# Patient Record
Sex: Female | Born: 1945 | Race: Black or African American | Hispanic: No | Smoking: Former smoker
Health system: Southern US, Community
[De-identification: ages and names within clinical notes are randomized; demographics above are authoritative.]

## PROBLEM LIST (undated history)

## (undated) DIAGNOSIS — Z9289 Personal history of other medical treatment: Secondary | ICD-10-CM

## (undated) DIAGNOSIS — F0390 Unspecified dementia without behavioral disturbance: Secondary | ICD-10-CM

## (undated) DIAGNOSIS — G35D Multiple sclerosis, unspecified: Secondary | ICD-10-CM

## (undated) DIAGNOSIS — I10 Essential (primary) hypertension: Secondary | ICD-10-CM

## (undated) DIAGNOSIS — G35 Multiple sclerosis: Secondary | ICD-10-CM

## (undated) HISTORY — DX: Essential (primary) hypertension: I10

## (undated) HISTORY — PX: TONSILLECTOMY: SUR1361

## (undated) HISTORY — DX: Unspecified dementia, unspecified severity, without behavioral disturbance, psychotic disturbance, mood disturbance, and anxiety: F03.90

## (undated) HISTORY — PX: CYSTECTOMY: SUR359

---

## 1997-05-10 ENCOUNTER — Ambulatory Visit (HOSPITAL_COMMUNITY): Admission: RE | Admit: 1997-05-10 | Discharge: 1997-05-10 | Payer: Self-pay | Admitting: Obstetrics and Gynecology

## 1999-10-16 ENCOUNTER — Encounter: Payer: Self-pay | Admitting: Obstetrics and Gynecology

## 1999-10-16 ENCOUNTER — Ambulatory Visit (HOSPITAL_COMMUNITY): Admission: RE | Admit: 1999-10-16 | Discharge: 1999-10-16 | Payer: Self-pay | Admitting: Obstetrics and Gynecology

## 1999-12-19 ENCOUNTER — Other Ambulatory Visit: Admission: RE | Admit: 1999-12-19 | Discharge: 1999-12-19 | Payer: Self-pay | Admitting: Obstetrics and Gynecology

## 2001-11-17 ENCOUNTER — Encounter: Payer: Self-pay | Admitting: Emergency Medicine

## 2001-11-17 ENCOUNTER — Emergency Department (HOSPITAL_COMMUNITY): Admission: EM | Admit: 2001-11-17 | Discharge: 2001-11-17 | Payer: Self-pay | Admitting: Emergency Medicine

## 2005-09-11 ENCOUNTER — Encounter: Admission: RE | Admit: 2005-09-11 | Discharge: 2005-09-11 | Payer: Self-pay | Admitting: Internal Medicine

## 2005-11-18 ENCOUNTER — Ambulatory Visit: Payer: Self-pay | Admitting: Internal Medicine

## 2005-11-18 LAB — CONVERTED CEMR LAB
ALT: 23 units/L (ref 0–40)
AST: 27 units/L (ref 0–37)
Albumin: 4.1 g/dL (ref 3.5–5.2)
Basophils Absolute: 0.1 10*3/uL (ref 0.0–0.1)
Chloride: 106 meq/L (ref 96–112)
Chol/HDL Ratio, serum: 3.5
Cholesterol: 149 mg/dL (ref 0–200)
Creatinine, Ser: 0.9 mg/dL (ref 0.4–1.2)
Glomerular Filtration Rate, Af Am: 82 mL/min/{1.73_m2}
Glucose, Bld: 97 mg/dL (ref 70–99)
HDL: 42.1 mg/dL (ref >39.0)
Hgb A1c MFr Bld: 5.7 % (ref 4.6–6.0)
Lymphocytes Relative: 36.8 % (ref 12.0–46.0)
MCHC: 33.2 g/dL (ref 30.0–36.0)
MCV: 92.2 fL (ref 78.0–100.0)
Neutro Abs: 3.6 10*3/uL (ref 1.4–7.7)
Platelets: 179 10*3/uL (ref 150–400)
Potassium: 4.4 meq/L (ref 3.5–5.1)
RBC: 4.16 M/uL (ref 3.87–5.11)
Sodium: 141 meq/L (ref 135–145)
Total Bilirubin: 1.4 mg/dL — ABNORMAL HIGH (ref 0.3–1.2)
Triglyceride fasting, serum: 54 mg/dL (ref 0–149)

## 2006-01-02 ENCOUNTER — Ambulatory Visit: Payer: Self-pay

## 2006-02-05 ENCOUNTER — Ambulatory Visit: Payer: Self-pay | Admitting: Internal Medicine

## 2006-09-17 ENCOUNTER — Encounter: Admission: RE | Admit: 2006-09-17 | Discharge: 2006-09-17 | Payer: Self-pay | Admitting: Internal Medicine

## 2006-11-11 ENCOUNTER — Encounter: Payer: Self-pay | Admitting: *Deleted

## 2006-11-11 DIAGNOSIS — I739 Peripheral vascular disease, unspecified: Secondary | ICD-10-CM

## 2006-11-11 DIAGNOSIS — E7800 Pure hypercholesterolemia, unspecified: Secondary | ICD-10-CM | POA: Insufficient documentation

## 2006-11-11 DIAGNOSIS — Z9189 Other specified personal risk factors, not elsewhere classified: Secondary | ICD-10-CM | POA: Insufficient documentation

## 2007-02-25 ENCOUNTER — Telehealth: Payer: Self-pay | Admitting: Internal Medicine

## 2007-11-12 ENCOUNTER — Encounter: Admission: RE | Admit: 2007-11-12 | Discharge: 2007-11-12 | Payer: Self-pay | Admitting: Internal Medicine

## 2008-01-13 ENCOUNTER — Other Ambulatory Visit: Admission: RE | Admit: 2008-01-13 | Discharge: 2008-01-13 | Payer: Self-pay | Admitting: Gynecology

## 2008-01-13 ENCOUNTER — Ambulatory Visit: Payer: Self-pay | Admitting: Women's Health

## 2008-01-13 ENCOUNTER — Encounter: Payer: Self-pay | Admitting: Women's Health

## 2008-05-19 ENCOUNTER — Emergency Department (HOSPITAL_COMMUNITY): Admission: EM | Admit: 2008-05-19 | Discharge: 2008-05-19 | Payer: Self-pay | Admitting: Family Medicine

## 2008-05-23 ENCOUNTER — Emergency Department (HOSPITAL_COMMUNITY): Admission: EM | Admit: 2008-05-23 | Discharge: 2008-05-23 | Payer: Self-pay | Admitting: Emergency Medicine

## 2008-05-29 ENCOUNTER — Emergency Department (HOSPITAL_COMMUNITY): Admission: EM | Admit: 2008-05-29 | Discharge: 2008-05-29 | Payer: Self-pay | Admitting: Emergency Medicine

## 2008-06-04 ENCOUNTER — Emergency Department (HOSPITAL_COMMUNITY): Admission: EM | Admit: 2008-06-04 | Discharge: 2008-06-04 | Payer: Self-pay | Admitting: Family Medicine

## 2008-11-22 ENCOUNTER — Encounter: Admission: RE | Admit: 2008-11-22 | Discharge: 2008-11-22 | Payer: Self-pay | Admitting: Infectious Diseases

## 2009-11-23 ENCOUNTER — Ambulatory Visit (HOSPITAL_COMMUNITY): Admission: RE | Admit: 2009-11-23 | Discharge: 2009-11-23 | Payer: Self-pay | Admitting: Family Medicine

## 2010-02-25 ENCOUNTER — Encounter: Payer: Self-pay | Admitting: Internal Medicine

## 2010-09-05 ENCOUNTER — Other Ambulatory Visit (HOSPITAL_COMMUNITY): Payer: Self-pay | Admitting: Internal Medicine

## 2010-09-05 DIAGNOSIS — Z1231 Encounter for screening mammogram for malignant neoplasm of breast: Secondary | ICD-10-CM

## 2010-11-25 ENCOUNTER — Ambulatory Visit (HOSPITAL_COMMUNITY): Payer: Self-pay | Attending: Internal Medicine

## 2011-12-01 ENCOUNTER — Other Ambulatory Visit: Payer: Self-pay | Admitting: Family Medicine

## 2011-12-01 DIAGNOSIS — Z1231 Encounter for screening mammogram for malignant neoplasm of breast: Secondary | ICD-10-CM

## 2011-12-29 ENCOUNTER — Other Ambulatory Visit: Payer: Self-pay | Admitting: Internal Medicine

## 2011-12-29 ENCOUNTER — Ambulatory Visit
Admission: RE | Admit: 2011-12-29 | Discharge: 2011-12-29 | Disposition: A | Discharge status: Home / Self Care | Payer: Medicare Other | Source: Ambulatory Visit | Attending: Family Medicine | Admitting: Family Medicine

## 2011-12-29 DIAGNOSIS — Z1231 Encounter for screening mammogram for malignant neoplasm of breast: Secondary | ICD-10-CM

## 2012-04-15 ENCOUNTER — Inpatient Hospital Stay (HOSPITAL_COMMUNITY): Payer: Medicare Other

## 2012-04-15 ENCOUNTER — Encounter (HOSPITAL_COMMUNITY): Payer: Self-pay

## 2012-04-15 ENCOUNTER — Inpatient Hospital Stay (HOSPITAL_COMMUNITY)
Admission: EM | Admit: 2012-04-15 | Discharge: 2012-04-21 | DRG: 059 | Disposition: A | Discharge status: Rehabilitation Facility | Payer: Medicare Other | Attending: Internal Medicine | Admitting: Internal Medicine

## 2012-04-15 ENCOUNTER — Emergency Department (HOSPITAL_COMMUNITY): Payer: Medicare Other

## 2012-04-15 DIAGNOSIS — E7800 Pure hypercholesterolemia, unspecified: Secondary | ICD-10-CM

## 2012-04-15 DIAGNOSIS — I739 Peripheral vascular disease, unspecified: Secondary | ICD-10-CM

## 2012-04-15 DIAGNOSIS — G378 Other specified demyelinating diseases of central nervous system: Principal | ICD-10-CM | POA: Diagnosis present

## 2012-04-15 DIAGNOSIS — G35 Multiple sclerosis: Secondary | ICD-10-CM

## 2012-04-15 DIAGNOSIS — G459 Transient cerebral ischemic attack, unspecified: Secondary | ICD-10-CM

## 2012-04-15 DIAGNOSIS — R531 Weakness: Secondary | ICD-10-CM

## 2012-04-15 DIAGNOSIS — R634 Abnormal weight loss: Secondary | ICD-10-CM

## 2012-04-15 DIAGNOSIS — K5939 Other megacolon: Secondary | ICD-10-CM

## 2012-04-15 DIAGNOSIS — R933 Abnormal findings on diagnostic imaging of other parts of digestive tract: Secondary | ICD-10-CM

## 2012-04-15 DIAGNOSIS — Z8673 Personal history of transient ischemic attack (TIA), and cerebral infarction without residual deficits: Secondary | ICD-10-CM

## 2012-04-15 DIAGNOSIS — G379 Demyelinating disease of central nervous system, unspecified: Secondary | ICD-10-CM

## 2012-04-15 DIAGNOSIS — R140 Abdominal distension (gaseous): Secondary | ICD-10-CM

## 2012-04-15 DIAGNOSIS — Z9189 Other specified personal risk factors, not elsewhere classified: Secondary | ICD-10-CM

## 2012-04-15 HISTORY — DX: Personal history of other medical treatment: Z92.89

## 2012-04-15 LAB — DIFFERENTIAL
Basophils Absolute: 0 10*3/uL (ref 0.0–0.1)
Lymphocytes Relative: 32 % (ref 12–46)
Lymphs Abs: 2.6 10*3/uL (ref 0.7–4.0)
Neutro Abs: 4.7 10*3/uL (ref 1.7–7.7)

## 2012-04-15 LAB — URINE MICROSCOPIC-ADD ON

## 2012-04-15 LAB — COMPREHENSIVE METABOLIC PANEL
ALT: 25 U/L (ref 0–35)
AST: 27 U/L (ref 0–37)
Alkaline Phosphatase: 46 U/L (ref 39–117)
CO2: 26 mEq/L (ref 19–32)
Chloride: 108 mEq/L (ref 96–112)
GFR calc Af Amer: 88 mL/min — ABNORMAL LOW (ref >90)
GFR calc non Af Amer: 76 mL/min — ABNORMAL LOW (ref >90)
Glucose, Bld: 83 mg/dL (ref 70–99)
Potassium: 4.4 mEq/L (ref 3.5–5.1)
Sodium: 142 mEq/L (ref 135–145)
Total Bilirubin: 0.4 mg/dL (ref 0.3–1.2)

## 2012-04-15 LAB — CBC
MCV: 92.7 fL (ref 78.0–100.0)
Platelets: 181 10*3/uL (ref 150–400)
RBC: 3.55 MIL/uL — ABNORMAL LOW (ref 3.87–5.11)
RDW: 14 % (ref 11.5–15.5)
WBC: 8 10*3/uL (ref 4.0–10.5)

## 2012-04-15 LAB — URINALYSIS, ROUTINE W REFLEX MICROSCOPIC
Bilirubin Urine: NEGATIVE
Glucose, UA: NEGATIVE mg/dL
Hgb urine dipstick: NEGATIVE
Protein, ur: NEGATIVE mg/dL

## 2012-04-15 LAB — APTT: aPTT: 31 seconds (ref 24–37)

## 2012-04-15 LAB — PROTIME-INR: Prothrombin Time: 12.7 seconds (ref 11.6–15.2)

## 2012-04-15 LAB — GLUCOSE, CAPILLARY: Glucose-Capillary: 78 mg/dL (ref 70–99)

## 2012-04-15 LAB — TSH: TSH: 3.985 u[IU]/mL (ref 0.350–4.500)

## 2012-04-15 LAB — VITAMIN B12: Vitamin B-12: 493 pg/mL (ref 211–911)

## 2012-04-15 MED ORDER — SODIUM CHLORIDE 0.9 % IV SOLN
INTRAVENOUS | Status: DC
  Administered 2012-04-16 – 2012-04-17 (×2): via INTRAVENOUS

## 2012-04-15 MED ORDER — SODIUM CHLORIDE 0.9 % IV SOLN: INTRAVENOUS | Status: AC

## 2012-04-15 MED ORDER — ASPIRIN 325 MG PO TABS
325.0000 mg | ORAL_TABLET | Freq: Every day | ORAL | Status: DC
  Administered 2012-04-16 – 2012-04-21 (×5): 325 mg via ORAL
  Filled 2012-04-15 (×6): qty 1

## 2012-04-15 MED ORDER — ENOXAPARIN SODIUM 40 MG/0.4ML ~~LOC~~ SOLN
40.0000 mg | SUBCUTANEOUS | Status: DC
  Administered 2012-04-16 – 2012-04-18 (×3): 40 mg via SUBCUTANEOUS
  Filled 2012-04-15 (×3): qty 0.4

## 2012-04-15 NOTE — H&P (Signed)
[facility] History and Physical  Michelle Hodges UJW:[id number] DOB: 08-Nov-1945 DOA: 04/15/2012  Referring physician: ER PCP: Oliver Barre, MD   Chief Complaint: CVA HPI:  67 year old female who presents to the ER via EMS after co-workers were concerned about her peculiar behavior/weakness. Daughter reports that the patient has had worsening of her right lower extremity weakness that originally had an acute onset 2 years ago, but was never evaluated for and is described as dragging leg with ambulation. She's never had a stroke. In addition patient has been dropping things more than usual with hands, also right-sided. Today at work patient was noted to have an episode of disequilibrium and facial droop which has since resolved. Patient has never been evaluated by a cardiologist or a neurologist and daughter reports that they have made her appointments however she prefers herbal medicines and is non-compliant with appointments. She's also lost 30 pounds and has never had a colonoscopy. She feels that her abdomen is bloated, she has had diarrhea alternating with constipation. Patient denies any current change in vision, dizziness, chest pain, shortness of breath, dyspnea on exertion, decreased coordination.       Review of Systems: negative for the following  Constitutional: Denies fever, chills, diaphoresis, appetite change and fatigue.  HEENT: Denies photophobia, eye pain, redness, hearing loss, ear pain, congestion, sore throat, rhinorrhea, sneezing, mouth sores, trouble swallowing, neck pain, neck stiffness and tinnitus.  Respiratory: Denies SOB, DOE, cough, chest tightness, and wheezing.  Cardiovascular: Denies chest pain, palpitations and leg swelling.  Gastrointestinal: Denies nausea, vomiting, abdominal pain, diarrhea, constipation, blood in stool and abdominal distention.  Genitourinary: Denies dysuria, urgency, frequency, hematuria, flank pain and difficulty urinating.   Musculoskeletal: Denies myalgias, back pain, joint swelling, arthralgias and gait problem.  Skin: Denies pallor, rash and wound.  Neurological: Positive for facial asymmetry (resolved) and weakness. Negative for dizziness, syncope, light-headedness, numbness and headaches Hematological: Denies adenopathy. Easy bruising, personal or family bleeding history  Psychiatric/Behavioral: Denies suicidal ideation, mood changes, confusion, nervousness, sleep disturbance and agitation       Past Medical History  Diagnosis Date  . History of blood transfusion      Past Surgical History  Procedure Laterality Date  . Tonsillectomy        Social History:  reports that she has never smoked. She does not have any smokeless tobacco history on file. She reports that  drinks alcohol. She reports that she uses illicit drugs (Marijuana).    No Known Allergies  No family history on file.   Prior to Admission medications   Not on File     Physical Exam: Filed Vitals:   04/15/12 1355 04/15/12 1400 04/15/12 1430 04/15/12 1500  BP:      Pulse: 48 50 54 51  Temp:      Resp:  15 15 18   Height:      Weight:      SpO2: 100% 100% 100% 100%     Constitutional: Vital signs reviewed. Patient is a well-developed and well-nourished in no acute distress and cooperative with exam. Alert and oriented x3.  Patient not in visible distress  HENT:  Head: Normocephalic and atraumatic.  Eyes: Conjunctivae and EOM are normal. Pupils are equal, round, and reactive to light.  Neck: Normal range of motion. Neck supple. Normal carotid pulses, no hepatojugular reflux and no JVD present. Carotid bruit is not present.  No carotid bruits  Cardiovascular:  RRR, no aberrancy on auscultation, intact distal pulses no pitting  edema bilaterally.  Pulmonary/Chest: Effort normal and breath sounds normal. No respiratory distress.  Lungs clear to auscultation bilaterally  Abdominal: Soft. She exhibits no distension.  There is no tenderness.  Nonpulsatile aorta  Musculoskeletal: Normal range of motion. She exhibits no tenderness.  Neurological: She is alert and oriented to person, place, and time. She displays a negative Romberg sign.  Cranial nerves II through XII intact, normal coordination, , strength 5/5 bilaterally.  Skin: Skin is warm and dry. No pallor.  Psychiatric: She has a normal mood and affect. Her behavior is normal.        Labs on Admission:    Basic Metabolic Panel:  Recent Labs Lab 04/15/12 1345  NA 142  K 4.4  CL 108  CO2 26  GLUCOSE 83  BUN 16  CREATININE 0.80  CALCIUM 9.3   Liver Function Tests:  Recent Labs Lab 04/15/12 1345  AST 27  ALT 25  ALKPHOS 46  BILITOT 0.4  PROT 6.2  ALBUMIN 3.4*   No results found for this basename: LIPASE, AMYLASE,  in the last 168 hours No results found for this basename: AMMONIA,  in the last 168 hours CBC:  Recent Labs Lab 04/15/12 1345  WBC 8.0  NEUTROABS 4.7  HGB 10.9*  HCT 32.9*  MCV 92.7  PLT 181   Cardiac Enzymes:  Recent Labs Lab 04/15/12 1346  TROPONINI <0.30    BNP (last 3 results) No results found for this basename: PROBNP,  in the last 8760 hours    CBG:  Recent Labs Lab 04/15/12 1316  GLUCAP 78    Radiological Exams on Admission: Ct Head Wo Contrast  04/15/2012  *RADIOLOGY REPORT*  Clinical Data: Fall with head trauma.  Acting strange.  CT HEAD WITHOUT CONTRAST  Technique:  Contiguous axial images were obtained from the base of the skull through the vertex without contrast.  Comparison: None.  Findings: There are patchy and confluent areas of low attenuation in the periventricular deep white matter.  No evidence of acute hemorrhage, mass lesion, mass effect or hydrocephalus.  Scattered mucosal thickening or opacification of the paranasal sinuses. Mastoid air cells are clear.  IMPRESSION: Patchy and confluent areas of low attenuation in the periventricular deep white matter.  Findings are  likely due to chronic microvascular white matter ischemic changes. Focal area of acute or subacute infarction cannot be definitively excluded.   Original Report Authenticated By: Leanna Battles, M.D.     EKG: Independently reviewed.    Assessment/Plan   1. Probable CVA MRI done results pending at this time Admit for a full stroke workup 2-D echo Carotid Doppler PT OT evaluation We'll start the patient on aspirin   #2 weight loss-recommend CT scan tomorrow after the patient has received some IV hydration she needs a CT scan of the abdomen and pelvis and possibly referral for an outpatient colonoscopy   Code Status:   full Family Communication: bedside Disposition Plan: admit   Time spent: 70 mins   [facility] Pager [phone]  If 7PM-7AM, please contact night-coverage [email or web address] Password [facility] 04/15/2012, 5:37 PM

## 2012-04-15 NOTE — ED Notes (Signed)
According to patients daughter, a pan fell on pt's head on Saturday- 6 days ago. Today at work, pt's coworkers noticed patient not her normal self.

## 2012-04-15 NOTE — Consult Note (Addendum)
Referring Physician: Dr. Radford Pax    Chief Complaint: dizziness, right sided weakness  HPI: Michelle Hodges is an 67 y.o. female who has had right sided weakness and a limp for 2 years according to family. Upon awakening today at 0630, she noted that she had significant weakness of right side "draggin" her leg. She has had dizziness for about 2 weeks that has progressed in severity. About a week ago, she did hit her head on some type of pot in the kitchen, but she did have symptoms prior to that time. Her family has been trying to get her to see primary MD but she has resisted.   Date last known well: Patient had increased weakness of left side upon awakening 04/15/2012 at 0630, but has been symptomatic for weeks. Time last known well: Unable to determine  tPA Given: No: unable to determine   Past Medical History  Diagnosis Date  . History of blood transfusion     Past Surgical History  Procedure Laterality Date  . Tonsillectomy      Family History: strokes parents  Social History:  Patient denies tobacco, alcohol or illicit drug use.  Allergies: no known drug allergies  Current Facility-Administered Medications  Medication Dose Route Frequency Provider Last Rate Last Dose  . 0.9 %  sodium chloride infusion   Intravenous Continuous Richarda Overlie, MD       No current outpatient prescriptions on file.     ROS: History obtained from child and the patient  General ROS: negative for - chills, fatigue, fever, night sweats, weight gain or weight loss Psychological ROS: negative for - behavioral disorder, hallucinations, memory difficulties, mood swings or suicidal ideation Ophthalmic ROS: negative for - blurry vision, double vision, eye pain or loss of vision ENT ROS: negative for - epistaxis, nasal discharge, oral lesions, sore throat, tinnitus or vertigo Allergy and Immunology ROS: negative for - hives or itchy/watery eyes Hematological and Lymphatic ROS: negative for - bleeding  problems, bruising or swollen lymph nodes Endocrine ROS: negative for - galactorrhea, hair pattern changes, polydipsia/polyuria or temperature intolerance Respiratory ROS: negative for - cough, hemoptysis, shortness of breath or wheezing Cardiovascular ROS: negative for - chest pain, dyspnea on exertion, edema or irregular heartbeat Gastrointestinal ROS: negative for - abdominal pain, diarrhea, hematemesis, nausea/vomiting or stool incontinence Genito-Urinary ROS: negative for - dysuria, hematuria, incontinence or urinary frequency/urgency Musculoskeletal ROS: negative for - joint swelling or muscular weakness Neurological ROS: as noted in HPI Dermatological ROS: negative for rash and skin lesion changes   Physical Examination: Blood pressure 139/97, pulse 51, temperature 98 F (36.7 C), resp. rate 18, height 5\' 8"  (1.727 m), weight 56.7 kg (125 lb), SpO2 100.00%.  Neurologic Examination: Mental Status: Alert, oriented, thought content appropriate.  Speech fluent without evidence of aphasia.  Able to follow 3 step commands without difficulty. Cranial Nerves: II: visual fields grossly normal, pupils equal, round, reactive to light and accommodation III,IV, VI: ptosis not present, extra-ocular motions intact bilaterally V,VII: smile symmetric, facial light touch sensation normal bilaterally VIII: hearing normal bilaterally IX,X: gag reflex present XI: trapezius strength/neck flexion strength normal bilaterally XII: tongue strength normal  Motor: Right : Upper extremity   5-/5    Left:     Upper extremity   5/5  Lower extremity   4-/5     Lower extremity   5/5 Tone and bulk:normal tone throughout; no atrophy noted Sensory: Pinprick and light touch intact throughout, bilaterally Deep Tendon Reflexes: 2+ throughout with absent AJ's  bilaterally Plantars: Right: upoing   Left: upgoing Cerebellar: normal finger-to-nose   Results for orders placed during the hospital encounter of  04/15/12 (from the past 48 hour(s))  GLUCOSE, CAPILLARY     Status: None   Collection Time    04/15/12  1:16 PM      Result Value Range   Glucose-Capillary 78  70 - 99 mg/dL  PROTIME-INR     Status: None   Collection Time    04/15/12  1:45 PM      Result Value Range   Prothrombin Time 12.7  11.6 - 15.2 seconds   INR 0.96  0.00 - 1.49  APTT     Status: None   Collection Time    04/15/12  1:45 PM      Result Value Range   aPTT 31  24 - 37 seconds  CBC     Status: Abnormal   Collection Time    04/15/12  1:45 PM      Result Value Range   WBC 8.0  4.0 - 10.5 K/uL   RBC 3.55 (*) 3.87 - 5.11 MIL/uL   Hemoglobin 10.9 (*) 12.0 - 15.0 g/dL   HCT 40.9 (*) 81.1 - 91.4 %   MCV 92.7  78.0 - 100.0 fL   MCH 30.7  26.0 - 34.0 pg   MCHC 33.1  30.0 - 36.0 g/dL   RDW 78.2  95.6 - 21.3 %   Platelets 181  150 - 400 K/uL  DIFFERENTIAL     Status: None   Collection Time    04/15/12  1:45 PM      Result Value Range   Neutrophils Relative 59  43 - 77 %   Neutro Abs 4.7  1.7 - 7.7 K/uL   Lymphocytes Relative 32  12 - 46 %   Lymphs Abs 2.6  0.7 - 4.0 K/uL   Monocytes Relative 5  3 - 12 %   Monocytes Absolute 0.4  0.1 - 1.0 K/uL   Eosinophils Relative 4  0 - 5 %   Eosinophils Absolute 0.3  0.0 - 0.7 K/uL   Basophils Relative 0  0 - 1 %   Basophils Absolute 0.0  0.0 - 0.1 K/uL  COMPREHENSIVE METABOLIC PANEL     Status: Abnormal   Collection Time    04/15/12  1:45 PM      Result Value Range   Sodium 142  135 - 145 mEq/L   Potassium 4.4  3.5 - 5.1 mEq/L   Chloride 108  96 - 112 mEq/L   CO2 26  19 - 32 mEq/L   Glucose, Bld 83  70 - 99 mg/dL   BUN 16  6 - 23 mg/dL   Creatinine, Ser 0.86  0.50 - 1.10 mg/dL   Calcium 9.3  8.4 - 57.8 mg/dL   Total Protein 6.2  6.0 - 8.3 g/dL   Albumin 3.4 (*) 3.5 - 5.2 g/dL   AST 27  0 - 37 U/L   ALT 25  0 - 35 U/L   Alkaline Phosphatase 46  39 - 117 U/L   Total Bilirubin 0.4  0.3 - 1.2 mg/dL   GFR calc non Af Amer 76 (*) >90 mL/min   GFR calc Af Amer 88  (*) >90 mL/min   Comment:            The eGFR has been calculated     using the CKD EPI equation.     This calculation has not  been     validated in all clinical     situations.     eGFR's persistently     <90 mL/min signify     possible Chronic Kidney Disease.  TROPONIN I     Status: None   Collection Time    04/15/12  1:46 PM      Result Value Range   Troponin I <0.30  <0.30 ng/mL   Comment:            Due to the release kinetics of cTnI,     a negative result within the first hours     of the onset of symptoms does not rule out     myocardial infarction with certainty.     If myocardial infarction is still suspected,     repeat the test at appropriate intervals.  URINALYSIS, ROUTINE W REFLEX MICROSCOPIC     Status: Abnormal   Collection Time    04/15/12  2:28 PM      Result Value Range   Color, Urine YELLOW  YELLOW   APPearance CLEAR  CLEAR   Specific Gravity, Urine 1.010  1.005 - 1.030   pH 6.0  5.0 - 8.0   Glucose, UA NEGATIVE  NEGATIVE mg/dL   Hgb urine dipstick NEGATIVE  NEGATIVE   Bilirubin Urine NEGATIVE  NEGATIVE   Ketones, ur NEGATIVE  NEGATIVE mg/dL   Protein, ur NEGATIVE  NEGATIVE mg/dL   Urobilinogen, UA 0.2  0.0 - 1.0 mg/dL   Nitrite NEGATIVE  NEGATIVE   Leukocytes, UA TRACE (*) NEGATIVE  URINE MICROSCOPIC-ADD ON     Status: None   Collection Time    04/15/12  2:28 PM      Result Value Range   Squamous Epithelial / LPF RARE  RARE   WBC, UA 0-2  <3 WBC/hpf   Ct Head Wo Contrast  04/15/2012  *RADIOLOGY REPORT*  Clinical Data: Fall with head trauma.  Acting strange.  CT HEAD WITHOUT CONTRAST  Technique:  Contiguous axial images were obtained from the base of the skull through the vertex without contrast.  Comparison: None.  Findings: There are patchy and confluent areas of low attenuation in the periventricular deep white matter.  No evidence of acute hemorrhage, mass lesion, mass effect or hydrocephalus.  Scattered mucosal thickening or opacification of the  paranasal sinuses. Mastoid air cells are clear.  IMPRESSION: Patchy and confluent areas of low attenuation in the periventricular deep white matter.  Findings are likely due to chronic microvascular white matter ischemic changes. Focal area of acute or subacute infarction cannot be definitively excluded.   Original Report Authenticated By: Leanna Battles, M.D.    Job Founds, MBA, MHA [facility] Pager [phone]  Patient seen and examined.  Clinical course and management discussed.  Necessary edits performed.  I agree with the above.  Assessment and plan of care developed and discussed below.     Assessment: 67 y.o. female presenting with right sided complaints that have spanned about 2 years.  There has been some recent worsening but after further conversation it is unclear if all of this actually happened just today.  After review of the MRI, again this does not show an acute event.  Patient does clearly have some ischemic disease, though and has not sought for work up in the past.  Would likely benefit from a full work up.    Stroke Risk Factors - none  Plan: 1. HgbA1c, fasting lipid panel 2. PT consult, OT consult,  Speech consult 3. Echocardiogram 4. Carotid dopplers 5. Prophylactic therapy-ASA 325mg  daily 6. Risk factor modification 7. Telemetry monitoring 8. Frequent neuro checks   Thana Farr, MD [facility] [phone]  04/15/2012  7:05 PM

## 2012-04-15 NOTE — ED Provider Notes (Signed)
History     CSN: [id number]  Arrival date & time 04/15/12  1233   First MD Initiated Contact with Patient 04/15/12 1256      Chief Complaint  Patient presents with  . Weakness    (Consider location/radiation/quality/duration/timing/severity/associated sxs/prior treatment) HPI Comments: Ms. Michelle Hodges is a 67 year old female who presents to the ER via EMS after co-workers were concerned about her peculiar behavior/weakness. Hx obtained from both daughter and pt who appear to be reliable.  They report that the patient has had gradually worsening right lower extremity weakness that originally had an acute onset 2 years ago, but was never evaluated for and is described as dragging leg with ambulation.  In addition patient has been dropping things more than usual with hands, also right-sided.  Today at work patient was noted to have an episode of  disequilibrium and facial droop which has since resolved.  Patient has never been evaluated by a cardiologist or a neurologist and daughter reports that they have made her appointments however she prefers herbal medicines and is non-compliant with appointments.  Patient denies any current change in vision, dizziness, chest pain, shortness of breath, dyspnea on exertion, decreased coordination.  Patient is a 67 y.o. female presenting with weakness.  Weakness Associated symptoms include weakness. Pertinent negatives include no abdominal pain, chest pain, chills, congestion, fever, headaches or numbness.    Past Medical History  Diagnosis Date  . History of blood transfusion     Past Surgical History  Procedure Laterality Date  . Tonsillectomy      No family history on file.  History  Substance Use Topics  . Smoking status: Never Smoker   . Smokeless tobacco: Not on file  . Alcohol Use: Yes     Comment: Wine Occassionally    OB History   Grav Para Term Preterm Abortions TAB SAB Ect Mult Living                  Review of Systems   Constitutional: Negative for fever, chills and appetite change.  HENT: Negative for congestion.   Eyes: Negative for visual disturbance.  Respiratory: Negative for shortness of breath.   Cardiovascular: Negative for chest pain and leg swelling.  Gastrointestinal: Negative for abdominal pain.  Genitourinary: Negative for dysuria, urgency and frequency.  Neurological: Positive for facial asymmetry (resolved) and weakness. Negative for dizziness, syncope, light-headedness, numbness and headaches.  Psychiatric/Behavioral: Negative for confusion.  All other systems reviewed and are negative.    Allergies  Review of patient's allergies indicates no known allergies.  Home Medications  No current outpatient prescriptions on file.  BP 119/84  Pulse 53  Temp(Src) 98 F (36.7 C)  Resp 14  Ht 5\' 8"  (1.727 m)  Wt 125 lb (56.7 kg)  BMI 19.01 kg/m2  SpO2 100%  Physical Exam  Nursing note and vitals reviewed. Constitutional: She is oriented to person, place, and time. She appears well-developed and well-nourished. No distress.  Patient not in visible distress  HENT:  Head: Normocephalic and atraumatic.  Eyes: Conjunctivae and EOM are normal. Pupils are equal, round, and reactive to light.  Neck: Normal range of motion. Neck supple. Normal carotid pulses, no hepatojugular reflux and no JVD present. Carotid bruit is not present.  No carotid bruits  Cardiovascular:  RRR, no aberrancy on auscultation, intact distal pulses no pitting edema bilaterally.  Pulmonary/Chest: Effort normal and breath sounds normal. No respiratory distress.  Lungs clear to auscultation bilaterally  Abdominal: Soft. She exhibits  no distension. There is no tenderness.  Nonpulsatile aorta  Musculoskeletal: Normal range of motion. She exhibits no tenderness.  Neurological: She is alert and oriented to person, place, and time. She displays a negative Romberg sign.  Cranial nerves II through XII intact, normal  coordination, , strength 5/5 bilaterally.  Skin: Skin is warm and dry. No pallor.  Psychiatric: She has a normal mood and affect. Her behavior is normal.    ED Course  Procedures (including critical care time)  Labs Reviewed  GLUCOSE, CAPILLARY  PROTIME-INR  APTT  CBC  DIFFERENTIAL  COMPREHENSIVE METABOLIC PANEL  TROPONIN I  URINALYSIS, ROUTINE W REFLEX MICROSCOPIC   No results found.   No diagnosis found.   Date: 04/15/2012  Rate: 51  Rhythm: normal sinus rhythm  QRS Axis: normal  Intervals: normal  ST/T Wave abnormalities: normal  Conduction Disutrbances: none  Narrative Interpretation:   Old EKG Reviewed: No old ECG     MDM  Patient is a 67 year old female with no significant past medical history that presents emergency department after coworkers believed her to be behaving peculiar with change in ambulation and facial droop.  Patient has never been evaluated by a cardiologist or a neurologist.  Patient's daughter reports that mother has been walking with a right lower from a foot drag for approximately one year and she believes that her mother possibly had a stroke, however she was never evaluated for it.  Based on his story my concern is that patient may have had an ischemic event a year ago and then recently a TIA that has since resolved.  It is my recommendation that the patient is admitted for full workup including carotid Dopplers MRI/MRA.  Patient and daughter are both agreeable to plan.  Case discussed with Dr. be in attending who also agrees with disposition.The patient appears reasonably stabilized for admission considering the current resources, flow, and capabilities available in the ED at this time, and I doubt any other [facility] requiring further screening and/or treatment in the ED prior to admission.        Jaci Carrel, [place] 04/15/12 1512

## 2012-04-15 NOTE — ED Notes (Signed)
Patient transported to MRI 

## 2012-04-15 NOTE — ED Notes (Addendum)
PA- Lisette Paz at bedside.

## 2012-04-15 NOTE — ED Provider Notes (Signed)
Medical screening examination/treatment/procedure(s) were conducted as a shared visit with non-physician practitioner(s) and myself.  I personally evaluated the patient during the encounter   Nelia Shi, MD 04/15/12 1517

## 2012-04-15 NOTE — ED Notes (Addendum)
According to EMS pt fell on ice approximately 1 week ago. Today at work, right leg weakness and left sided facial droop.

## 2012-04-16 DIAGNOSIS — M6281 Muscle weakness (generalized): Secondary | ICD-10-CM

## 2012-04-16 DIAGNOSIS — R531 Weakness: Secondary | ICD-10-CM

## 2012-04-16 DIAGNOSIS — Z8673 Personal history of transient ischemic attack (TIA), and cerebral infarction without residual deficits: Secondary | ICD-10-CM

## 2012-04-16 DIAGNOSIS — G379 Demyelinating disease of central nervous system, unspecified: Secondary | ICD-10-CM

## 2012-04-16 DIAGNOSIS — G35 Multiple sclerosis: Secondary | ICD-10-CM

## 2012-04-16 DIAGNOSIS — I369 Nonrheumatic tricuspid valve disorder, unspecified: Secondary | ICD-10-CM

## 2012-04-16 LAB — LIPID PANEL
Cholesterol: 154 mg/dL (ref 0–200)
HDL: 56 mg/dL (ref >39)
Total CHOL/HDL Ratio: 2.8 RATIO
Triglycerides: 71 mg/dL (ref <150)

## 2012-04-16 LAB — RAPID URINE DRUG SCREEN, HOSP PERFORMED
Amphetamines: NOT DETECTED
Barbiturates: NOT DETECTED
Tetrahydrocannabinol: POSITIVE — AB

## 2012-04-16 LAB — HEMOGLOBIN A1C
Hgb A1c MFr Bld: 5.6 % (ref <5.7)
Mean Plasma Glucose: 114 mg/dL (ref <117)

## 2012-04-16 MED ORDER — PANTOPRAZOLE SODIUM 40 MG IV SOLR
40.0000 mg | INTRAVENOUS | Status: DC
  Administered 2012-04-16 – 2012-04-17 (×2): 40 mg via INTRAVENOUS
  Filled 2012-04-16 (×4): qty 40

## 2012-04-16 MED ORDER — SODIUM CHLORIDE 0.9 % IV SOLN
1000.0000 mg | INTRAVENOUS | Status: DC
  Administered 2012-04-16 – 2012-04-19 (×4): 1000 mg via INTRAVENOUS
  Filled 2012-04-16 (×5): qty 8

## 2012-04-16 NOTE — Progress Notes (Addendum)
Subjective: Patient awake and alert.  No new complaints.  Reports that she is still not at baseline.  After review of the MRI of the cervical spine patient noted to have multiple areas of focal cord abnormality.  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  Spent 60 minutes in conversation with the patient and family discussing process, further treatment plans, diagnosis and prognosis.  At this time patient does not consent to an LP. Echocardiogram and carotid doppler are unremarkable.  LDL 84.  A1c normal at 5.6.  Objective: Current vital signs: BP 127/67  Pulse 54  Temp(Src) 98.3 F (36.8 C) (Oral)  Resp 16  Ht 5\' 8"  (1.727 m)  Wt 56.7 kg (125 lb)  BMI 19.01 kg/m2  SpO2 100% Vital signs in last 24 hours: Temp:  [98 F (36.7 C)-98.3 F (36.8 C)] 98.3 F (36.8 C) (03/14 0631) Pulse Rate:  [54-65] 54 (03/14 0740) Resp:  [16] 16 (03/14 0740) BP: (118-132)/(67-81) 127/67 mmHg (03/14 0740) SpO2:  [99 %-100 %] 100 % (03/14 0740)  Intake/Output from previous day: 03/13 0701 - 03/14 0700 In: 240 [P.O.:240] Out: -  Intake/Output this shift:   Nutritional status: Cardiac  Neurologic Exam: Mental Status:  Alert, oriented, thought content appropriate. Speech fluent without evidence of aphasia. Able to follow 3 step commands without difficulty.  Cranial Nerves:  II: visual fields grossly normal, pupils equal, round, reactive to light and accommodation  III,IV, VI: ptosis not present, extra-ocular motions intact bilaterally  V,VII: smile symmetric, facial light touch sensation normal bilaterally  VIII: hearing normal bilaterally  IX,X: gag reflex present  XI: trapezius strength/neck flexion strength normal bilaterally  XII: tongue strength normal  Motor:  Right : Upper extremity 5-/5          Left: Upper extremity 5/5   Lower extremity 4-/5       Lower extremity 5/5  Tone and bulk:normal tone throughout; no atrophy noted   Sensory: Pinprick and light touch intact throughout, bilaterally  Deep Tendon Reflexes: 2+ throughout with absent AJ's bilaterally  Plantars:  Right: upoing    Left: upgoing  Cerebellar:  normal finger-to-nose   Lab Results: Basic Metabolic Panel:  Recent Labs Lab 04/15/12 1345  NA 142  K 4.4  CL 108  CO2 26  GLUCOSE 83  BUN 16  CREATININE 0.80  CALCIUM 9.3    Liver Function Tests:  Recent Labs Lab 04/15/12 1345  AST 27  ALT 25  ALKPHOS 46  BILITOT 0.4  PROT 6.2  ALBUMIN 3.4*   No results found for this basename: LIPASE, AMYLASE,  in the last 168 hours No results found for this basename: AMMONIA,  in the last 168 hours  CBC:  Recent Labs Lab 04/15/12 1345  WBC 8.0  NEUTROABS 4.7  HGB 10.9*  HCT 32.9*  MCV 92.7  PLT 181    Cardiac Enzymes:  Recent Labs Lab 04/15/12 1346  TROPONINI <0.30    Lipid Panel:  Recent Labs Lab 04/16/12 0529  CHOL 154  TRIG 71  HDL 56  CHOLHDL 2.8  VLDL 14  LDLCALC 84    CBG:  Recent Labs Lab 04/15/12 1316  GLUCAP 78    Microbiology: No results found for this or any previous visit.  Coagulation Studies:  Recent Labs  04/15/12 1345  LABPROT 12.7  INR 0.96    Imaging: Ct Head Wo Contrast  04/15/2012  *RADIOLOGY REPORT*  Clinical Data: Fall with  head trauma.  Acting strange.  CT HEAD WITHOUT CONTRAST  Technique:  Contiguous axial images were obtained from the base of the skull through the vertex without contrast.  Comparison: None.  Findings: There are patchy and confluent areas of low attenuation in the periventricular deep white matter.  No evidence of acute hemorrhage, mass lesion, mass effect or hydrocephalus.  Scattered mucosal thickening or opacification of the paranasal sinuses. Mastoid air cells are clear.  IMPRESSION: Patchy and confluent areas of low attenuation in the periventricular deep white matter.  Findings are likely due to chronic microvascular white matter ischemic changes. Focal  area of acute or subacute infarction cannot be definitively excluded.   Original Report Authenticated By: Leanna Battles, M.D.    Mr [facility] Wo Contrast  04/15/2012  *RADIOLOGY REPORT*  Clinical Data:  Right-sided weakness.  Episode of disequilibrium and facial droop at work today.  MRI HEAD WITHOUT CONTRAST MRA HEAD WITHOUT CONTRAST  Technique:  Multiplanar, multiecho pulse sequences of the brain and surrounding structures were obtained without intravenous contrast. Angiographic images of the head were obtained using MRA technique without contrast.  Comparison:  CT head without contrast 04/15/2012.  MRI HEAD  Findings:  The diffusion weighted images demonstrate no areas of focal restricted diffusion to suggest acute or subacute infarction. Signal in the posterior right frontal lobe is compatible with remote infarct and T2 shine through.  Extensive periventricular subcortical white matter changes are evident bilaterally. Remote lacunar infarcts are present in the cerebellum.  A remote subcortical infarct is present in the posterior left frontal or parietal lobe.  Flow is present in the major intracranial arteries.  The globes and orbits are intact.  A fluid level is present in the left maxillary sinus.  Mild circumferential mucosal thickening is present in the maxillary sinuses bilaterally.  There is scattered mucosal disease throughout the ethmoid air cells.  A large polyp or mucous retention cyst is present in the left maxillary sinus.  The mastoid air cells are clear.  IMPRESSION:  1.  No evidence for acute or subacute infarction. 2.  Diffuse white matter disease compatible with chronic microvascular ischemia. 3.  Remote bilateral cortical and subcortical infarcts. 4.  Moderate sinus disease with evidence for acute sinusitis in the left maxillary sinus.  MRA HEAD  Findings: The internal carotid arteries are within normal limits from the high cervical segments through the ICA termini bilaterally.  The A1 and  M1 segments are normal.  The anterior communicating artery is patent.  MCA bifurcations are normal bilaterally.  Mild small vessel disease is less than expected for the extent of white matter change.  The vertebral arteries are codominant.  The left PICA origin is visualized and within normal limits.  The basilar artery is normal. Both posterior cerebral arteries originate from the basilar tip. There is mild attenuation of PCA branch vessels.  IMPRESSION:  1.  Mild distal small vessel disease. 2.  No significant proximal stenosis, aneurysm, or branch vessel occlusion.   Original Report Authenticated By: Marin Roberts, M.D.    Mr Brain Wo Contrast  04/15/2012  *RADIOLOGY REPORT*  Clinical Data:  Right-sided weakness.  Episode of disequilibrium and facial droop at work today.  MRI HEAD WITHOUT CONTRAST MRA HEAD WITHOUT CONTRAST  Technique:  Multiplanar, multiecho pulse sequences of the brain and surrounding structures were obtained without intravenous contrast. Angiographic images of the head were obtained using MRA technique without contrast.  Comparison:  CT head without contrast 04/15/2012.  MRI HEAD  Findings:  The diffusion weighted images demonstrate no areas of focal restricted diffusion to suggest acute or subacute infarction. Signal in the posterior right frontal lobe is compatible with remote infarct and T2 shine through.  Extensive periventricular subcortical white matter changes are evident bilaterally. Remote lacunar infarcts are present in the cerebellum.  A remote subcortical infarct is present in the posterior left frontal or parietal lobe.  Flow is present in the major intracranial arteries.  The globes and orbits are intact.  A fluid level is present in the left maxillary sinus.  Mild circumferential mucosal thickening is present in the maxillary sinuses bilaterally.  There is scattered mucosal disease throughout the ethmoid air cells.  A large polyp or mucous retention cyst is present in  the left maxillary sinus.  The mastoid air cells are clear.  IMPRESSION:  1.  No evidence for acute or subacute infarction. 2.  Diffuse white matter disease compatible with chronic microvascular ischemia. 3.  Remote bilateral cortical and subcortical infarcts. 4.  Moderate sinus disease with evidence for acute sinusitis in the left maxillary sinus.  MRA HEAD  Findings: The internal carotid arteries are within normal limits from the high cervical segments through the ICA termini bilaterally.  The A1 and M1 segments are normal.  The anterior communicating artery is patent.  MCA bifurcations are normal bilaterally.  Mild small vessel disease is less than expected for the extent of white matter change.  The vertebral arteries are codominant.  The left PICA origin is visualized and within normal limits.  The basilar artery is normal. Both posterior cerebral arteries originate from the basilar tip. There is mild attenuation of PCA branch vessels.  IMPRESSION:  1.  Mild distal small vessel disease. 2.  No significant proximal stenosis, aneurysm, or branch vessel occlusion.   Original Report Authenticated By: Marin Roberts, M.D.    Mr Cervical Spine Wo Contrast  04/16/2012  *RADIOLOGY REPORT*  Clinical Data: Right-sided upper and lower extremity weakness. Episode of disequilibrium at work today.  MRI CERVICAL SPINE WITHOUT CONTRAST  Technique:  Multiplanar and multiecho pulse sequences of the cervical spine, to include the craniocervical junction and cervicothoracic junction, were obtained according to standard protocol without intravenous contrast.  Comparison: MRI brain from the same day.  Findings: Multiple areas of cord signal abnormality are evident.  A 10 mm far right lateral segment is noted at the level of C3.  More posteriorly in the right dorsal column is an area of T2 hyperintensity at C4, measuring 11 mm cephalocaudad.  A short segment is seen along the far left lateral aspect of the cord at C4- 5.   Marrow signal is somewhat heterogeneous without a discrete lesion. There is straightening of the normal cervical lordosis.  The craniocervical junction is within normal limits.  Sinus disease is again noted.  C2-3:  Negative.  C3-4:  Mild uncovertebral disease is present.  A disc osteophyte complex contributes to mild central and left foraminal narrowing.  C4-5:  A focal central disc protrusion contacts the ventral surface the cord.  The foramina are patent bilaterally.  C5-6:  Moderate uncovertebral disease is evident bilaterally.  This results in moderate left and mild right foraminal stenosis.  C6-7:  A broad-based disc osteophyte complex is evident. Uncovertebral disease is noted bilaterally.  Mild to moderate central and bilateral foraminal stenosis is evident.  C7-T1:  Left-sided uncovertebral disease results in mild left foraminal stenosis.  The central canal is patent.  IMPRESSION:  1.  At least  three and likely four discrete focal areas of cord signal abnormality are present.  In light of the MRI brain findings, these are most concerning for a demyelinating process. Transverse myelitis or a metastatic process is considered less likely. 2.  Mild cervical spondylosis as described. The most significant level is C6-7 with mild moderate central and bilateral foraminal stenosis.   Original Report Authenticated By: Marin Roberts, M.D.     Medications:  I have reviewed the patient's current medications. Scheduled: . aspirin  325 mg Oral Daily  . enoxaparin (LOVENOX) injection  40 mg Subcutaneous Q24H    Assessment/Plan: After review of films patient likely with MS.  Has had acute worsening.  Does not consent to LP at this time but may benefit from steroid treatment for 3-5 days.  Risks and benefits discussed with patient.  Will start Solumedrol today.    Recommendations: 1.  Solumedrol 1gm IV daily X 3 days.  Will re-evaluate at that time and determine whether further Solumedrol necessary. 2.   Protonix 40mg  daily.      LOS: 1 day   Thana Farr, MD [facility] [phone] 04/16/2012  3:26 PM

## 2012-04-16 NOTE — Progress Notes (Signed)
  Echocardiogram 2D Echocardiogram has been performed.  Michelle Hodges, Michelle Hodges 04/16/2012, 11:27 AM

## 2012-04-16 NOTE — Progress Notes (Signed)
Rehab Admissions Coordinator Note:  Patient was screened by Trish Mage for appropriateness for an Inpatient Acute Rehab Consult.  Noted PT recommending CIR.  At this time, we are recommending Inpatient Rehab consult.  Trish Mage 04/16/2012, 2:19 PM  I can be reached at [phone].
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
## 2012-04-16 NOTE — Progress Notes (Signed)
UR COMPLETED  

## 2012-04-16 NOTE — Progress Notes (Signed)
VASCULAR LAB PRELIMINARY  PRELIMINARY  PRELIMINARY  PRELIMINARY  Carotid duplex completed.    Preliminary report:  Bilateral:  No evidence of hemodynamically significant internal carotid artery stenosis.   Vertebral artery flow is antegrade.     SLAUGHTER, [place], RVS 04/16/2012, 11:14 AM

## 2012-04-16 NOTE — Progress Notes (Signed)
[facility]             Progress Note   Subjective: No acute complaints. On questioning has had RLE>RUE weakness for about 2 years and has never sought medical attention for this. Has difficulty grasping items intermittently.  Objective: Vital signs in last 24 hours: Temp:  [98 F (36.7 C)-98.3 F (36.8 C)] 98.3 F (36.8 C) (03/14 0631) Pulse Rate:  [54-65] 54 (03/14 0740) Resp:  [16] 16 (03/14 0740) BP: (118-132)/(67-81) 127/67 mmHg (03/14 0740) SpO2:  [99 %-100 %] 100 % (03/14 0740) Weight change:  Last BM Date: 04/15/12  Intake/Output from previous day: 03/13 0701 - 03/14 0700 In: 240 [P.O.:240] Out: -      Physical Exam: General: Alert, awake, oriented x3, in no acute distress. HEENT: No bruits, no goiter. Heart: Regular rate and rhythm, without murmurs, rubs, gallops. Lungs: Clear to auscultation bilaterally. Abdomen: Soft, nontender, nondistended, positive bowel sounds. Extremities: No clubbing cyanosis or edema with positive pedal pulses.    Lab Results: Basic Metabolic Panel:  Recent Labs  16/10/96 1345  NA 142  K 4.4  CL 108  CO2 26  GLUCOSE 83  BUN 16  CREATININE 0.80  CALCIUM 9.3   Liver Function Tests:  Recent Labs  04/15/12 1345  AST 27  ALT 25  ALKPHOS 46  BILITOT 0.4  PROT 6.2  ALBUMIN 3.4*   No results found for this basename: LIPASE, AMYLASE,  in the last 72 hours No results found for this basename: AMMONIA,  in the last 72 hours CBC:  Recent Labs  04/15/12 1345  WBC 8.0  NEUTROABS 4.7  HGB 10.9*  HCT 32.9*  MCV 92.7  PLT 181   Cardiac Enzymes:  Recent Labs  04/15/12 1346  TROPONINI <0.30   CBG:  Recent Labs  04/15/12 1316  GLUCAP 78   Hemoglobin A1C:  Recent Labs  04/16/12 0529  HGBA1C 5.6   Fasting Lipid Panel:  Recent Labs  04/16/12 0529  CHOL 154  HDL 56  LDLCALC 84  TRIG 71  CHOLHDL 2.8   Thyroid Function Tests:  Recent Labs  04/15/12 1721  TSH 3.985   Anemia  Panel:  Recent Labs  04/15/12 1721  VITAMINB12 493   Coagulation:  Recent Labs  04/15/12 1345  LABPROT 12.7  INR 0.96   Urine Drug Screen: Drugs of Abuse     Component Value Date/Time   LABOPIA NONE DETECTED 04/16/2012 0540   COCAINSCRNUR NONE DETECTED 04/16/2012 0540   LABBENZ NONE DETECTED 04/16/2012 0540   AMPHETMU NONE DETECTED 04/16/2012 0540   THCU POSITIVE* 04/16/2012 0540   LABBARB NONE DETECTED 04/16/2012 0540    Urinalysis:  Recent Labs  04/15/12 1428  COLORURINE YELLOW  LABSPEC 1.010  PHURINE 6.0  GLUCOSEU NEGATIVE  HGBUR NEGATIVE  BILIRUBINUR NEGATIVE  KETONESUR NEGATIVE  PROTEINUR NEGATIVE  UROBILINOGEN 0.2  NITRITE NEGATIVE  LEUKOCYTESUR TRACE*    Studies/Results: Ct Head Wo Contrast  04/15/2012  *RADIOLOGY REPORT*  Clinical Data: Fall with head trauma.  Acting strange.  CT HEAD WITHOUT CONTRAST  Technique:  Contiguous axial images were obtained from the base of the skull through the vertex without contrast.  Comparison: None.  Findings: There are patchy and confluent areas of low attenuation in the periventricular deep white matter.  No evidence of acute hemorrhage, mass lesion, mass effect or hydrocephalus.  Scattered mucosal thickening or opacification of the paranasal sinuses. Mastoid air cells are clear.  IMPRESSION: Patchy and confluent areas of low attenuation  in the periventricular deep white matter.  Findings are likely due to chronic microvascular white matter ischemic changes. Focal area of acute or subacute infarction cannot be definitively excluded.   Original Report Authenticated By: Leanna Battles, M.D.    Mr [facility] Wo Contrast  04/15/2012  *RADIOLOGY REPORT*  Clinical Data:  Right-sided weakness.  Episode of disequilibrium and facial droop at work today.  MRI HEAD WITHOUT CONTRAST MRA HEAD WITHOUT CONTRAST  Technique:  Multiplanar, multiecho pulse sequences of the brain and surrounding structures were obtained without intravenous contrast.  Angiographic images of the head were obtained using MRA technique without contrast.  Comparison:  CT head without contrast 04/15/2012.  MRI HEAD  Findings:  The diffusion weighted images demonstrate no areas of focal restricted diffusion to suggest acute or subacute infarction. Signal in the posterior right frontal lobe is compatible with remote infarct and T2 shine through.  Extensive periventricular subcortical white matter changes are evident bilaterally. Remote lacunar infarcts are present in the cerebellum.  A remote subcortical infarct is present in the posterior left frontal or parietal lobe.  Flow is present in the major intracranial arteries.  The globes and orbits are intact.  A fluid level is present in the left maxillary sinus.  Mild circumferential mucosal thickening is present in the maxillary sinuses bilaterally.  There is scattered mucosal disease throughout the ethmoid air cells.  A large polyp or mucous retention cyst is present in the left maxillary sinus.  The mastoid air cells are clear.  IMPRESSION:  1.  No evidence for acute or subacute infarction. 2.  Diffuse white matter disease compatible with chronic microvascular ischemia. 3.  Remote bilateral cortical and subcortical infarcts. 4.  Moderate sinus disease with evidence for acute sinusitis in the left maxillary sinus.  MRA HEAD  Findings: The internal carotid arteries are within normal limits from the high cervical segments through the ICA termini bilaterally.  The A1 and M1 segments are normal.  The anterior communicating artery is patent.  MCA bifurcations are normal bilaterally.  Mild small vessel disease is less than expected for the extent of white matter change.  The vertebral arteries are codominant.  The left PICA origin is visualized and within normal limits.  The basilar artery is normal. Both posterior cerebral arteries originate from the basilar tip. There is mild attenuation of PCA branch vessels.  IMPRESSION:  1.  Mild distal  small vessel disease. 2.  No significant proximal stenosis, aneurysm, or branch vessel occlusion.   Original Report Authenticated By: Marin Roberts, M.D.    Mr Brain Wo Contrast  04/15/2012  *RADIOLOGY REPORT*  Clinical Data:  Right-sided weakness.  Episode of disequilibrium and facial droop at work today.  MRI HEAD WITHOUT CONTRAST MRA HEAD WITHOUT CONTRAST  Technique:  Multiplanar, multiecho pulse sequences of the brain and surrounding structures were obtained without intravenous contrast. Angiographic images of the head were obtained using MRA technique without contrast.  Comparison:  CT head without contrast 04/15/2012.  MRI HEAD  Findings:  The diffusion weighted images demonstrate no areas of focal restricted diffusion to suggest acute or subacute infarction. Signal in the posterior right frontal lobe is compatible with remote infarct and T2 shine through.  Extensive periventricular subcortical white matter changes are evident bilaterally. Remote lacunar infarcts are present in the cerebellum.  A remote subcortical infarct is present in the posterior left frontal or parietal lobe.  Flow is present in the major intracranial arteries.  The globes and orbits are intact.  A fluid level is present in the left maxillary sinus.  Mild circumferential mucosal thickening is present in the maxillary sinuses bilaterally.  There is scattered mucosal disease throughout the ethmoid air cells.  A large polyp or mucous retention cyst is present in the left maxillary sinus.  The mastoid air cells are clear.  IMPRESSION:  1.  No evidence for acute or subacute infarction. 2.  Diffuse white matter disease compatible with chronic microvascular ischemia. 3.  Remote bilateral cortical and subcortical infarcts. 4.  Moderate sinus disease with evidence for acute sinusitis in the left maxillary sinus.  MRA HEAD  Findings: The internal carotid arteries are within normal limits from the high cervical segments through the ICA  termini bilaterally.  The A1 and M1 segments are normal.  The anterior communicating artery is patent.  MCA bifurcations are normal bilaterally.  Mild small vessel disease is less than expected for the extent of white matter change.  The vertebral arteries are codominant.  The left PICA origin is visualized and within normal limits.  The basilar artery is normal. Both posterior cerebral arteries originate from the basilar tip. There is mild attenuation of PCA branch vessels.  IMPRESSION:  1.  Mild distal small vessel disease. 2.  No significant proximal stenosis, aneurysm, or branch vessel occlusion.   Original Report Authenticated By: Marin Roberts, M.D.    Mr Cervical Spine Wo Contrast  04/16/2012  *RADIOLOGY REPORT*  Clinical Data: Right-sided upper and lower extremity weakness. Episode of disequilibrium at work today.  MRI CERVICAL SPINE WITHOUT CONTRAST  Technique:  Multiplanar and multiecho pulse sequences of the cervical spine, to include the craniocervical junction and cervicothoracic junction, were obtained according to standard protocol without intravenous contrast.  Comparison: MRI brain from the same day.  Findings: Multiple areas of cord signal abnormality are evident.  A 10 mm far right lateral segment is noted at the level of C3.  More posteriorly in the right dorsal column is an area of T2 hyperintensity at C4, measuring 11 mm cephalocaudad.  A short segment is seen along the far left lateral aspect of the cord at C4- 5.  Marrow signal is somewhat heterogeneous without a discrete lesion. There is straightening of the normal cervical lordosis.  The craniocervical junction is within normal limits.  Sinus disease is again noted.  C2-3:  Negative.  C3-4:  Mild uncovertebral disease is present.  A disc osteophyte complex contributes to mild central and left foraminal narrowing.  C4-5:  A focal central disc protrusion contacts the ventral surface the cord.  The foramina are patent bilaterally.   C5-6:  Moderate uncovertebral disease is evident bilaterally.  This results in moderate left and mild right foraminal stenosis.  C6-7:  A broad-based disc osteophyte complex is evident. Uncovertebral disease is noted bilaterally.  Mild to moderate central and bilateral foraminal stenosis is evident.  C7-T1:  Left-sided uncovertebral disease results in mild left foraminal stenosis.  The central canal is patent.  IMPRESSION:  1.  At least three and likely four discrete focal areas of cord signal abnormality are present.  In light of the MRI brain findings, these are most concerning for a demyelinating process. Transverse myelitis or a metastatic process is considered less likely. 2.  Mild cervical spondylosis as described. The most significant level is C6-7 with mild moderate central and bilateral foraminal stenosis.   Original Report Authenticated By: Marin Roberts, M.D.     Medications: Scheduled Meds: . aspirin  325 mg Oral Daily  .  enoxaparin (LOVENOX) injection  40 mg Subcutaneous Q24H   Continuous Infusions: . sodium chloride 75 mL/hr at 04/16/12 0044   PRN Meds:.  Assessment/Plan:  Active Problems:   Demyelinating disease of central nervous system   History of CVA (cerebrovascular accident)   Weakness of right side of body   Right-sided weakness -MRI without CVA. -It appears she has a demyelinating CNS disorder ?MS. -Neuro following. Await their recommendations. -PT is recommending CIR. -Will request CIR consult.  Time spent coordinating care: 35 minutes.   LOS: 1 day   [facility] Pager: [phone] 04/16/2012, 3:27 PM

## 2012-04-17 ENCOUNTER — Inpatient Hospital Stay (HOSPITAL_COMMUNITY): Payer: Medicare Other

## 2012-04-17 DIAGNOSIS — R634 Abnormal weight loss: Secondary | ICD-10-CM

## 2012-04-17 DIAGNOSIS — R140 Abdominal distension (gaseous): Secondary | ICD-10-CM

## 2012-04-17 LAB — GLUCOSE, CAPILLARY
Glucose-Capillary: 162 mg/dL — ABNORMAL HIGH (ref 70–99)
Glucose-Capillary: 156 mg/dL — ABNORMAL HIGH (ref 70–99)
Glucose-Capillary: 159 mg/dL — ABNORMAL HIGH (ref 70–99)
Glucose-Capillary: 158 mg/dL — ABNORMAL HIGH (ref 70–99)

## 2012-04-17 MED ORDER — IOHEXOL 300 MG/ML  SOLN
80.0000 mL | Freq: Once | INTRAMUSCULAR | Status: AC | PRN
  Administered 2012-04-17: 80 mL via INTRAVENOUS

## 2012-04-17 MED ORDER — IOHEXOL 300 MG/ML  SOLN
25.0000 mL | INTRAMUSCULAR | Status: AC
  Administered 2012-04-17 (×2): 25 mL via ORAL

## 2012-04-17 NOTE — Progress Notes (Signed)
Pt. Made aware that she is not to eat anything from now until after abdominal CT scan.

## 2012-04-17 NOTE — Progress Notes (Signed)
Pt. Up in chair, tolerating well, family members at bedside.  No complaints at this time.  States she is still willing to have LP done.
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
## 2012-04-17 NOTE — Progress Notes (Signed)
Subjective: Patient reports that she is feeling stronger today.  Is s/p 1 dose of Solumedrol.  Does not report any side effects.  Continues to decline LP.  Objective: Current vital signs: BP 120/77  Pulse 73  Temp(Src) 98.3 F (36.8 C) (Oral)  Resp 16  Ht 5\' 8"  (1.727 m)  Wt 56.7 kg (125 lb)  BMI 19.01 kg/m2  SpO2 95% Vital signs in last 24 hours: Temp:  [97.8 F (36.6 C)-98.6 F (37 C)] 98.3 F (36.8 C) (03/15 0600) Pulse Rate:  [60-75] 73 (03/15 0600) Resp:  [16-18] 16 (03/15 0600) BP: (119-124)/(67-83) 120/77 mmHg (03/15 0600) SpO2:  [94 %-100 %] 95 % (03/15 0600)  Intake/Output from previous day:   Intake/Output this shift:   Nutritional status: Cardiac  Neurologic Exam: Mental Status:  Alert, oriented, thought content appropriate. Speech fluent without evidence of aphasia. Able to follow 3 step commands without difficulty.  Cranial Nerves:  II: visual fields grossly normal, pupils equal, round, reactive to light and accommodation  III,IV, VI: ptosis not present, extra-ocular motions intact bilaterally  V,VII: smile symmetric, facial light touch sensation normal bilaterally  VIII: hearing normal bilaterally  IX,X: gag reflex present  XI: trapezius strength/neck flexion strength normal bilaterally  XII: tongue strength normal  Motor:  Right : Upper extremity 5-/5         Left: Upper extremity 5/5   Lower extremity 5-/5       Lower extremity 5/5  Tone and bulk:normal tone throughout; no atrophy noted  Sensory: Pinprick and light touch intact throughout, bilaterally  Deep Tendon Reflexes: 2+ throughout with absent AJ's bilaterally  Plantars:  Right: upoing   Left: upgoing  Cerebellar:  normal finger-to-nose; dysmetric heel to shin on the right Gait: deliberate with episodes of loss of balance   Lab Results: Basic Metabolic Panel:  Recent Labs Lab 04/15/12 1345  NA 142  K 4.4  CL 108  CO2 26  GLUCOSE 83  BUN 16  CREATININE 0.80  CALCIUM 9.3     Liver Function Tests:  Recent Labs Lab 04/15/12 1345  AST 27  ALT 25  ALKPHOS 46  BILITOT 0.4  PROT 6.2  ALBUMIN 3.4*   No results found for this basename: LIPASE, AMYLASE,  in the last 168 hours No results found for this basename: AMMONIA,  in the last 168 hours  CBC:  Recent Labs Lab 04/15/12 1345  WBC 8.0  NEUTROABS 4.7  HGB 10.9*  HCT 32.9*  MCV 92.7  PLT 181    Cardiac Enzymes:  Recent Labs Lab 04/15/12 1346  TROPONINI <0.30    Lipid Panel:  Recent Labs Lab 04/16/12 0529  CHOL 154  TRIG 71  HDL 56  CHOLHDL 2.8  VLDL 14  LDLCALC 84    CBG:  Recent Labs Lab 04/15/12 1316 04/16/12 2204  GLUCAP 78 172*    Microbiology: No results found for this or any previous visit.  Coagulation Studies:  Recent Labs  04/15/12 1345  LABPROT 12.7  INR 0.96    Imaging: Ct Head Wo Contrast  04/15/2012  *RADIOLOGY REPORT*  Clinical Data: Fall with head trauma.  Acting strange.  CT HEAD WITHOUT CONTRAST  Technique:  Contiguous axial images were obtained from the base of the skull through the vertex without contrast.  Comparison: None.  Findings: There are patchy and confluent areas of low attenuation in the periventricular deep white matter.  No evidence of acute hemorrhage, mass lesion, mass effect or hydrocephalus.  Scattered mucosal thickening  or opacification of the paranasal sinuses. Mastoid air cells are clear.  IMPRESSION: Patchy and confluent areas of low attenuation in the periventricular deep white matter.  Findings are likely due to chronic microvascular white matter ischemic changes. Focal area of acute or subacute infarction cannot be definitively excluded.   Original Report Authenticated By: Leanna Battles, M.D.    Mr [facility] Wo Contrast  04/15/2012  *RADIOLOGY REPORT*  Clinical Data:  Right-sided weakness.  Episode of disequilibrium and facial droop at work today.  MRI HEAD WITHOUT CONTRAST MRA HEAD WITHOUT CONTRAST  Technique:   Multiplanar, multiecho pulse sequences of the brain and surrounding structures were obtained without intravenous contrast. Angiographic images of the head were obtained using MRA technique without contrast.  Comparison:  CT head without contrast 04/15/2012.  MRI HEAD  Findings:  The diffusion weighted images demonstrate no areas of focal restricted diffusion to suggest acute or subacute infarction. Signal in the posterior right frontal lobe is compatible with remote infarct and T2 shine through.  Extensive periventricular subcortical white matter changes are evident bilaterally. Remote lacunar infarcts are present in the cerebellum.  A remote subcortical infarct is present in the posterior left frontal or parietal lobe.  Flow is present in the major intracranial arteries.  The globes and orbits are intact.  A fluid level is present in the left maxillary sinus.  Mild circumferential mucosal thickening is present in the maxillary sinuses bilaterally.  There is scattered mucosal disease throughout the ethmoid air cells.  A large polyp or mucous retention cyst is present in the left maxillary sinus.  The mastoid air cells are clear.  IMPRESSION:  1.  No evidence for acute or subacute infarction. 2.  Diffuse white matter disease compatible with chronic microvascular ischemia. 3.  Remote bilateral cortical and subcortical infarcts. 4.  Moderate sinus disease with evidence for acute sinusitis in the left maxillary sinus.  MRA HEAD  Findings: The internal carotid arteries are within normal limits from the high cervical segments through the ICA termini bilaterally.  The A1 and M1 segments are normal.  The anterior communicating artery is patent.  MCA bifurcations are normal bilaterally.  Mild small vessel disease is less than expected for the extent of white matter change.  The vertebral arteries are codominant.  The left PICA origin is visualized and within normal limits.  The basilar artery is normal. Both posterior  cerebral arteries originate from the basilar tip. There is mild attenuation of PCA branch vessels.  IMPRESSION:  1.  Mild distal small vessel disease. 2.  No significant proximal stenosis, aneurysm, or branch vessel occlusion.   Original Report Authenticated By: Marin Roberts, M.D.    Mr Brain Wo Contrast  04/15/2012  *RADIOLOGY REPORT*  Clinical Data:  Right-sided weakness.  Episode of disequilibrium and facial droop at work today.  MRI HEAD WITHOUT CONTRAST MRA HEAD WITHOUT CONTRAST  Technique:  Multiplanar, multiecho pulse sequences of the brain and surrounding structures were obtained without intravenous contrast. Angiographic images of the head were obtained using MRA technique without contrast.  Comparison:  CT head without contrast 04/15/2012.  MRI HEAD  Findings:  The diffusion weighted images demonstrate no areas of focal restricted diffusion to suggest acute or subacute infarction. Signal in the posterior right frontal lobe is compatible with remote infarct and T2 shine through.  Extensive periventricular subcortical white matter changes are evident bilaterally. Remote lacunar infarcts are present in the cerebellum.  A remote subcortical infarct is present in the posterior left frontal  or parietal lobe.  Flow is present in the major intracranial arteries.  The globes and orbits are intact.  A fluid level is present in the left maxillary sinus.  Mild circumferential mucosal thickening is present in the maxillary sinuses bilaterally.  There is scattered mucosal disease throughout the ethmoid air cells.  A large polyp or mucous retention cyst is present in the left maxillary sinus.  The mastoid air cells are clear.  IMPRESSION:  1.  No evidence for acute or subacute infarction. 2.  Diffuse white matter disease compatible with chronic microvascular ischemia. 3.  Remote bilateral cortical and subcortical infarcts. 4.  Moderate sinus disease with evidence for acute sinusitis in the left maxillary sinus.   MRA HEAD  Findings: The internal carotid arteries are within normal limits from the high cervical segments through the ICA termini bilaterally.  The A1 and M1 segments are normal.  The anterior communicating artery is patent.  MCA bifurcations are normal bilaterally.  Mild small vessel disease is less than expected for the extent of white matter change.  The vertebral arteries are codominant.  The left PICA origin is visualized and within normal limits.  The basilar artery is normal. Both posterior cerebral arteries originate from the basilar tip. There is mild attenuation of PCA branch vessels.  IMPRESSION:  1.  Mild distal small vessel disease. 2.  No significant proximal stenosis, aneurysm, or branch vessel occlusion.   Original Report Authenticated By: Marin Roberts, M.D.    Mr Cervical Spine Wo Contrast  04/16/2012  *RADIOLOGY REPORT*  Clinical Data: Right-sided upper and lower extremity weakness. Episode of disequilibrium at work today.  MRI CERVICAL SPINE WITHOUT CONTRAST  Technique:  Multiplanar and multiecho pulse sequences of the cervical spine, to include the craniocervical junction and cervicothoracic junction, were obtained according to standard protocol without intravenous contrast.  Comparison: MRI brain from the same day.  Findings: Multiple areas of cord signal abnormality are evident.  A 10 mm far right lateral segment is noted at the level of C3.  More posteriorly in the right dorsal column is an area of T2 hyperintensity at C4, measuring 11 mm cephalocaudad.  A short segment is seen along the far left lateral aspect of the cord at C4- 5.  Marrow signal is somewhat heterogeneous without a discrete lesion. There is straightening of the normal cervical lordosis.  The craniocervical junction is within normal limits.  Sinus disease is again noted.  C2-3:  Negative.  C3-4:  Mild uncovertebral disease is present.  A disc osteophyte complex contributes to mild central and left foraminal  narrowing.  C4-5:  A focal central disc protrusion contacts the ventral surface the cord.  The foramina are patent bilaterally.  C5-6:  Moderate uncovertebral disease is evident bilaterally.  This results in moderate left and mild right foraminal stenosis.  C6-7:  A broad-based disc osteophyte complex is evident. Uncovertebral disease is noted bilaterally.  Mild to moderate central and bilateral foraminal stenosis is evident.  C7-T1:  Left-sided uncovertebral disease results in mild left foraminal stenosis.  The central canal is patent.  IMPRESSION:  1.  At least three and likely four discrete focal areas of cord signal abnormality are present.  In light of the MRI brain findings, these are most concerning for a demyelinating process. Transverse myelitis or a metastatic process is considered less likely. 2.  Mild cervical spondylosis as described. The most significant level is C6-7 with mild moderate central and bilateral foraminal stenosis.   Original Report Authenticated  By: Marin Roberts, M.D.     Medications:  I have reviewed the patient's current medications. Scheduled: . aspirin  325 mg Oral Daily  . enoxaparin (LOVENOX) injection  40 mg Subcutaneous Q24H  . methylPREDNISolone (SOLU-MEDROL) injection  1,000 mg Intravenous Q24H  . pantoprazole (PROTONIX) IV  40 mg Intravenous Q24H    Assessment/Plan: MS   Assessment:  Today will be day#2 of Solumedrol.  Patient reports some improvement but continues to have gait issues.     Plan: 1.  PT to continue while hospitalized and on an outpatient basis.              2.  Three days of Solumedrol anticipated.     LOS: 2 days   Thana Farr, MD [facility] [phone] 04/17/2012  10:27 AM

## 2012-04-17 NOTE — Progress Notes (Signed)
[facility]             Progress Note   Subjective: Says she feels a little stronger today. Has significant abdominal distention and what appears to be hepatomegaly on exam. Per daughters, this has been present for 1 year coupled with weight loss.  Objective: Vital signs in last 24 hours: Temp:  [97.7 F (36.5 C)-98.6 F (37 C)] 97.7 F (36.5 C) (03/15 1127) Pulse Rate:  [60-75] 60 (03/15 1127) Resp:  [16-18] 18 (03/15 1127) BP: (119-129)/(67-88) 129/88 mmHg (03/15 1127) SpO2:  [94 %-100 %] 99 % (03/15 1127) Weight change:  Last BM Date: 04/15/12  Intake/Output from previous day:       Physical Exam: General: Alert, awake, oriented x3, in no acute distress. HEENT: No bruits, no goiter. Heart: Regular rate and rhythm, without murmurs, rubs, gallops. Lungs: Clear to auscultation bilaterally. Abdomen: distended, hepatomegaly, +BS. Extremities: No clubbing cyanosis or edema with positive pedal pulses.    Lab Results: Basic Metabolic Panel:  Recent Labs  16/10/96 1345  NA 142  K 4.4  CL 108  CO2 26  GLUCOSE 83  BUN 16  CREATININE 0.80  CALCIUM 9.3   Liver Function Tests:  Recent Labs  04/15/12 1345  AST 27  ALT 25  ALKPHOS 46  BILITOT 0.4  PROT 6.2  ALBUMIN 3.4*   CBC:  Recent Labs  04/15/12 1345  WBC 8.0  NEUTROABS 4.7  HGB 10.9*  HCT 32.9*  MCV 92.7  PLT 181   Cardiac Enzymes:  Recent Labs  04/15/12 1346  TROPONINI <0.30   CBG:  Recent Labs  04/15/12 1316 04/16/12 2204  GLUCAP 78 172*   Hemoglobin A1C:  Recent Labs  04/16/12 0529  HGBA1C 5.6   Fasting Lipid Panel:  Recent Labs  04/16/12 0529  CHOL 154  HDL 56  LDLCALC 84  TRIG 71  CHOLHDL 2.8   Thyroid Function Tests:  Recent Labs  04/15/12 1721  TSH 3.985   Anemia Panel:  Recent Labs  04/15/12 1721  VITAMINB12 493   Coagulation:  Recent Labs  04/15/12 1345  LABPROT 12.7  INR 0.96   Urine Drug Screen: Drugs of Abuse      Component Value Date/Time   LABOPIA NONE DETECTED 04/16/2012 0540   COCAINSCRNUR NONE DETECTED 04/16/2012 0540   LABBENZ NONE DETECTED 04/16/2012 0540   AMPHETMU NONE DETECTED 04/16/2012 0540   THCU POSITIVE* 04/16/2012 0540   LABBARB NONE DETECTED 04/16/2012 0540    Urinalysis:  Recent Labs  04/15/12 1428  COLORURINE YELLOW  LABSPEC 1.010  PHURINE 6.0  GLUCOSEU NEGATIVE  HGBUR NEGATIVE  BILIRUBINUR NEGATIVE  KETONESUR NEGATIVE  PROTEINUR NEGATIVE  UROBILINOGEN 0.2  NITRITE NEGATIVE  LEUKOCYTESUR TRACE*    Studies/Results: Ct Head Wo Contrast  04/15/2012  *RADIOLOGY REPORT*  Clinical Data: Fall with head trauma.  Acting strange.  CT HEAD WITHOUT CONTRAST  Technique:  Contiguous axial images were obtained from the base of the skull through the vertex without contrast.  Comparison: None.  Findings: There are patchy and confluent areas of low attenuation in the periventricular deep white matter.  No evidence of acute hemorrhage, mass lesion, mass effect or hydrocephalus.  Scattered mucosal thickening or opacification of the paranasal sinuses. Mastoid air cells are clear.  IMPRESSION: Patchy and confluent areas of low attenuation in the periventricular deep white matter.  Findings are likely due to chronic microvascular white matter ischemic changes. Focal area of acute or subacute infarction cannot be definitively excluded.  Original Report Authenticated By: Leanna Battles, M.D.    Mr [facility] Wo Contrast  04/15/2012  *RADIOLOGY REPORT*  Clinical Data:  Right-sided weakness.  Episode of disequilibrium and facial droop at work today.  MRI HEAD WITHOUT CONTRAST MRA HEAD WITHOUT CONTRAST  Technique:  Multiplanar, multiecho pulse sequences of the brain and surrounding structures were obtained without intravenous contrast. Angiographic images of the head were obtained using MRA technique without contrast.  Comparison:  CT head without contrast 04/15/2012.  MRI HEAD  Findings:  The diffusion  weighted images demonstrate no areas of focal restricted diffusion to suggest acute or subacute infarction. Signal in the posterior right frontal lobe is compatible with remote infarct and T2 shine through.  Extensive periventricular subcortical white matter changes are evident bilaterally. Remote lacunar infarcts are present in the cerebellum.  A remote subcortical infarct is present in the posterior left frontal or parietal lobe.  Flow is present in the major intracranial arteries.  The globes and orbits are intact.  A fluid level is present in the left maxillary sinus.  Mild circumferential mucosal thickening is present in the maxillary sinuses bilaterally.  There is scattered mucosal disease throughout the ethmoid air cells.  A large polyp or mucous retention cyst is present in the left maxillary sinus.  The mastoid air cells are clear.  IMPRESSION:  1.  No evidence for acute or subacute infarction. 2.  Diffuse white matter disease compatible with chronic microvascular ischemia. 3.  Remote bilateral cortical and subcortical infarcts. 4.  Moderate sinus disease with evidence for acute sinusitis in the left maxillary sinus.  MRA HEAD  Findings: The internal carotid arteries are within normal limits from the high cervical segments through the ICA termini bilaterally.  The A1 and M1 segments are normal.  The anterior communicating artery is patent.  MCA bifurcations are normal bilaterally.  Mild small vessel disease is less than expected for the extent of white matter change.  The vertebral arteries are codominant.  The left PICA origin is visualized and within normal limits.  The basilar artery is normal. Both posterior cerebral arteries originate from the basilar tip. There is mild attenuation of PCA branch vessels.  IMPRESSION:  1.  Mild distal small vessel disease. 2.  No significant proximal stenosis, aneurysm, or branch vessel occlusion.   Original Report Authenticated By: Marin Roberts, M.D.    Mr  Brain Wo Contrast  04/15/2012  *RADIOLOGY REPORT*  Clinical Data:  Right-sided weakness.  Episode of disequilibrium and facial droop at work today.  MRI HEAD WITHOUT CONTRAST MRA HEAD WITHOUT CONTRAST  Technique:  Multiplanar, multiecho pulse sequences of the brain and surrounding structures were obtained without intravenous contrast. Angiographic images of the head were obtained using MRA technique without contrast.  Comparison:  CT head without contrast 04/15/2012.  MRI HEAD  Findings:  The diffusion weighted images demonstrate no areas of focal restricted diffusion to suggest acute or subacute infarction. Signal in the posterior right frontal lobe is compatible with remote infarct and T2 shine through.  Extensive periventricular subcortical white matter changes are evident bilaterally. Remote lacunar infarcts are present in the cerebellum.  A remote subcortical infarct is present in the posterior left frontal or parietal lobe.  Flow is present in the major intracranial arteries.  The globes and orbits are intact.  A fluid level is present in the left maxillary sinus.  Mild circumferential mucosal thickening is present in the maxillary sinuses bilaterally.  There is scattered mucosal disease throughout the ethmoid  air cells.  A large polyp or mucous retention cyst is present in the left maxillary sinus.  The mastoid air cells are clear.  IMPRESSION:  1.  No evidence for acute or subacute infarction. 2.  Diffuse white matter disease compatible with chronic microvascular ischemia. 3.  Remote bilateral cortical and subcortical infarcts. 4.  Moderate sinus disease with evidence for acute sinusitis in the left maxillary sinus.  MRA HEAD  Findings: The internal carotid arteries are within normal limits from the high cervical segments through the ICA termini bilaterally.  The A1 and M1 segments are normal.  The anterior communicating artery is patent.  MCA bifurcations are normal bilaterally.  Mild small vessel disease  is less than expected for the extent of white matter change.  The vertebral arteries are codominant.  The left PICA origin is visualized and within normal limits.  The basilar artery is normal. Both posterior cerebral arteries originate from the basilar tip. There is mild attenuation of PCA branch vessels.  IMPRESSION:  1.  Mild distal small vessel disease. 2.  No significant proximal stenosis, aneurysm, or branch vessel occlusion.   Original Report Authenticated By: Marin Roberts, M.D.    Mr Cervical Spine Wo Contrast  04/16/2012  *RADIOLOGY REPORT*  Clinical Data: Right-sided upper and lower extremity weakness. Episode of disequilibrium at work today.  MRI CERVICAL SPINE WITHOUT CONTRAST  Technique:  Multiplanar and multiecho pulse sequences of the cervical spine, to include the craniocervical junction and cervicothoracic junction, were obtained according to standard protocol without intravenous contrast.  Comparison: MRI brain from the same day.  Findings: Multiple areas of cord signal abnormality are evident.  A 10 mm far right lateral segment is noted at the level of C3.  More posteriorly in the right dorsal column is an area of T2 hyperintensity at C4, measuring 11 mm cephalocaudad.  A short segment is seen along the far left lateral aspect of the cord at C4- 5.  Marrow signal is somewhat heterogeneous without a discrete lesion. There is straightening of the normal cervical lordosis.  The craniocervical junction is within normal limits.  Sinus disease is again noted.  C2-3:  Negative.  C3-4:  Mild uncovertebral disease is present.  A disc osteophyte complex contributes to mild central and left foraminal narrowing.  C4-5:  A focal central disc protrusion contacts the ventral surface the cord.  The foramina are patent bilaterally.  C5-6:  Moderate uncovertebral disease is evident bilaterally.  This results in moderate left and mild right foraminal stenosis.  C6-7:  A broad-based disc osteophyte complex  is evident. Uncovertebral disease is noted bilaterally.  Mild to moderate central and bilateral foraminal stenosis is evident.  C7-T1:  Left-sided uncovertebral disease results in mild left foraminal stenosis.  The central canal is patent.  IMPRESSION:  1.  At least three and likely four discrete focal areas of cord signal abnormality are present.  In light of the MRI brain findings, these are most concerning for a demyelinating process. Transverse myelitis or a metastatic process is considered less likely. 2.  Mild cervical spondylosis as described. The most significant level is C6-7 with mild moderate central and bilateral foraminal stenosis.   Original Report Authenticated By: Marin Roberts, M.D.     Medications: Scheduled Meds: . aspirin  325 mg Oral Daily  . enoxaparin (LOVENOX) injection  40 mg Subcutaneous Q24H  . methylPREDNISolone (SOLU-MEDROL) injection  1,000 mg Intravenous Q24H  . pantoprazole (PROTONIX) IV  40 mg Intravenous Q24H   Continuous  Infusions: . sodium chloride 75 mL/hr at 04/17/12 1027   PRN Meds:.  Assessment/Plan:  Active Problems:   Demyelinating disease of central nervous system   History of CVA (cerebrovascular accident)   Weakness of right side of body   Multiple sclerosis   Right-sided weakness -MRI without CVA. -It appears she has a demyelinating CNS disorder ?MS. -Neuro following and has recommended 3 days of 1000 mg solumedrol. -She has received her first today and feels stronger. -Refuses LP that neuro has recommended. -PT is recommending CIR. -CIR consult requested 3/14.  Abdominal Distention/Weight Loss -She has unintentionally lost 30 lbs in 1 year. -She has significant distention on exam with what appears to be hepatomegaly. -My major concern would be a GI malignancy. -Will check a CT abd/pelvis.  Time spent coordinating care: 35 minutes.   LOS: 2 days   [facility] Pager: [phone] 04/17/2012,  11:52 AM

## 2012-04-18 LAB — GLUCOSE, CAPILLARY
Glucose-Capillary: 139 mg/dL — ABNORMAL HIGH (ref 70–99)
Glucose-Capillary: 109 mg/dL — ABNORMAL HIGH (ref 70–99)
Glucose-Capillary: 109 mg/dL — ABNORMAL HIGH (ref 70–99)

## 2012-04-18 MED ORDER — DOCUSATE SODIUM 100 MG PO CAPS
100.0000 mg | ORAL_CAPSULE | Freq: Two times a day (BID) | ORAL | Status: DC
  Administered 2012-04-18 – 2012-04-21 (×5): 100 mg via ORAL
  Filled 2012-04-18 (×5): qty 1

## 2012-04-18 MED ORDER — POLYETHYLENE GLYCOL 3350 17 G PO PACK
17.0000 g | PACK | Freq: Every day | ORAL | Status: DC
  Administered 2012-04-18 – 2012-04-19 (×2): 17 g via ORAL
  Filled 2012-04-18 (×4): qty 1

## 2012-04-18 NOTE — Progress Notes (Signed)
[facility]             Progress Note   Subjective: Has decided to proceed with LP. As such, her solumedrol has been held by neurology as to not interfere with results. Continues to have small amounts of diarrhea.  Objective: Vital signs in last 24 hours: Temp:  [97.7 F (36.5 C)-97.8 F (36.6 C)] 97.7 F (36.5 C) (03/16 0700) Pulse Rate:  [60-98] 71 (03/16 0700) Resp:  [18] 18 (03/16 0700) BP: (129-138)/(73-88) 138/79 mmHg (03/16 0700) SpO2:  [97 %-99 %] 98 % (03/16 0700) Weight change:  Last BM Date: 04/17/12  Intake/Output from previous day:   Total I/O In: 360 [P.O.:360] Out: -    Physical Exam: General: Alert, awake, oriented x3. HEENT: No bruits, no goiter. Heart: Regular rate and rhythm, without murmurs, rubs, gallops. Lungs: Clear to auscultation bilaterally. Abdomen: distended, +BS. Extremities: No clubbing cyanosis or edema with positive pedal pulses.    Lab Results: Basic Metabolic Panel:  Recent Labs  57/84/69 1345  NA 142  K 4.4  CL 108  CO2 26  GLUCOSE 83  BUN 16  CREATININE 0.80  CALCIUM 9.3   Liver Function Tests:  Recent Labs  04/15/12 1345  AST 27  ALT 25  ALKPHOS 46  BILITOT 0.4  PROT 6.2  ALBUMIN 3.4*   CBC:  Recent Labs  04/15/12 1345  WBC 8.0  NEUTROABS 4.7  HGB 10.9*  HCT 32.9*  MCV 92.7  PLT 181   Cardiac Enzymes:  Recent Labs  04/15/12 1346  TROPONINI <0.30   CBG:  Recent Labs  04/16/12 2204 04/17/12 0707 04/17/12 1200 04/17/12 1635 04/17/12 2120 04/18/12 0724  GLUCAP 172* 162* 156* 159* 158* 139*   Hemoglobin A1C:  Recent Labs  04/16/12 0529  HGBA1C 5.6   Fasting Lipid Panel:  Recent Labs  04/16/12 0529  CHOL 154  HDL 56  LDLCALC 84  TRIG 71  CHOLHDL 2.8   Thyroid Function Tests:  Recent Labs  04/15/12 1721  TSH 3.985   Anemia Panel:  Recent Labs  04/15/12 1721  VITAMINB12 493   Coagulation:  Recent Labs  04/15/12 1345  LABPROT 12.7  INR 0.96    Urine Drug Screen: Drugs of Abuse     Component Value Date/Time   LABOPIA NONE DETECTED 04/16/2012 0540   COCAINSCRNUR NONE DETECTED 04/16/2012 0540   LABBENZ NONE DETECTED 04/16/2012 0540   AMPHETMU NONE DETECTED 04/16/2012 0540   THCU POSITIVE* 04/16/2012 0540   LABBARB NONE DETECTED 04/16/2012 0540    Urinalysis:  Recent Labs  04/15/12 1428  COLORURINE YELLOW  LABSPEC 1.010  PHURINE 6.0  GLUCOSEU NEGATIVE  HGBUR NEGATIVE  BILIRUBINUR NEGATIVE  KETONESUR NEGATIVE  PROTEINUR NEGATIVE  UROBILINOGEN 0.2  NITRITE NEGATIVE  LEUKOCYTESUR TRACE*    Studies/Results: Ct Abdomen Pelvis W Contrast  04/17/2012  *RADIOLOGY REPORT*  Clinical Data: Abdominal distention.  Weight loss.  CT ABDOMEN AND PELVIS WITH CONTRAST  Technique:  Multidetector CT imaging of the abdomen and pelvis was performed following the standard protocol during bolus administration of intravenous contrast.  Contrast: 80mL OMNIPAQUE IOHEXOL 300 MG/ML  SOLN  Comparison: None.  Findings: The patient has a redundant distended  colon.  There is extensive stool and air in the colon.  The patient has a very elongated sigmoid colon which lies anteriorly in the abdomen.  The distal sigmoid and rectum are not distended.  I do not see a volvulus but there may be a functional obstruction in the  mid sigmoid due to the stool filled and air filled loops of bowel. There is no small bowel distention.  The liver, spleen, pancreas, biliary tree, adrenal glands, and kidneys appear normal.  There is no free air or free fluid.  Uterus appears normal.  The ovaries are not identified.  No significant osseous abnormality.  IMPRESSION: Extensive stool and air in the colon.  However, the distal sigmoid and rectum are empty. The proximal sigmoid is quite redundant and dilated with air. I do not see a definite sigmoid volvulus but there may be functional obstruction of the distal sigmoid due  to compression by the other loops of bowel.  An incomplete  sigmoid volvulus could also give this appearance.   Critical Value/emergent results were called by telephone at the time of interpretation on 04/17/2012 at 6:35 p.m. to the patient's nurse, Carley Hammed, who verbally acknowledged these results.   Original Report Authenticated By: Francene Boyers, M.D.     Medications: Scheduled Meds: . aspirin  325 mg Oral Daily  . docusate sodium  100 mg Oral BID  . enoxaparin (LOVENOX) injection  40 mg Subcutaneous Q24H  . methylPREDNISolone (SOLU-MEDROL) injection  1,000 mg Intravenous Q24H  . pantoprazole (PROTONIX) IV  40 mg Intravenous Q24H  . polyethylene glycol  17 g Oral Daily   Continuous Infusions: . sodium chloride 75 mL/hr at 04/17/12 1027   PRN Meds:.  Assessment/Plan:  Active Problems:   Demyelinating disease of central nervous system   History of CVA (cerebrovascular accident)   Weakness of right side of body   Multiple sclerosis   Abdominal distention   Loss of weight   Right-sided weakness -MRI without CVA. -It appears she has a demyelinating CNS disorder ?MS. -Neuro following and has recommended 3 days of 1000 mg solumedrol. -Patient has now consented to LP, so solumedrol is on hold. -CIR consult requested 3/14.  Abdominal Distention -CT scan: Extensive stool and air in the colon. However, the distal sigmoid  and rectum are empty. The proximal sigmoid is quite redundant and  dilated with air. I do not see a definite sigmoid volvulus but  there may be functional obstruction of the distal sigmoid due to  compression by the other loops of bowel. An incomplete sigmoid  volvulus could also give this appearance. -She has had diarrhea and bloating for at least a year. -Not sure if something like a decompressive colonoscopy needs to be done. I have consulted GI, Dr. Loreta Ave. -In the meantime, will start her on a bowel regimen to include miralax and colace.   Time spent coordinating care: 35 minutes.   LOS: 3 days   [id number] [facility] Pager: [phone] 04/18/2012, 10:42 AM

## 2012-04-18 NOTE — Progress Notes (Signed)
Pt up in room with 1 person assist, seems stronger.  LP on hold per Dr.  Stann Mainland apply SCD's.

## 2012-04-18 NOTE — Progress Notes (Signed)
Subjective: Patient feels that her numbness is improved.  Ambulating improved as well.  Has not gone out of room with PT.  Objective: Current vital signs: BP 145/85  Pulse 63  Temp(Src) 98.1 F (36.7 C) (Oral)  Resp 18  Ht 5\' 8"  (1.727 m)  Wt 56.7 kg (125 lb)  BMI 19.01 kg/m2  SpO2 98% Vital signs in last 24 hours: Temp:  [97.7 F (36.5 C)-98.1 F (36.7 C)] 98.1 F (36.7 C) (03/16 1059) Pulse Rate:  [63-98] 63 (03/16 1059) Resp:  [18] 18 (03/16 1059) BP: (129-145)/(73-85) 145/85 mmHg (03/16 1059) SpO2:  [97 %-98 %] 98 % (03/16 1059)  Intake/Output from previous day:   Intake/Output this shift: Total I/O In: 360 [P.O.:360] Out: -  Nutritional status: Cardiac  Neurologic Exam: Mental Status:  Alert, oriented, thought content appropriate. Speech fluent without evidence of aphasia. Able to follow 3 step commands without difficulty.  Cranial Nerves:  II: visual fields grossly normal, pupils equal, round, reactive to light and accommodation  III,IV, VI: ptosis not present, extra-ocular motions intact bilaterally  V,VII: smile symmetric, facial light touch sensation normal bilaterally  VIII: hearing normal bilaterally  IX,X: gag reflex present  XI: trapezius strength/neck flexion strength normal bilaterally  XII: tongue strength normal  Motor:  Right : Upper extremity 5-/5          Left: Upper extremity 5/5   Lower extremity 5-/5       Lower extremity 5/5  Tone and bulk:normal tone throughout; no atrophy noted  Sensory: Pinprick and light touch intact throughout, bilaterally  Deep Tendon Reflexes: 2+ throughout with absent AJ's bilaterally  Plantars:  Right: upoing     Left: upgoing  Cerebellar:  normal finger-to-nose; dysmetric heel to shin on the right  Gait: deliberate    Lab Results: Basic Metabolic Panel:  Recent Labs Lab 04/15/12 1345  NA 142  K 4.4  CL 108  CO2 26  GLUCOSE 83  BUN 16  CREATININE 0.80  CALCIUM 9.3    Liver Function  Tests:  Recent Labs Lab 04/15/12 1345  AST 27  ALT 25  ALKPHOS 46  BILITOT 0.4  PROT 6.2  ALBUMIN 3.4*   No results found for this basename: LIPASE, AMYLASE,  in the last 168 hours No results found for this basename: AMMONIA,  in the last 168 hours  CBC:  Recent Labs Lab 04/15/12 1345  WBC 8.0  NEUTROABS 4.7  HGB 10.9*  HCT 32.9*  MCV 92.7  PLT 181    Cardiac Enzymes:  Recent Labs Lab 04/15/12 1346  TROPONINI <0.30    Lipid Panel:  Recent Labs Lab 04/16/12 0529  CHOL 154  TRIG 71  HDL 56  CHOLHDL 2.8  VLDL 14  LDLCALC 84    CBG:  Recent Labs Lab 04/17/12 0707 04/17/12 1200 04/17/12 1635 04/17/12 2120 04/18/12 0724  GLUCAP 162* 156* 159* 158* 139*    Microbiology: No results found for this or any previous visit.  Coagulation Studies:  Recent Labs  04/15/12 1345  LABPROT 12.7  INR 0.96    Imaging: Ct Abdomen Pelvis W Contrast  04/17/2012  *RADIOLOGY REPORT*  Clinical Data: Abdominal distention.  Weight loss.  CT ABDOMEN AND PELVIS WITH CONTRAST  Technique:  Multidetector CT imaging of the abdomen and pelvis was performed following the standard protocol during bolus administration of intravenous contrast.  Contrast: 80mL OMNIPAQUE IOHEXOL 300 MG/ML  SOLN  Comparison: None.  Findings: The patient has a redundant distended  colon.  There is extensive stool and air in the colon.  The patient has a very elongated sigmoid colon which lies anteriorly in the abdomen.  The distal sigmoid and rectum are not distended.  I do not see a volvulus but there may be a functional obstruction in the mid sigmoid due to the stool filled and air filled loops of bowel. There is no small bowel distention.  The liver, spleen, pancreas, biliary tree, adrenal glands, and kidneys appear normal.  There is no free air or free fluid.  Uterus appears normal.  The ovaries are not identified.  No significant osseous abnormality.  IMPRESSION: Extensive stool and air in the  colon.  However, the distal sigmoid and rectum are empty. The proximal sigmoid is quite redundant and dilated with air. I do not see a definite sigmoid volvulus but there may be functional obstruction of the distal sigmoid due  to compression by the other loops of bowel.  An incomplete sigmoid volvulus could also give this appearance.   Critical Value/emergent results were called by telephone at the time of interpretation on 04/17/2012 at 6:35 p.m. to the patient's nurse, Carley Hammed, who verbally acknowledged these results.   Original Report Authenticated By: Francene Boyers, M.D.     Medications:  I have reviewed the patient's current medications. Scheduled: . aspirin  325 mg Oral Daily  . docusate sodium  100 mg Oral BID  . methylPREDNISolone (SOLU-MEDROL) injection  1,000 mg Intravenous Q24H  . pantoprazole (PROTONIX) IV  40 mg Intravenous Q24H  . polyethylene glycol  17 g Oral Daily    Assessment/Plan: Multiple Sclerosis   Assessment:  Patient s/p 2 doses of Solumedrol.  Now agrees to LP.  Has been placed on Lovenox and will not be able to perform today.   Plan: 1.  D/C Lovenox  2.  SCD's  3.  LP in AM  4.  Day #3 of Solumedrol today.    5.  Continue PT   LOS: 3 days   Thana Farr, MD [facility] [phone] 04/18/2012  12:22 PM

## 2012-04-18 NOTE — Consult Note (Signed)
Cross cover [facility] Reason for Consult: ?Sigmoid volvulus. Referring Physician: THP  Michelle Hodges is an 67 y.o. female.  HPI: 67 years old black female, admitted for further workup for dizziness and right sided weakness that she has had for 2 years. She is awaiting a LP today ?MS. She complains of a 1 year history of LLQ pain and bloating with intermittent diarrhea alternating with bouts of constipation. 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 She denies a history of melena or hematochezia. She has a good appetite but has lost 30 lbs over the last 1-2 years. She denies having any dysphagia, odynophagia, nausea, vomiting, jaundice or colitis. She has never had a colonoscopy.    Past Medical History  Diagnosis Date  . History of blood transfusion         Hypercholesterolemia       Peripheral vascular disease       [place] Past Surgical History  Procedure Laterality Date  . Tonsillectomy     No family history on file.  Social History:  reports that she has never smoked. She does not have any smokeless tobacco history on file. She reports that  drinks alcohol. She reports that she uses illicit drugs (Marijuana).  Allergies: No Known Allergies  Medications: I have reviewed the patient's current medications.  Results for orders placed during the hospital encounter of 04/15/12 (from the past 48 hour(s))  GLUCOSE, CAPILLARY     Status: Abnormal   Collection Time    04/16/12 10:04 PM      Result Value Range   Glucose-Capillary 172 (*) 70 - 99 mg/dL  GLUCOSE, CAPILLARY     Status: Abnormal   Collection Time    04/17/12  7:07 AM      Result Value Range   Glucose-Capillary 162 (*) 70 - 99 mg/dL  GLUCOSE, CAPILLARY     Status: Abnormal   Collection Time    04/17/12 12:00 PM      Result Value Range   Glucose-Capillary 156 (*) 70 - 99 mg/dL  GLUCOSE, CAPILLARY     Status: Abnormal   Collection Time    04/17/12  4:35  PM      Result Value Range   Glucose-Capillary 159 (*) 70 - 99 mg/dL   Comment 1 Documented in Chart     Comment 2 Notify RN    GLUCOSE, CAPILLARY     Status: Abnormal   Collection Time    04/17/12  9:20 PM      Result Value Range   Glucose-Capillary 158 (*) 70 - 99 mg/dL   Comment 1 Documented in Chart     Comment 2 Notify RN    GLUCOSE, CAPILLARY     Status: Abnormal   Collection Time    04/18/12  7:24 AM      Result Value Range   Glucose-Capillary 139 (*) 70 - 99 mg/dL   Comment 1 Documented in Chart     Comment 2 Notify RN     Ct Abdomen Pelvis W Contrast  04/17/2012  *RADIOLOGY REPORT*  Clinical Data: Abdominal distention.  Weight loss.  CT ABDOMEN AND PELVIS WITH CONTRAST  Technique:  Multidetector CT imaging of the abdomen and pelvis was performed following the standard protocol during bolus administration of intravenous contrast.  Contrast: 80mL OMNIPAQUE IOHEXOL 300 MG/ML  SOLN  Comparison: None.  Findings: The patient has a redundant distended  colon.  There is extensive stool and air in the colon.  The patient has a very elongated sigmoid colon which lies anteriorly in the abdomen.  The distal sigmoid and rectum are not distended.  I do not see a volvulus but there may be a functional obstruction in the mid sigmoid due to the stool filled and air filled loops of bowel. There is no small bowel distention.  The liver, spleen, pancreas, biliary tree, adrenal glands, and kidneys appear normal.  There is no free air or free fluid.  Uterus appears normal.  The ovaries are not identified.  No significant osseous abnormality.  IMPRESSION: Extensive stool and air in the colon.  However, the distal sigmoid and rectum are empty. The proximal sigmoid is quite redundant and dilated with air. I do not see a definite sigmoid volvulus but there may be functional obstruction of the distal sigmoid due  to compression by the other loops of bowel.  An incomplete sigmoid volvulus could also give this  appearance.   Critical Value/emergent results were called by telephone at the time of interpretation on 04/17/2012 at 6:35 p.m. to the patient's nurse, Carley Hammed, who verbally acknowledged these results.   Original Report Authenticated By: Francene Boyers, M.D.    Review of Systems  Constitutional: Positive for weight loss and malaise/fatigue. Negative for fever, chills and diaphoresis.  Respiratory: Negative.   Cardiovascular: Negative.   Gastrointestinal: Positive for abdominal pain and diarrhea. Negative for heartburn, nausea, vomiting, constipation, blood in stool and melena.  Genitourinary: Negative.   Musculoskeletal: Negative.   Skin: Negative.   Neurological: Positive for dizziness, tingling, sensory change, focal weakness, loss of consciousness and weakness. Negative for tremors and seizures.  Psychiatric/Behavioral: Negative for depression, suicidal ideas, hallucinations, memory loss and substance abuse. The patient is nervous/anxious and has insomnia.    Blood pressure 144/78, pulse 59, temperature 97.8 F (36.6 C), temperature source Oral, resp. rate 18, height 5\' 8"  (1.727 m), weight 56.7 kg (125 lb), SpO2 100.00%. Physical Exam  Constitutional: She is oriented to person, place, and time. She appears well-developed and well-nourished.  HENT:  Head: Normocephalic and atraumatic.  Eyes: Conjunctivae and EOM are normal. Pupils are equal, round, and reactive to light.  Neck: Normal range of motion. Neck supple.  Cardiovascular: Normal rate and regular rhythm.   Respiratory: Effort normal and breath sounds normal.  GI: Soft. Bowel sounds are normal. She exhibits distension. She exhibits no mass. There is tenderness. There is guarding. There is no rebound.  Musculoskeletal: Normal range of motion.  Neurological: She is alert and oriented to person, place, and time.  Skin: Skin is warm.  Psychiatric: She has a normal mood and affect. Her behavior is normal. Judgment and thought content  normal.   Assessment/Plan: 1) Abnormal weight loss/Change in bowel habits/LLQ pain/?incomplete sigmoid volvulus. Need to rule out a malignancy. Will plan a colonoscopy once her LP has been  done.   Exavier Lina 04/18/2012, 3:44 PM

## 2012-04-19 DIAGNOSIS — G35 Multiple sclerosis: Secondary | ICD-10-CM

## 2012-04-19 DIAGNOSIS — R933 Abnormal findings on diagnostic imaging of other parts of digestive tract: Secondary | ICD-10-CM

## 2012-04-19 DIAGNOSIS — R634 Abnormal weight loss: Secondary | ICD-10-CM

## 2012-04-19 LAB — CSF CELL COUNT WITH DIFFERENTIAL
RBC Count, CSF: 990 /mm3 — ABNORMAL HIGH
WBC, CSF: 8 /mm3 — ABNORMAL HIGH (ref 0–5)

## 2012-04-19 LAB — GLUCOSE, CAPILLARY
Glucose-Capillary: 121 mg/dL — ABNORMAL HIGH (ref 70–99)
Glucose-Capillary: 102 mg/dL — ABNORMAL HIGH (ref 70–99)
Glucose-Capillary: 125 mg/dL — ABNORMAL HIGH (ref 70–99)

## 2012-04-19 MED ORDER — PEG-KCL-NACL-NASULF-NA ASC-C 100 G PO SOLR
1.0000 | Freq: Once | ORAL | Status: AC
  Administered 2012-04-19: 100 g via ORAL
  Filled 2012-04-19: qty 1

## 2012-04-19 MED ORDER — POLYETHYLENE GLYCOL 3350 17 G PO PACK
17.0000 g | PACK | ORAL | Status: AC
  Administered 2012-04-19 (×2): 17 g via ORAL
  Filled 2012-04-19 (×2): qty 1

## 2012-04-19 NOTE — Progress Notes (Signed)
Patient seen, examined, and I agree with the above documentation, including the assessment and plan. Plan for colonoscopy tomorrow as above. Further recommendations thereafter
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
## 2012-04-19 NOTE — Progress Notes (Signed)
Met with patient and family at bedside to evaluate and discuss possible inpatient rehab admission. Patient lives alone and was independent prior to this hospitalization. She would benefit from  CIR in order to return home at mod independent level.  Patient is eager to come to rehab. Daughters are available to assist as needed after discharge. Pt had LP today and is prepping for a colonosocopy tomorrow. Will follow up with pt in AM for possible admission after her procedure.  For questions, call [phone].

## 2012-04-19 NOTE — Progress Notes (Signed)
Physical Therapy Treatment Patient Details Name: Michelle Hodges MRN: [id number] DOB: 11-02-1945 Today's Date: 04/19/2012 Time: 4098-1191 PT Time Calculation (min): 38 min  PT Assessment / Plan / Recommendation Comments on Treatment Session  Pt progressing with mobility & PT goals as evident in ability to increase ambulation distance & decreased (A) for mobility however pt still not at a level to safely d/c home alone therefore cont to recommend CIR.  Pt would benefit from stair training next session as pt indicated she had been having issues going up and down stairs.  Pt was educated and demonstrated proper technique with RW.  Pt indicated she feels more secure when using RW for stability.      Follow Up Recommendations   CIR; Home Health PT; Supervision for mobility/OOB     Does the patient have the potential to tolerate intense rehabilitation     Barriers to Discharge        Equipment Recommendations       Recommendations for Other Services  Rehab consult; OT consult  Frequency   4x/week  Plan      Precautions / Restrictions Precautions Precautions: Fall Restrictions Weight Bearing Restrictions: No   Pertinent Vitals/Pain Pt indicated she was feeling well this session.    Mobility  Bed Mobility Bed Mobility: Supine to Sit;Sitting - Scoot to Edge of Bed Supine to Sit: 5: Supervision Sitting - Scoot to Edge of Bed: 5: Supervision Transfers Transfers: Stand to Sit;Sit to Stand Sit to Stand: 4: Min guard;From bed;With upper extremity assist Stand to Sit: 4: Min assist;With upper extremity assist;To chair/3-in-1 Details for Transfer Assistance: Pt relies on UEs to go from sit to stand.  Pt indicated she still feels unstable and wanted to use the RW for support during sit>stand.  Cue for hand placement during stand>sit.   Ambulation/Gait Ambulation/Gait Assistance: 4: Min guard Ambulation Distance (Feet): 175 Feet Assistive device: 4-wheeled walker Ambulation/Gait  Assistance Details: Pt indicated she still feels unstable and prefered to use RW for stablility during ambulation.  Pt educated on proper technique to stay within walker, with upright posture, and rolling instead of picking up RW.  Pt also encouraged to increase gait speed.  Pt gait guarded and stiff. Gait Pattern: Step-through pattern;Decreased stride length Gait velocity: decreased General Gait Details: Pt gait guarded and stiff.  Pt able to slightly increase gait speed with encouragement, still decreased. Stairs: No      PT Goals Acute Rehab PT Goals Time For Goal Achievement: 04/23/12 Potential to Achieve Goals: Good Pt will go Sit to Stand: Independently PT Goal: Sit to Stand - Progress: Progressing toward goal Pt will go Stand to Sit: Independently PT Goal: Stand to Sit - Progress: Progressing toward goal Pt will Ambulate: >150 feet;Independently PT Goal: Ambulate - Progress: Progressing toward goal  Visit Information  Last PT Received On: 04/19/12 Assistance Needed: +1    Subjective Data  Subjective: Pt feeling well.  Feels more stable with support from RW.   Cognition  Cognition Overall Cognitive Status: Impaired Area of Impairment: Memory Arousal/Alertness: Awake/alert Orientation Level: Appears intact for tasks assessed Behavior During Session: [facility] for tasks performed    Balance  Balance Balance Assessed: Yes Static Standing Balance Static Standing - Balance Support: No upper extremity supported Static Standing - Level of Assistance: 4: Min assist Static Standing - Comment/# of Minutes: 2 Single Leg Stance - Right Leg:  (Needs Mod (A) or UE support) Single Leg Stance - Left Leg: 1 (Min (A)) Tandem Stance -  Right Leg:  (can not maintain without mod (A) or UE support) Tandem Stance - Left Leg: 1 Rhomberg - Eyes Closed: 1 Dynamic Standing Balance Dynamic Standing - Balance Support: No upper extremity supported Dynamic Standing - Level of Assistance: 4: Min  assist ( especially when weight shifting to right.) Dynamic Standing - Balance Activities: Reaching for objects;Reaching across midline Dynamic Standing - Comments: Pt need more assistance when wt shifting to right side.  End of Session PT - End of Session Equipment Utilized During Treatment: Gait belt Activity Tolerance: Patient tolerated treatment well Patient left: in chair;with call bell/phone within reach;with family/visitor present Nurse Communication: Mobility status   GP     Enid Baas, SPTA 04/19/2012, 9:54 AM   Verdell Face, PTA [phone] 04/19/2012

## 2012-04-19 NOTE — Progress Notes (Signed)
     [facility] Daily Rounding Note 04/19/2012, 11:32 AM  Note that pt does not have a primary MD. Family is working on obtaining one.   SUBJECTIVE:       No abd pain.  No nausea.  LP just completed  OBJECTIVE:         Vital signs in last 24 hours:    Temp:  [97.8 F (36.6 C)-98.1 F (36.7 C)] 98.1 F (36.7 C) (03/16 1737) Pulse Rate:  [57-62] 57 (03/17 1045) Resp:  [18-20] 18 (03/17 1045) BP: (137-152)/(78-87) 152/87 mmHg (03/17 1045) SpO2:  [100 %] 100 % (03/17 1045) Last BM Date: 04/18/12 General: looks well   Heart: RRR Chest: clear B Abdomen: soft, ND, NT.  Active BS Extremities: no CCE Neuro/Psych:  Pleasant, NAD.  Appropriate.   Intake/Output from previous day: 03/16 0701 - 03/17 0700 In: 760 [P.O.:760] Out: -   Intake/Output this shift: Total I/O In: 360 [P.O.:360] Out: -    Studies/Results: Ct Abdomen Pelvis W Contrast 04/17/2012  IMPRESSION: Extensive stool and air in the colon.  However, the distal sigmoid and rectum are empty. The proximal sigmoid is quite redundant and dilated with air. I do not see a definite sigmoid volvulus but there may be functional obstruction of the distal sigmoid due  to compression by the other loops of bowel.  An incomplete sigmoid volvulus could also give this appearance.   Critical Value/emergent results were called by telephone at the time of interpretation on 04/17/2012 at 6:35 p.m. to the patient's nurse, Carley Hammed, who verbally acknowledged these results.   Original Report Authenticated By: Francene Boyers, M.D.     ASSESMENT: * >  One year of bloating and LLQ pain. 30 # weight loss. Alternating bowel habits. CT scan with ? Of compression of redundant sigmoid by adjacent loops of redundant bowel.  *  Right sided weakness, dizziness, behavioral changes.  LP to rule out MS done on 3/17.    PLAN: *   Colonoscopy 1230 PM tomorrow,  Split prep tonight, start with laxatives this  AM.      LOS: 4 days   Jennye Moccasin  04/19/2012, 11:32  AM Pager: [phone]

## 2012-04-19 NOTE — Progress Notes (Signed)
[facility]             Progress Note   Subjective: Has decided to proceed with LP. Colonoscopy scheduled for tomorrow. Objective: Vital signs in last 24 hours: Temp:  [97.8 F (36.6 C)-98.1 F (36.7 C)] 98.1 F (36.7 C) (03/16 1737) Pulse Rate:  [57-62] 57 (03/17 1045) Resp:  [18-20] 18 (03/17 1045) BP: (137-152)/(78-87) 152/87 mmHg (03/17 1045) SpO2:  [100 %] 100 % (03/17 1045) Weight change:  Last BM Date: 04/18/12  Intake/Output from previous day: 03/16 0701 - 03/17 0700 In: 760 [P.O.:760] Out: -  Total I/O In: 360 [P.O.:360] Out: -    Physical Exam: General: Alert, awake, oriented x3. HEENT: No bruits, no goiter. Heart: Regular rate and rhythm, without murmurs, rubs, gallops. Lungs: Clear to auscultation bilaterally. Abdomen: distended, +BS. Extremities: No clubbing cyanosis or edema with positive pedal pulses.    Lab Results: Basic Metabolic Panel: No results found for this basename: NA, K, CL, CO2, GLUCOSE, BUN, CREATININE, CALCIUM, MG, PHOS,  in the last 72 hours Liver Function Tests: No results found for this basename: AST, ALT, ALKPHOS, BILITOT, PROT, ALBUMIN,  in the last 72 hours CBC: No results found for this basename: WBC, NEUTROABS, HGB, HCT, MCV, PLT,  in the last 72 hours Cardiac Enzymes: No results found for this basename: CKTOTAL, CKMB, CKMBINDEX, TROPONINI,  in the last 72 hours CBG:  Recent Labs  04/18/12 0724 04/18/12 1153 04/18/12 1636 04/18/12 2205 04/19/12 0707 04/19/12 1139  GLUCAP 139* 109* 109* 191* 121* 106*   Hemoglobin A1C: No results found for this basename: HGBA1C,  in the last 72 hours Fasting Lipid Panel: No results found for this basename: CHOL, HDL, LDLCALC, TRIG, CHOLHDL, LDLDIRECT,  in the last 72 hours Thyroid Function Tests: No results found for this basename: TSH, T4TOTAL, FREET4, T3FREE, THYROIDAB,  in the last 72 hours Anemia Panel: No results found for this basename: VITAMINB12, FOLATE,  FERRITIN, TIBC, IRON, RETICCTPCT,  in the last 72 hours Coagulation: No results found for this basename: LABPROT, INR,  in the last 72 hours Urine Drug Screen: Drugs of Abuse     Component Value Date/Time   LABOPIA NONE DETECTED 04/16/2012 0540   COCAINSCRNUR NONE DETECTED 04/16/2012 0540   LABBENZ NONE DETECTED 04/16/2012 0540   AMPHETMU NONE DETECTED 04/16/2012 0540   THCU POSITIVE* 04/16/2012 0540   LABBARB NONE DETECTED 04/16/2012 0540    Urinalysis: No results found for this basename: COLORURINE, APPERANCEUR, LABSPEC, PHURINE, GLUCOSEU, HGBUR, BILIRUBINUR, KETONESUR, PROTEINUR, UROBILINOGEN, NITRITE, LEUKOCYTESUR,  in the last 72 hours  Studies/Results: Ct Abdomen Pelvis W Contrast  04/17/2012  *RADIOLOGY REPORT*  Clinical Data: Abdominal distention.  Weight loss.  CT ABDOMEN AND PELVIS WITH CONTRAST  Technique:  Multidetector CT imaging of the abdomen and pelvis was performed following the standard protocol during bolus administration of intravenous contrast.  Contrast: 80mL OMNIPAQUE IOHEXOL 300 MG/ML  SOLN  Comparison: None.  Findings: The patient has a redundant distended  colon.  There is extensive stool and air in the colon.  The patient has a very elongated sigmoid colon which lies anteriorly in the abdomen.  The distal sigmoid and rectum are not distended.  I do not see a volvulus but there may be a functional obstruction in the mid sigmoid due to the stool filled and air filled loops of bowel. There is no small bowel distention.  The liver, spleen, pancreas, biliary tree, adrenal glands, and kidneys appear normal.  There is no free air or  free fluid.  Uterus appears normal.  The ovaries are not identified.  No significant osseous abnormality.  IMPRESSION: Extensive stool and air in the colon.  However, the distal sigmoid and rectum are empty. The proximal sigmoid is quite redundant and dilated with air. I do not see a definite sigmoid volvulus but there may be functional obstruction of the  distal sigmoid due  to compression by the other loops of bowel.  An incomplete sigmoid volvulus could also give this appearance.   Critical Value/emergent results were called by telephone at the time of interpretation on 04/17/2012 at 6:35 p.m. to the patient's nurse, Carley Hammed, who verbally acknowledged these results.   Original Report Authenticated By: Francene Boyers, M.D.     Medications: Scheduled Meds: . aspirin  325 mg Oral Daily  . docusate sodium  100 mg Oral BID  . methylPREDNISolone (SOLU-MEDROL) injection  1,000 mg Intravenous Q24H  . peg 3350 powder  1 kit Oral Once  . polyethylene glycol  17 g Oral Daily  . polyethylene glycol  17 g Oral Q4H   Continuous Infusions: . sodium chloride 75 mL/hr at 04/17/12 1027   PRN Meds:.  Assessment/Plan:  Active Problems:   Demyelinating disease of central nervous system   History of CVA (cerebrovascular accident)   Weakness of right side of body   Multiple sclerosis   Abdominal distention   Loss of weight   Right-sided weakness -MRI without CVA. -It appears she has a demyelinating CNS disorder ?MS. -Neuro following and has recommended 3 days of 1000 mg solumedrol. -LP to be done today. -CIR consult requested 3/14.  Abdominal Distention -CT scan: Extensive stool and air in the colon. However, the distal sigmoid  and rectum are empty. The proximal sigmoid is quite redundant and  dilated with air. I do not see a definite sigmoid volvulus but  there may be functional obstruction of the distal sigmoid due to  compression by the other loops of bowel. An incomplete sigmoid  volvulus could also give this appearance. -She has had diarrhea and bloating for at least a year. -Will need need a colonoscopy for both decompressive reasons and also to rule out a malignancy.  Time spent coordinating care: 35 minutes.   LOS: 4 days   [facility] Pager: [phone] 04/19/2012, 12:13 PM

## 2012-04-19 NOTE — Procedures (Signed)
LP Procedure Note:  Patient has been seen and examined.  Discussion had with family and patient.  Chart has been reviewed.  LP is being performed for the diagnosis of MS.  Procedure has been explained to patient/family including risks and benefits.  Consent has been signed by patient/family and witnessed.   Blood pressure 152/87, pulse 57, temperature 98.1 F (36.7 C), temperature source Oral, resp. rate 18, height 5\' 8"  (1.727 m), weight 56.7 kg (125 lb), SpO2 100.00%.  Current facility-administered medications:0.9 %  sodium chloride infusion, , Intravenous, Continuous, Richarda Overlie, MD, Last Rate: 75 mL/hr at 04/17/12 1027;  aspirin tablet 325 mg, 325 mg, Oral, Daily, Richarda Overlie, MD, 325 mg at 04/19/12 0959;  docusate sodium (COLACE) capsule 100 mg, 100 mg, Oral, BID, Henderson Cloud, MD, 100 mg at 04/19/12 4098 methylPREDNISolone sodium succinate (SOLU-MEDROL) 1,000 mg in sodium chloride 0.9 % 50 mL IVPB, 1,000 mg, Intravenous, Q24H, Thana Farr, MD, 1,000 mg at 04/18/12 1630;  pantoprazole (PROTONIX) injection 40 mg, 40 mg, Intravenous, Q24H, Thana Farr, MD, 40 mg at 04/17/12 1652;  polyethylene glycol (MIRALAX / GLYCOLAX) packet 17 g, 17 g, Oral, Daily, Henderson Cloud, MD, 17 g at 04/19/12 0959  No results found for this basename: WBC, HGB, HCT, PLT, PT, INR, PTT,  in the last 72 hours  MR of head:  1. No evidence for acute or subacute infarction.  2. 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  3. Remote bilateral cortical and subcortical infarcts.  4. Moderate sinus disease with evidence for acute sinusitis in the  left maxillary sinus.   Patient was placed in the lateral sitting position.  Area was cleaned with betadine and anesthetized with lidocaine.  Under sterile conditions 20G LP needle was placed at approximately L3-4 without difficulty.  Opening pressure was documented at 30.  Approximately 20cc of clear and  colorless fluid was obtained and sent for studies.  No complications were noted.    Thana Farr, MD [facility] [phone] 04/19/2012  11:26 AM

## 2012-04-20 ENCOUNTER — Encounter (HOSPITAL_COMMUNITY): Payer: Self-pay | Admitting: *Deleted

## 2012-04-20 ENCOUNTER — Encounter (HOSPITAL_COMMUNITY)
Admission: EM | Disposition: A | Discharge status: Rehabilitation Facility | Payer: Self-pay | Source: Home / Self Care | Attending: Internal Medicine

## 2012-04-20 DIAGNOSIS — K5939 Other megacolon: Secondary | ICD-10-CM

## 2012-04-20 HISTORY — PX: COLONOSCOPY: SHX5424

## 2012-04-20 LAB — GLUCOSE, CAPILLARY: Glucose-Capillary: 123 mg/dL — ABNORMAL HIGH (ref 70–99)

## 2012-04-20 SURGERY — COLONOSCOPY
Anesthesia: Moderate Sedation

## 2012-04-20 MED ORDER — FENTANYL CITRATE 0.05 MG/ML IJ SOLN
INTRAMUSCULAR | Status: AC
  Filled 2012-04-20: qty 4

## 2012-04-20 MED ORDER — FENTANYL CITRATE 0.05 MG/ML IJ SOLN
INTRAMUSCULAR | Status: DC | PRN
  Administered 2012-04-20 (×2): 25 ug via INTRAVENOUS

## 2012-04-20 MED ORDER — SODIUM CHLORIDE 0.9 % IV SOLN
INTRAVENOUS | Status: DC
  Administered 2012-04-20: 500 mL via INTRAVENOUS

## 2012-04-20 MED ORDER — MIDAZOLAM HCL 5 MG/ML IJ SOLN
INTRAMUSCULAR | Status: AC
  Filled 2012-04-20: qty 2

## 2012-04-20 MED ORDER — MIDAZOLAM HCL 5 MG/5ML IJ SOLN
INTRAMUSCULAR | Status: DC | PRN
  Administered 2012-04-20 (×2): 2 mg via INTRAVENOUS

## 2012-04-20 MED ORDER — PEG-KCL-NACL-NASULF-NA ASC-C 100 G PO SOLR
1.0000 | Freq: Once | ORAL | Status: AC
  Administered 2012-04-20: 100 g via ORAL
  Filled 2012-04-20: qty 1
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
## 2012-04-20 NOTE — Discharge Summary (Signed)
Physician Discharge Summary  Patient ID: Michelle Hodges MRN: [id number] DOB/AGE: [birth year] 67 y.o.  Admit date: 04/15/2012 Discharge date: 04/20/2012  Primary Care Physician:  Oliver Barre, MD   Discharge Diagnoses:    Active Problems:   Demyelinating disease of central nervous system   History of CVA (cerebrovascular accident)   Weakness of right side of body   Multiple sclerosis   Abdominal distention   Loss of weight   Dilatation of colon      Medication List     As of 04/20/2012 12:10 PM    Notice      You have not been prescribed any medications.          Disposition and Follow-up:  Will be discharged to CIR today.  Consults:  Neurology, Dr. Thad Ranger   Significant Diagnostic Studies:  No results found.  Brief H and P: For complete details please refer to admission H and P, but in brief patient is a 67 year old female who presents to the ER via EMS after co-workers were concerned about her peculiar behavior/weakness. Daughter reports that the patient has had worsening of her right lower extremity weakness that originally had an acute onset 2 years ago, but was never evaluated for and is described as dragging leg with ambulation. She's never had a stroke. In addition patient has been dropping things more than usual with hands, also right-sided. Today at work patient was noted to have an episode of disequilibrium and facial droop which has since resolved. Patient has never been evaluated by a cardiologist or a neurologist and daughter reports that they have made her appointments however she prefers herbal medicines and is non-compliant with appointments. She's also lost 30 pounds and has never had a colonoscopy. She feels that her abdomen is bloated, she has had diarrhea alternating with constipation. Patient denies any current change in vision, dizziness, chest pain, shortness of breath, dyspnea on exertion, decreased coordination. We were asked to admit her for further  evaluation and management.     Hospital Course:  Active Problems:   Demyelinating disease of central nervous system   History of CVA (cerebrovascular accident)   Weakness of right side of body   Multiple sclerosis   Abdominal distention   Loss of weight   Dilatation of colon   Demyelinating Disease of CNS -Thought to be MS. -Has been followed by neurology. -LP done. -She has also received 3 doses of high-dose solumedrol. -Will be going to CIR today.  Abdominal Distention/Weight Loss -Colonoscopy was incomplete. -There are plans for a barium enema. -If no malignancy, then will not need GI follow up upon DC.   Time spent on Discharge: Greater than 30 minutes.  SignedChaya Jan [facility] Pager: [phone] 04/20/2012, 12:10 PM

## 2012-04-20 NOTE — Progress Notes (Signed)
Met with patient and her daughter this AM. Pt is eager to come to inpatient rehab. Colonoscopy completed and barium enema planned for tomorrow. Dr Rhea Belton reports pt to have barium enema tomorrow. Dr Ardyth Harps reports pt stable for d/c to CIR after procedures completed. Will continue to follow and possibly admit to CIR tomorrow pending bed availability. For questions, call [phone].

## 2012-04-20 NOTE — Progress Notes (Signed)
PT Cancel Note:     Pt off floor at this time for procedure (colonosocopy).  Will attempt back later today if time allows.     Michelle Hodges, [place] [phone] 04/20/2012

## 2012-04-20 NOTE — Progress Notes (Signed)
Occupational Therapy Treatment Patient Details Name: Michelle Hodges MRN: [id number] DOB: [birth year] Today's Date: 04/20/2012 Time: 1191-4782 OT Time Calculation (min): 30 min  OT Assessment / Plan / Recommendation Comments on Treatment Session Pt making good progress. Pt extremely high risk for falls. Pt loses balance easily when challenged outside BOS. While washing hands at sink, required Mod A to keep from falling. Pt unaware of losing balance. When pt is distracted, or challenged with higher level activities, her risk for falls increases. Continue to recommend CIR for D/C plan. pt/daughter agree. Will continue to follow.    Follow Up Recommendations  CIR    Barriers to Discharge       Equipment Recommendations  3 in 1 bedside comode;Tub/shower bench    Recommendations for Other Services Rehab consult  Frequency Min 3X/week   Plan Discharge plan remains appropriate    Precautions / Restrictions Precautions Precautions: Fall   Pertinent Vitals/Pain C/o neck pain. Warm heat pack placed with towel. Daughter present    ADL  ADL Comments: focus of session on functional mobility with increasing challenge to simulate home. Pt had 4 LOB requiring  Mod A to correct. Pt with poor awareness of deficits    OT Diagnosis:    OT Problem List:   OT Treatment Interventions:     OT Goals Acute Rehab OT Goals OT Goal Formulation: With patient Time For Goal Achievement: 05/01/12 ADL Goals Pt Will Perform Grooming: with supervision;Standing at sink;with cueing (comment type and amount) ADL Goal: Grooming - Progress: Progressing toward goals Pt Will Perform Lower Body Bathing: with caregiver independent in assisting;Sit to stand from chair;Supported;with supervision ADL Goal: Lower Body Bathing - Progress: Progressing toward goals Pt Will Perform Lower Body Dressing: with caregiver independent in assisting;Sit to stand from chair;with supervision ADL Goal: Lower Body Dressing - Progress:  Progressing toward goals Pt Will Transfer to Toilet: with supervision;Ambulation ADL Goal: Toilet Transfer - Progress: Progressing toward goals Pt Will Perform Toileting - Clothing Manipulation: with supervision;Standing ADL Goal: Toileting - Clothing Manipulation - Progress: Progressing toward goals Pt Will Perform Toileting - Hygiene: with supervision;with caregiver independent in assisting;Standing at 3-in-1/toilet ADL Goal: Toileting - Hygiene - Progress: Progressing toward goals Arm Goals Pt Will Complete Theraputty Exer: with supervision, verbal cues required/provided;Right upper extremity;Mod resistance putty  Visit Information  Last OT Received On: 04/20/12    Subjective Data      Prior Functioning       Cognition  Cognition Overall Cognitive Status: Impaired Area of Impairment: Memory Arousal/Alertness: Awake/alert Orientation Level: Appears intact for tasks assessed Behavior During Session: [facility] for tasks performed Memory: Decreased recall of precautions Memory Deficits: decreased STM Safety/Judgement: Decreased awareness of safety precautions Awareness of Deficits: Pt has had 7 falls recently Cognition - Other Comments: Family reports changes in cognitiion    Mobility  Transfers Transfers: Sit to Stand;Stand to Sit Sit to Stand: 4: Min assist Stand to Sit: 4: Min assist Details for Transfer Assistance: posterior lean    Exercises      Balance Dynamic Standing Balance Dynamic Standing - Balance Support: During functional activity Dynamic Standing - Level of Assistance: 3: Mod assist Dynamic Standing - Comments: LOB x 4 when distracted   End of Session OT - End of Session Equipment Utilized During Treatment: Gait belt Activity Tolerance: Patient tolerated treatment well Patient left: in chair;with call bell/phone within reach;with family/visitor present Nurse Communication: Mobility status  GO     Gillian Meeuwsen,HILLARY 04/20/2012, 7:21 PM [facility] Fabiana Dromgoole, OTR/L  [phone] 04/20/2012 

## 2012-04-20 NOTE — Progress Notes (Signed)
Informed Dr Ardyth Harps that patient will not be admitted to CIR today d/t repeat colonoscopy tomorrow. Admission to CIR will be pending bed availability.  For questions, call [phone].

## 2012-04-20 NOTE — Progress Notes (Signed)
Subjective: Patient continues without complaints s/p LP.  Ambulating.  Felt to be doing too well for CIR.  Has completed course of Solumedrol.    Objective: Current vital signs: BP 137/83  Pulse 48  Temp(Src) 97.5 F (36.4 C) (Oral)  Resp 15  Ht 5\' 8"  (1.727 m)  Wt 56.7 kg (125 lb)  BMI 19.01 kg/m2  SpO2 94% Vital signs in last 24 hours: Temp:  [97.5 F (36.4 C)-98.6 F (37 C)] 97.5 F (36.4 C) (03/18 0927) Pulse Rate:  [48-66] 48 (03/18 0812) Resp:  [13-34] 15 (03/18 1010) BP: (130-146)/(70-89) 137/83 mmHg (03/18 1010) SpO2:  [90 %-100 %] 94 % (03/18 1010)  Intake/Output from previous day: 03/17 0701 - 03/18 0700 In: 1120 [P.O.:1120] Out: -  Intake/Output this shift: Total I/O In: 350 [I.V.:350] Out: -  Nutritional status: Clear Liquid  Neurologic Exam: Mental Status:  Alert, oriented, thought content appropriate. Speech fluent without evidence of aphasia. Able to follow 3 step commands without difficulty.  Cranial Nerves:  II: visual fields grossly normal, pupils equal, round, reactive to light and accommodation  III,IV, VI: ptosis not present, extra-ocular motions intact bilaterally  V,VII: smile symmetric, facial light touch sensation normal bilaterally  VIII: hearing normal bilaterally  IX,X: gag reflex present  XI: trapezius strength/neck flexion strength normal bilaterally  XII: tongue strength normal  Motor:  Right : Upper extremity 5-/5           Left: Upper extremity 5/5   Lower extremity 5-/5        Lower extremity 5/5  Tone and bulk:normal tone throughout; no atrophy noted  Sensory: Pinprick and light touch intact throughout, bilaterally  Deep Tendon Reflexes: 2+ throughout with absent AJ's bilaterally  Plantars:  Right: upoing      Left: upgoing  Cerebellar:  normal finger-to-nose; dysmetric heel to shin on the right  Gait: deliberate    Lab Results: Basic Metabolic Panel:  Recent Labs Lab 04/15/12 1345  NA 142  K 4.4  CL 108  CO2 26   GLUCOSE 83  BUN 16  CREATININE 0.80  CALCIUM 9.3    Liver Function Tests:  Recent Labs Lab 04/15/12 1345  AST 27  ALT 25  ALKPHOS 46  BILITOT 0.4  PROT 6.2  ALBUMIN 3.4*   No results found for this basename: LIPASE, AMYLASE,  in the last 168 hours No results found for this basename: AMMONIA,  in the last 168 hours  CBC:  Recent Labs Lab 04/15/12 1345  WBC 8.0  NEUTROABS 4.7  HGB 10.9*  HCT 32.9*  MCV 92.7  PLT 181    Cardiac Enzymes:  Recent Labs Lab 04/15/12 1346  TROPONINI <0.30    Lipid Panel:  Recent Labs Lab 04/16/12 0529  CHOL 154  TRIG 71  HDL 56  CHOLHDL 2.8  VLDL 14  LDLCALC 84    CBG:  Recent Labs Lab 04/19/12 1139 04/19/12 1647 04/19/12 2159 04/20/12 0642 04/20/12 1206  GLUCAP 106* 102* 125* 133* 123*    Microbiology: Results for orders placed during the hospital encounter of 04/15/12  GRAM STAIN     Status: None   Collection Time    04/19/12 10:20 AM      Result Value Range Status   Specimen Description CSF   Final   Special Requests 2.3ML FLUID   Final   Gram Stain     Final   Value: CYTOSPIN SLIDE:     SQUAMOUS EPITHELIAL CELLS PRESENT     NO  WBC SEEN     NO ORGANISMS SEEN   Report Status 04/19/2012 FINAL   Final  CSF CULTURE     Status: None   Collection Time    04/19/12 10:20 AM      Result Value Range Status   Specimen Description CSF   Final   Special Requests 2.3ML FLUID   Final   Gram Stain     Final   Value: NO WBC SEEN     NO ORGANISMS SEEN     Performed at [facility]   Culture NO GROWTH 1 DAY   Final   Report Status PENDING   Incomplete  FUNGUS CULTURE W SMEAR     Status: None   Collection Time    04/19/12 10:20 AM      Result Value Range Status   Specimen Description CSF   Final   Special Requests 2.3ML FL   Final   Fungal Smear NO YEAST OR FUNGAL ELEMENTS SEEN   Final   Culture CULTURE IN PROGRESS FOR FOUR WEEKS   Final   Report Status PENDING   Incomplete     Coagulation Studies: No results found for this basename: LABPROT, INR,  in the last 72 hours  Imaging: No results found.  Medications:  I have reviewed the patient's current medications. Scheduled: . aspirin  325 mg Oral Daily  . docusate sodium  100 mg Oral BID  . methylPREDNISolone (SOLU-MEDROL) injection  1,000 mg Intravenous Q24H  . peg 3350 powder  1 kit Oral Once  . 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  LP completed.  Initial studies show normal protein and glucose.  Awaiting studies for MS.  Cryptococcal antigen negative.     Plan:  1. Patient to follow up with  Neurology on an outpatient basis.  LP results can be followed up at that time.               2. PT as an outpatient   LOS: 5 days   Thana Farr, MD [facility] [phone] 04/20/2012  1:52 PM

## 2012-04-20 NOTE — Op Note (Signed)
[facility] [address] [place], [address]   COLONOSCOPY PROCEDURE REPORT  PATIENT: Michelle Hodges, Michelle Hodges  MR#: [id number] BIRTHDATE: 09-Nov-1945 , 66  yrs. old GENDER: Female ENDOSCOPIST: Beverley Fiedler, MD REFERRED XB:[id number], [facility] PROCEDURE DATE:  04/20/2012 PROCEDURE:   Colonoscopy, diagnostic ASA CLASS:   Class III INDICATIONS:Change in bowel habits, Abdominal pain, Bloating, and an abnormal CT. MEDICATIONS: These medications were titrated to patient response per physician's verbal order, Fentanyl 50 mcg IV, and Versed 4 mg IV  DESCRIPTION OF PROCEDURE:   After the risks benefits and alternatives of the procedure were thoroughly explained, informed consent was obtained.  A digital rectal exam revealed no rectal mass.   The Pentax Ped Colon [id number]  endoscope was introduced through the anus and advanced to the mid transverse colon. No adverse events experienced.   The quality of the prep was poor, using MoviPrep  The instrument was then slowly withdrawn as the colon was fully examined.     COLON FINDINGS: The lumen was significantly dilated in the left colon (there was air in the rectum and distal sigmoid).  The preparation was poor despite completion of her oral preparation). The colon was redundant in the sigmoid.   There was no evidence of left colon mass or true volvulus seen in the left colon.  Despite manual pressure the colonoscope was unable to be advanced past the mid to distal transverse colon do to poor preparation, dilated lumen, and significant redundancy. Air was removed is much as possible.  Retroflexion was not performed due to the presence of stool.       The scope was withdrawn and the procedure completed.  COMPLICATIONS: There were no complications.  ENDOSCOPIC IMPRESSION: 1.   The lumen was dilated with significant redundancy in the left colon 2.   There was no evidence of left colon mass or true volvulus 3.    Incomplete colonoscopy (poor preparation, redundancy and dilation). 4.   No evidence for mass or volvulus in the left colon  RECOMMENDATIONS: Give additional colon preparation and proceed to barium enema in an attempt to evaluate the entire colon (given the redundancy and dilation encounter today colonoscopy)   eSigned:  Beverley Fiedler, MD 04/20/2012 9:43 AM

## 2012-04-21 ENCOUNTER — Inpatient Hospital Stay (HOSPITAL_COMMUNITY): Payer: Medicare Other

## 2012-04-21 ENCOUNTER — Encounter (HOSPITAL_COMMUNITY): Payer: Self-pay | Admitting: Internal Medicine

## 2012-04-21 ENCOUNTER — Inpatient Hospital Stay (HOSPITAL_COMMUNITY)
Admission: RE | Admit: 2012-04-21 | Discharge: 2012-04-29 | DRG: 946 | Disposition: A | Discharge status: Home Health | Payer: Medicare Other | Source: Intra-hospital | Attending: Physical Medicine & Rehabilitation | Admitting: Physical Medicine & Rehabilitation

## 2012-04-21 DIAGNOSIS — F1210 Cannabis abuse, uncomplicated: Secondary | ICD-10-CM | POA: Diagnosis present

## 2012-04-21 DIAGNOSIS — R143 Flatulence: Secondary | ICD-10-CM

## 2012-04-21 DIAGNOSIS — Z5189 Encounter for other specified aftercare: Principal | ICD-10-CM

## 2012-04-21 DIAGNOSIS — G379 Demyelinating disease of central nervous system, unspecified: Secondary | ICD-10-CM

## 2012-04-21 DIAGNOSIS — M542 Cervicalgia: Secondary | ICD-10-CM | POA: Diagnosis present

## 2012-04-21 DIAGNOSIS — R5381 Other malaise: Secondary | ICD-10-CM | POA: Diagnosis present

## 2012-04-21 DIAGNOSIS — G35 Multiple sclerosis: Secondary | ICD-10-CM

## 2012-04-21 DIAGNOSIS — K5939 Other megacolon: Secondary | ICD-10-CM

## 2012-04-21 DIAGNOSIS — Z8673 Personal history of transient ischemic attack (TIA), and cerebral infarction without residual deficits: Secondary | ICD-10-CM

## 2012-04-21 LAB — GLUCOSE, CAPILLARY
Glucose-Capillary: 85 mg/dL (ref 70–99)
Glucose-Capillary: 82 mg/dL (ref 70–99)
Glucose-Capillary: 70 mg/dL (ref 70–99)

## 2012-04-21 MED ORDER — ONDANSETRON HCL 4 MG PO TABS: 4.0000 mg | ORAL_TABLET | Freq: Four times a day (QID) | ORAL | Status: DC | PRN

## 2012-04-21 MED ORDER — SENNOSIDES-DOCUSATE SODIUM 8.6-50 MG PO TABS
1.0000 | ORAL_TABLET | Freq: Every evening | ORAL | Status: DC | PRN
  Administered 2012-04-25: 1 via ORAL
  Filled 2012-04-21: qty 1

## 2012-04-21 MED ORDER — SORBITOL 70 % SOLN: 30.0000 mL | Freq: Every day | Status: DC | PRN

## 2012-04-21 MED ORDER — ONDANSETRON HCL 4 MG/2ML IJ SOLN: 4.0000 mg | Freq: Four times a day (QID) | INTRAMUSCULAR | Status: DC | PRN

## 2012-04-21 MED ORDER — PANTOPRAZOLE SODIUM 40 MG PO TBEC
40.0000 mg | DELAYED_RELEASE_TABLET | Freq: Every day | ORAL | Status: DC
  Administered 2012-04-21: 40 mg via ORAL
  Filled 2012-04-21: qty 1

## 2012-04-21 MED ORDER — PANTOPRAZOLE SODIUM 40 MG PO TBEC
40.0000 mg | DELAYED_RELEASE_TABLET | Freq: Every day | ORAL | Status: DC
  Administered 2012-04-22 – 2012-04-29 (×8): 40 mg via ORAL
  Filled 2012-04-21 (×8): qty 1

## 2012-04-21 MED ORDER — SENNA 8.6 MG PO TABS: 1.0000 | ORAL_TABLET | Freq: Every day | ORAL | Status: DC

## 2012-04-21 MED ORDER — ACETAMINOPHEN 325 MG PO TABS
325.0000 mg | ORAL_TABLET | ORAL | Status: DC | PRN
  Administered 2012-04-22: 650 mg via ORAL
  Administered 2012-04-23 (×2): 325 mg via ORAL
  Administered 2012-04-23 – 2012-04-24 (×2): 650 mg via ORAL
  Filled 2012-04-21 (×3): qty 2
  Filled 2012-04-21: qty 1
  Filled 2012-04-21: qty 2

## 2012-04-21 MED ORDER — HYDROCODONE-ACETAMINOPHEN 5-325 MG PO TABS
1.0000 | ORAL_TABLET | ORAL | Status: DC | PRN
  Administered 2012-04-21 (×2): 1 via ORAL
  Filled 2012-04-21 (×2): qty 1

## 2012-04-21 MED ORDER — IBUPROFEN 400 MG PO TABS
400.0000 mg | ORAL_TABLET | Freq: Four times a day (QID) | ORAL | Status: DC | PRN
  Filled 2012-04-21: qty 1

## 2012-04-21 MED ORDER — ASPIRIN 325 MG PO TABS
325.0000 mg | ORAL_TABLET | Freq: Every day | ORAL | Status: DC
  Administered 2012-04-22 – 2012-04-29 (×8): 325 mg via ORAL
  Filled 2012-04-21 (×10): qty 1

## 2012-04-21 NOTE — Progress Notes (Signed)
PT Cancellation Note  Patient Details Name: Michelle Hodges MRN: [id number] DOB: Sep 27, 1945   Cancelled Treatment:     Pt d/cing to CIR today.      Verdell Face, [place] [phone] 04/21/2012

## 2012-04-21 NOTE — Progress Notes (Signed)
     [facility] Daily Rounding Note 04/21/2012, 11:58 AM  SUBJECTIVE:       No complaints.  Tolerating solids.   OBJECTIVE:         Vital signs in last 24 hours:    Temp:  [97.6 F (36.4 C)-98.5 F (36.9 C)] 98.5 F (36.9 C) (03/19 0600) Pulse Rate:  [51-58] 58 (03/19 0600) Resp:  [18] 18 (03/19 0600) BP: (129-136)/(75-83) 136/75 mmHg (03/19 0600) SpO2:  [96 %-98 %] 96 % (03/19 0600) Last BM Date: 04/20/12 General: looks well   Heart: RRR Chest: clear Abdomen: slight distention, active BS, NT.  Extremities: no CCE Neuro/Psych:  Oriented x3, relaxed.   Intake/Output from previous day: 03/18 0701 - 03/19 0700 In: 350 [I.V.:350] Out: 2 [Stool:2]   ASSESMENT: * > One year of bloating and LLQ pain. 30 # weight loss, alternating bowel habits. CT scan with ? of compression of redundant sigmoid by adjacent loops of redundant bowel. Attempted, but incomplete colonoscopy 3/18 found redundant left colon, poor prep but no mass or volvulus. Contrast enema study of colon completed this AM but not yet read. .   * Right sided weakness, dizziness, behavioral changes. LP to rule out MS done on 3/17, awaiting firm diagnoses from neuro.  Treated with Soumedrol 3/14 - 3/18.     PLAN: *  Await reading of completed contrast enema study of colon.    Addendum:  The contrast study shows "marked redundancy of the colon, particularly the sigmoid portion. 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   Dr Lauro Franklin rec is for daily laxative to promote motility and prevent constipation.  She may very well exhibit diarrhea as she may not control her bowels well, so timing of the laxative needs to scheduled in order to avoid public fecal incontinence if this becomes an issue.   For marked colonic distention, prn insertion of short rectal tube, "red rubber catheter", to allow for gas to escape might be useful.    The findings and plans were d/w the pt and her daughter. Pt headed to rehab later today.     LOS: 6 days   Jennye Moccasin  04/21/2012, 11:58 AM Pager: [phone]

## 2012-04-21 NOTE — Progress Notes (Signed)
I agree with the above documentation, including the assessment and plan. 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 Agree with rectal tube each bedtime when necessary for been taking his abdominal distention continues to be problematic Call with questions

## 2012-04-21 NOTE — Progress Notes (Signed)
Pt admitted to unit at 1750 with daughter at bedside. Rehab booklet given and reviewed with pt and daughter with verbal understanding. Safety sheet reviewed and signed. Call bell within reach, bed alarm on, and pt resting.

## 2012-04-21 NOTE — Progress Notes (Addendum)
[facility] PROGRESS NOTE Interim History: 67 year old female who presents to the ER via EMS due  behavior/weakness.  She's never had a stroke. In addition patient has been dropping things more than usual with hands, also right-sided. Today at work patient was noted to have an episode of disequilibrium and facial droop which has since resolved. Patient has never been evaluated by a cardiologist or a neurologist and daughter reports that they have made her appointments however she prefers herbal medicines and is non-compliant with appointments. She's also lost 30 pounds and has never had a colonoscopy. She feels that her abdomen is bloated, she has had diarrhea alternating with constipation. Patient denies any current change in vision, dizziness, chest pain, shortness of breath, dyspnea on exertion, decreased coordination.    Assessment/Plan: Demyelinating disease of central nervous system/  Multiple sclerosis: - thought to be due to MS, LP results pending. - follow up with Neurology as an outpatient. - CIR  Abdominal distention/ Loss of weight: - Colonoscopy 3.19.2014 no evidence of mass or volvolus - barium enema result pending 3.19.2014. - [facility].   Code Status: full Family Communication: bedside  Disposition Plan: CIR   Consultants:  GI  Neurology  Procedures:  Colonoscopy 3.19.2014  LP 3.17.2014: labs pending  Antibiotics:  None  HPI/Subjective: No complains  Objective: Filed Vitals:   04/20/12 1010 04/20/12 1325 04/20/12 2200 04/21/12 0600  BP: 137/83 129/83 132/75 136/75  Pulse:  53 51 58  Temp:  97.9 F (36.6 C) 97.6 F (36.4 C) 98.5 F (36.9 C)  TempSrc:  Oral Oral Oral  Resp: 15 18 18 18   Height:      Weight:      SpO2: 94% 98% 98% 96%    Intake/Output Summary (Last 24 hours) at 04/21/12 0741 Last data filed at 04/21/12 0600  Gross per 24 hour  Intake    350 ml  Output      2 ml  Net    348 ml   Filed Weights   04/15/12 1244   Weight: 56.7 kg (125 lb)    Exam:  General: Alert, awake, oriented x3.  HEENT: No bruits, no goiter.  Heart: Regular rate and rhythm, without murmurs, rubs, gallops.  Lungs: Clear to auscultation bilaterally.  Abdomen: distended, +BS.  Extremities: No clubbing cyanosis or edema with positive pedal pulses.    Data Reviewed: Basic Metabolic Panel:  Recent Labs Lab 04/15/12 1345  NA 142  K 4.4  CL 108  CO2 26  GLUCOSE 83  BUN 16  CREATININE 0.80  CALCIUM 9.3   Liver Function Tests:  Recent Labs Lab 04/15/12 1345  AST 27  ALT 25  ALKPHOS 46  BILITOT 0.4  PROT 6.2  ALBUMIN 3.4*   No results found for this basename: LIPASE, AMYLASE,  in the last 168 hours No results found for this basename: AMMONIA,  in the last 168 hours CBC:  Recent Labs Lab 04/15/12 1345  WBC 8.0  NEUTROABS 4.7  HGB 10.9*  HCT 32.9*  MCV 92.7  PLT 181   Cardiac Enzymes:  Recent Labs Lab 04/15/12 1346  TROPONINI <0.30   BNP (last 3 results) No results found for this basename: PROBNP,  in the last 8760 hours CBG:  Recent Labs Lab 04/19/12 1647 04/19/12 2159 04/20/12 0642 04/20/12 1206 04/20/12 1638  GLUCAP 102* 125* 133* 123* 101*    Recent Results (from the past 240 hour(s))  GRAM STAIN     Status: None  Collection Time    04/19/12 10:20 AM      Result Value Range Status   Specimen Description CSF   Final   Special Requests 2.3ML FLUID   Final   Gram Stain     Final   Value: CYTOSPIN SLIDE:     SQUAMOUS EPITHELIAL CELLS PRESENT     NO WBC SEEN     NO ORGANISMS SEEN   Report Status 04/19/2012 FINAL   Final  CSF CULTURE     Status: None   Collection Time    04/19/12 10:20 AM      Result Value Range Status   Specimen Description CSF   Final   Special Requests 2.3ML FLUID   Final   Gram Stain     Final   Value: NO WBC SEEN     NO ORGANISMS SEEN     Performed at [facility]   Culture NO GROWTH 1 DAY   Final   Report Status PENDING    Incomplete  FUNGUS CULTURE W SMEAR     Status: None   Collection Time    04/19/12 10:20 AM      Result Value Range Status   Specimen Description CSF   Final   Special Requests 2.3ML FL   Final   Fungal Smear NO YEAST OR FUNGAL ELEMENTS SEEN   Final   Culture CULTURE IN PROGRESS FOR FOUR WEEKS   Final   Report Status PENDING   Incomplete     Studies: No results found.  Scheduled Meds: . aspirin  325 mg Oral Daily  . docusate sodium  100 mg Oral BID  . polyethylene glycol  17 g Oral Daily   Continuous Infusions: . sodium chloride 75 mL/hr at 04/17/12 1027  . sodium chloride 500 mL (04/20/12 0827)    Marinda Elk  [facility] Pager [phone]. If 8PM-8AM, please contact night-coverage at [email or web address], password [facility] 04/21/2012, 7:41 AM  LOS: 6 days

## 2012-04-21 NOTE — Progress Notes (Signed)
At  0730 patient off unit down for endo.  Finnian Husted RN

## 2012-04-21 NOTE — Progress Notes (Signed)
Admitting patient to inpatient rehab today. Patient and daughter are in agreement with plan. For questions, call [phone]

## 2012-04-21 NOTE — Progress Notes (Signed)
Report called to rehab, patient will transfer after finishing her dinner.  Assessment unchanged.
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
## 2012-04-21 NOTE — Interval H&P Note (Signed)
Michelle Hodges was admitted today to Inpatient Rehabilitation with the diagnosis of demyelinating disease/?MS.  The patient's history has been reviewed, patient examined, and there is no change in status.  Patient continues to be appropriate for intensive inpatient rehabilitation.  I have reviewed the patient's chart and labs.  Questions were answered to the patient's satisfaction.  Glennie Bose T 04/21/2012, 8:37 PM

## 2012-04-21 NOTE — Plan of Care (Addendum)
Overall Plan of Care [facility]) Patient Details Name: Michelle Hodges MRN: [id number] DOB: [birth year]  Diagnosis:  MS/demyelinating disorder  Co-morbidities: remote infarcts, gait disorder  Functional Problem List  Patient demonstrates impairments in the following areas: Balance, Bladder, Bowel, Cognition, Pain, Safety and Sensory   Basic ADL's: grooming, bathing, dressing and toileting Advanced ADL's: simple meal preparation  Transfers:  bed mobility, bed to chair, toilet, tub/shower, car and furniture Locomotion:  ambulation, wheelchair mobility and stairs  Additional Impairments:  Social Cognition   problem solving, memory and awareness  Anticipated Outcomes Item Anticipated Outcome  Eating/Swallowing    Basic self-care  Supervision  Tolieting  Supervision  Bowel/Bladder  continent of bowel and bladder  Transfers  Supervision  Locomotion  supervision  Communication    Cognition  Supervision  Pain  3 or less  Safety/Judgment  Supervision  Other     Therapy Plan: PT Intensity: Minimum of 1-2 x/day ,45 to 90 minutes PT Frequency: 5 out of 7 days PT Duration Estimated Length of Stay: 7-10 days OT Intensity: Minimum of 1-2 x/day, 45 to 90 minutes OT Frequency: 5 out of 7 days OT Duration/Estimated Length of Stay: 7-10 days SLP Intensity: Minumum of 1-2 x/day, 30 to 90 minutes SLP Frequency: 5 out of 7 days SLP Duration/Estimated Length of Stay: 7-10 days    Team Interventions: Item RN PT OT SLP SW TR Other  Self Care/Advanced ADL Retraining   x      Neuromuscular Re-Education  x x      Therapeutic Activities  x x x  x   UE/LE Strength Training/ROM  x x      UE/LE Coordination Activities  x x      Visual/Perceptual Remediation/Compensation         DME/Adaptive Equipment Instruction  x x   x   Therapeutic Exercise  x x   x   Balance/Vestibular Training  x x   x   Patient/Family Education x x x x  x   Cognitive Remediation/Compensation  x x x  x    Functional Mobility Training  x x   x   Ambulation/Gait Training  x x   x   Stair Training  x       Wheelchair Propulsion/Positioning  x x      Functional Tourist information centre manager Reintegration  x x   x   Dysphagia/Aspiration Film/video editor         Bladder Management x        Bowel Management x        Disease Management/Prevention x x x      Pain Management x  x      Medication Management x        Skin Care/Wound Management  x x      Splinting/Orthotics   x      Discharge Planning  x x x  x   Psychosocial Support  x x x  x                          Team Discharge Planning: Destination: PT-Home ,OT- Home (Daughter, Michelle Hodges, home) , SLP-Home Projected Follow-up: PT-Home health PT;24 hour supervision/assistance, OT-  None, SLP-24 hour supervision/assistance;Home Health SLP Projected Equipment Needs: PT-Rolling walker with 5" wheels;Wheelchair (measurements);Wheelchair cushion (measurements), OT- 3 in 1 bedside comode;Tub/shower bench, SLP-None recommended  by SLP Patient/family involved in discharge planning: PT- Patient;Family member/caregiver,  OT-Patient;Family member/caregiver, SLP-Patient;Family member/caregiver  MD ELOS: 7-10 days Medical Rehab Prognosis:  Excellent Assessment: The patient has been admitted for CIR therapies. The team will be addressing, functional mobility, strength, stamina, balance, safety, adaptive techniques/equipment, self-care, bowel and bladder mgt, patient and caregiver education, NMR, pain mgt, coping strategies. Goals have been set at supervision..      See Team Conference Notes for weekly updates to the plan of care

## 2012-04-22 ENCOUNTER — Inpatient Hospital Stay (HOSPITAL_COMMUNITY): Payer: Medicare Other | Admitting: Occupational Therapy

## 2012-04-22 ENCOUNTER — Inpatient Hospital Stay (HOSPITAL_COMMUNITY): Payer: Medicare Other | Admitting: Speech Pathology

## 2012-04-22 ENCOUNTER — Inpatient Hospital Stay (HOSPITAL_COMMUNITY): Payer: Medicare Other | Admitting: Physical Therapy

## 2012-04-22 DIAGNOSIS — G35 Multiple sclerosis: Secondary | ICD-10-CM

## 2012-04-22 LAB — CBC WITH DIFFERENTIAL/PLATELET
Basophils Relative: 0 % (ref 0–1)
Eosinophils Absolute: 0.2 10*3/uL (ref 0.0–0.7)
Eosinophils Relative: 2 % (ref 0–5)
HCT: 34.3 % — ABNORMAL LOW (ref 36.0–46.0)
Hemoglobin: 11.6 g/dL — ABNORMAL LOW (ref 12.0–15.0)
MCH: 30.5 pg (ref 26.0–34.0)
MCHC: 33.8 g/dL (ref 30.0–36.0)
MCV: 90.3 fL (ref 78.0–100.0)
Monocytes Absolute: 0.5 10*3/uL (ref 0.1–1.0)
Monocytes Relative: 6 % (ref 3–12)

## 2012-04-22 LAB — CSF CULTURE W GRAM STAIN: Culture: NO GROWTH

## 2012-04-22 LAB — COMPREHENSIVE METABOLIC PANEL
Albumin: 2.9 g/dL — ABNORMAL LOW (ref 3.5–5.2)
BUN: 10 mg/dL (ref 6–23)
Creatinine, Ser: 0.9 mg/dL (ref 0.50–1.10)
GFR calc Af Amer: 76 mL/min — ABNORMAL LOW (ref >90)
Total Bilirubin: 0.7 mg/dL (ref 0.3–1.2)
Total Protein: 5.8 g/dL — ABNORMAL LOW (ref 6.0–8.3)

## 2012-04-22 NOTE — Progress Notes (Signed)
Subjective/Complaints: Slept well, denies pain. Excited to start therapy A 12 point review of systems has been performed and if not noted above is otherwise negative.   Objective: Vital Signs: Blood pressure 145/90, pulse 55, temperature 98.7 F (37.1 C), temperature source Oral, resp. rate 20, weight 62.6 kg (138 lb 0.1 oz), SpO2 95.00%. Dg Colon W/cm - Wo/w Kub  04/21/2012  *RADIOLOGY REPORT*  Clinical Data: Chronic colonic distention.  Incomplete colonoscopy.  SINGLE COLUMN BARIUM ENEMA  Technique:  Initial scout AP supine abdominal image obtained to insure adequate colon cleansing.  Barium was introduced into the colon in a retrograde fashion and refluxed from the rectum to the cecum. Spot images of the colon followed by overhead radiographs were obtained.  Fluoroscopy time: 3 minutes 17 seconds.  Comparison:  CT scan dated 04/17/2012  Findings:  The entire colon was filled in the normal retrograde manner.  The colon is quite redundant. There is no evidence of cecal volvulus or persistent narrowing.  There are no mass lesions, polyps, diverticula, or other significant abnormalities.  There is a large amount of retained thin barium and air in the colon although the patient was able to evacuate a moderate amount of the barium.  IMPRESSION: Marked redundancy of the colon, particularly the sigmoid portion. However, there is no cecal volvulus or other significant abnormality.   Original Report Authenticated By: Francene Boyers, M.D.     Recent Labs  04/22/12 0643  WBC 7.4  HGB 11.6*  HCT 34.3*  PLT 200    Recent Labs  04/22/12 0643  NA 145  K 3.9  CL 106  GLUCOSE 88  BUN 10  CREATININE 0.90  CALCIUM 9.0   CBG (last 3)   Recent Labs  04/21/12 0644 04/21/12 1152 04/21/12 1644  GLUCAP 82 70 102*    Wt Readings from Last 3 Encounters:  04/21/12 62.6 kg (138 lb 0.1 oz)  04/15/12 56.7 kg (125 lb)  04/15/12 56.7 kg (125 lb)    Physical Exam:  Constitutional: She is oriented to  person, place, and time.  HENT: dentition fair, mucosa pink and moist  Head: Normocephalic.  Eyes: EOM are normal.  Neck: Neck supple. No thyromegaly present.  Cardiovascular: Normal rate and regular rhythm. No M,R,G  Pulmonary/Chest: Effort normal and breath sounds normal. No respiratory distress.  Abdominal: Bowel sounds are normal. She exhibits no distension.  Musculoskeletal: She exhibits no edema.  Neurological: She is alert and oriented to person, place, and time.  Patient followed basic commands. She did need some cues for medical history. Very pleasant but impulsive, sometimes hard to keep on task. Sensation 1 to 1+ right leg, arm, face. Mild sensory loss left leg. Increased tone in both quads, had difficulty breaking tone with PROM (MAS 2-3). No gross visual abnl. Decreased FMC of right arm and leg on exam.  Skin: Skin is warm and dry   Assessment/Plan: 1. 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 Physiatrist is providing close team supervision and 24 hour management of active medical problems listed below. Physiatrist and rehab team continue to assess barriers to discharge/monitor patient progress toward functional and medical goals. FIM:                                 Medical Problem List and Plan:  1. Demyelinating disorder/MS. LP with increased protein, await  other results 2. DVT Prophylaxis/Anticoagulation: SCDs. Monitor for any signs of DVT  3. Neuropsych: This patient is capable of making decisions on his/her own behalf.  4. Remote lateral cortical and subcortical infarcts. Continue aspirin therapy 5. Tone- ROM, positioning for now. Utilize antispasmodics if needed   LOS (Days) 1 A FACE TO FACE EVALUATION WAS PERFORMED  Severo Beber T 04/22/2012 8:17 AM

## 2012-04-22 NOTE — Progress Notes (Signed)
Patient information reviewed and entered into eRehab system by Sidi Dzikowski, RN, CRRN, PPS Coordinator.  Information including medical coding and functional independence measure will be reviewed and updated through discharge.     Per nursing patient was given "Data Collection Information Summary for Patients in Inpatient Rehabilitation Facilities with attached "Privacy Act Statement-Health Care Records" upon admission.  
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
## 2012-04-22 NOTE — Evaluation (Signed)
Speech Language Pathology Assessment and Plan  Patient Details  Name: Michelle Hodges MRN: [id number] Date of Birth: 1946/01/24  SLP Diagnosis: Cognitive Impairments  Rehab Potential: Excellent ELOS: 7-10 days   Today's Date: 04/22/2012 Time: 4098-1191 Time Calculation (min): 60 min  Skilled Therapeutic Intervention: Administered BSE and cognitive-linguistic evaluation. Please see below for details.   Problem List:  Patient Active Problem List  Diagnosis  . HYPERCHOLESTEROLEMIA  . PERIPHERAL VASCULAR DISEASE  . INSOMNIA, HX OF  . Demyelinating disease of central nervous system  . History of CVA (cerebrovascular accident)  . Weakness of right side of body  . Multiple sclerosis  . Abdominal distention  . Loss of weight  . Dilatation of colon  . MS (multiple sclerosis)   Past Medical History:  Past Medical History  Diagnosis Date  . History of blood transfusion    Past Surgical History:  Past Surgical History  Procedure Laterality Date  . Tonsillectomy    . Colonoscopy N/A 04/20/2012    Procedure: COLONOSCOPY;  Surgeon: Beverley Fiedler, MD;  Location: [facility] ENDOSCOPY;  Service: Gastroenterology;  Laterality: N/A;    Assessment / Plan / Recommendation Clinical Impression  Pt is a 67 y.o. right-handed female admitted 04/15/2012 with altered mental status and right lower extremity weakness that originally had onset acutely 2 years ago but was never evaluated. The family also reports weight loss of 30 pounds. MRI of the brain with subacute infarct noted remote bilateral cortical and subcortical infarcts as well as diffuse white matter disease compatible with chronic microvascular ischemia. MRA of the head with no stenosis or occlusion. Echocardiogram with ejection fraction of 60% no wall motion abnormalities. Carotid Dopplers with no ICA stenosis. Neurology services consulted questionable multiple sclerosis placed on intravenous Solu-Medrol x3 days 04/16/2012 and completed 04/20/2012.  Patient initially refused lumbar puncture later agreed and awaiting findings. Consult to gastroenterology( Dr. Rhea Belton) in reference to history of weight loss as well as complaints of left lower quadrant pain and bloating with intermittent diarrhea. CT scan of the abdomen completed question sigmoid volvulus and plan colonoscopy was completed 04/20/2012 showing no evidence of left colon mass or volvulus and retroflexion not performed due to presence of stool. A barium contrast enema completed with follow-up completion of colonoscopy again being unremarkable 04/21/2012. Physical therapy evaluation completed 04/16/2012 with recommendations of physical medicine rehabilitation consult to consider inpatient rehabilitation services. Patient was felt to be a good candidate for inpatient rehabilitation services was admitted for comprehensive rehabilitation program. Pt was admitted to [facility] 04/21/12 and presents with moderate cognitive impairments characterized by impaired problem solving, organization, working memory, Engineer, building services and delayed processing. Pt also administered BSE. Pt did not demonstrate any overt s/s of aspiration and was recommended to continue current diet of regular textures with thin liquids. Pt would benefit from skilled SLP intervention to maximize cognitive function and overall functional independence.     SLP Assessment  Patient will need skilled Speech Lanaguage Pathology Services during CIR admission    Recommendations  Diet Recommendations: Thin liquid;Regular Liquid Administration via: Cup;Straw Medication Administration: Whole meds with liquid (or puree, per preference) Supervision: Patient able to self feed Compensations: Slow rate;Small sips/bites Postural Changes and/or Swallow Maneuvers: Seated upright 90 degrees Oral Care Recommendations: Oral care BID Patient destination: Home Follow up Recommendations: 24 hour supervision/assistance;Home Health SLP Equipment Recommended:  None recommended by SLP    SLP Frequency 5 out of 7 days   SLP Treatment/Interventions Cueing hierarchy;Cognitive remediation/compensation;Environmental controls;Functional tasks;Internal/external aids;Patient/family education;Therapeutic Activities  Pain No/Denies Pain   Short Term Goals: Week 1: SLP Short Term Goal 1 (Week 1): Pt will utilize external memory aids to increase recall/carryover of newly learned information with Min A verbal and visual cues.  SLP Short Term Goal 2 (Week 1): Pt will demonstrate functional problem solving with basic and familiar task with supervision verbal cues.  SLP Short Term Goal 3 (Week 1): Pt will decrease tangents during functional conversation with Min question cues.  SLP Short Term Goal 4 (Week 1): Pt will demonstrate efficient mastication of regular textures with Mod I.   See FIM for current functional status Refer to Care Plan for Long Term Goals  Recommendations for other services: Neuropsych  Discharge Criteria: Patient will be discharged from SLP if patient refuses treatment 3 consecutive times without medical reason, if treatment goals not met, if there is a change in medical status, if patient makes no progress towards goals or if patient is discharged from hospital.  The above assessment, treatment plan, treatment alternatives and goals were discussed and mutually agreed upon: by patient and by family  Irean Kendricks 04/22/2012, 3:24 PM

## 2012-04-22 NOTE — Care Management Note (Signed)
    Page 1 of 1   04/22/2012     8:53:09 AM   CARE MANAGEMENT NOTE 04/22/2012  Patient:  Michelle Hodges, Michelle Hodges   Account Number:  [id number]  Date Initiated:  04/19/2012  Documentation initiated by:  [facility]  Subjective/Objective Assessment:   admitted with rt sided weakness     Action/Plan:   PT/OT evals-recommending inpatent rehab  consult to inpatient rehab   Anticipated DC Date:  04/21/2012   Anticipated DC Plan:  IP REHAB FACILITY      DC Planning Services  CM consult      Choice offered to / List presented to:             Status of service:  Completed, signed off Medicare Important Message given?   (If response is "NO", the following Medicare IM given date fields will be blank) Date Medicare IM given:   Date Additional Medicare IM given:    Discharge Disposition:  IP REHAB FACILITY  Per UR Regulation:  Reviewed for med. necessity/level of care/duration of stay  If discussed at Long Length of Stay Meetings, dates discussed:    Comments:

## 2012-04-23 ENCOUNTER — Inpatient Hospital Stay (HOSPITAL_COMMUNITY): Payer: Medicare Other

## 2012-04-23 ENCOUNTER — Inpatient Hospital Stay (HOSPITAL_COMMUNITY): Payer: Medicare Other | Admitting: Physical Therapy

## 2012-04-23 ENCOUNTER — Inpatient Hospital Stay (HOSPITAL_COMMUNITY): Payer: Medicare Other | Admitting: Speech Pathology

## 2012-04-23 DIAGNOSIS — G35 Multiple sclerosis: Secondary | ICD-10-CM

## 2012-04-23 NOTE — Progress Notes (Signed)
Subjective/Complaints: No major issues. Working well with therapies. A 12 point review of systems has been performed and if not noted above is otherwise negative.   Objective: Vital Signs: Blood pressure 133/78, pulse 62, temperature 98.4 F (36.9 C), temperature source Oral, resp. rate 18, weight 62.6 kg (138 lb 0.1 oz), SpO2 98.00%. Dg Colon W/cm - Wo/w Kub  04/21/2012  *RADIOLOGY REPORT*  Clinical Data: Chronic colonic distention.  Incomplete colonoscopy.  SINGLE COLUMN BARIUM ENEMA  Technique:  Initial scout AP supine abdominal image obtained to insure adequate colon cleansing.  Barium was introduced into the colon in a retrograde fashion and refluxed from the rectum to the cecum. Spot images of the colon followed by overhead radiographs were obtained.  Fluoroscopy time: 3 minutes 17 seconds.  Comparison:  CT scan dated 04/17/2012  Findings:  The entire colon was filled in the normal retrograde manner.  The colon is quite redundant. There is no evidence of cecal volvulus or persistent narrowing.  There are no mass lesions, polyps, diverticula, or other significant abnormalities.  There is a large amount of retained thin barium and air in the colon although the patient was able to evacuate a moderate amount of the barium.  IMPRESSION: Marked redundancy of the colon, particularly the sigmoid portion. However, there is no cecal volvulus or other significant abnormality.   Original Report Authenticated By: Francene Boyers, M.D.     Recent Labs  04/22/12 0643  WBC 7.4  HGB 11.6*  HCT 34.3*  PLT 200    Recent Labs  04/22/12 0643  NA 145  K 3.9  CL 106  GLUCOSE 88  BUN 10  CREATININE 0.90  CALCIUM 9.0   CBG (last 3)   Recent Labs  04/21/12 0644 04/21/12 1152 04/21/12 1644  GLUCAP 82 70 102*    Wt Readings from Last 3 Encounters:  04/21/12 62.6 kg (138 lb 0.1 oz)  04/15/12 56.7 kg (125 lb)  04/15/12 56.7 kg (125 lb)    Physical Exam:  Constitutional: She is oriented to  person, place, and time.  HENT: dentition fair, mucosa pink and moist  Head: Normocephalic.  Eyes: EOM are normal.  Neck: Neck supple. No thyromegaly present.  Cardiovascular: Normal rate and regular rhythm. No M,R,G  Pulmonary/Chest: Effort normal and breath sounds normal. No respiratory distress.  Abdominal: Bowel sounds are normal. She exhibits no distension.  Musculoskeletal: She exhibits no edema.  Neurological: She is alert and oriented to person, place, and time.  Patient followed basic commands. She did need some cues for medical history. Very pleasant still sometimes hard to keep on task. Sensation 1 to 1+ right leg, arm, face. Mild sensory loss left leg. Tone present at knees. No gross visual abnl. Decreased FMC of right arm and leg on exam.  Skin: Skin is warm and dry   Assessment/Plan: 1. 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 Physiatrist is providing close team supervision and 24 hour management of active medical problems listed below. Physiatrist and rehab team continue to assess barriers to discharge/monitor patient progress toward functional and medical goals. FIM: FIM - Bathing Bathing Steps Patient Completed: Chest;Right Arm;Left Arm;Abdomen;Front perineal area;Buttocks;Left upper leg;Right upper leg Bathing: 4: Min-Patient completes 8-9 13f 10 parts or 75+ percent  FIM - Upper Body Dressing/Undressing Upper body dressing/undressing steps patient completed: Thread/unthread right sleeve of pullover shirt/dresss;Put head through opening of pull over shirt/dress;Thread/unthread left sleeve of pullover shirt/dress;Pull  shirt over trunk Upper body dressing/undressing: 4: Steadying assist FIM - Lower Body Dressing/Undressing Lower body dressing/undressing steps patient completed: Thread/unthread right underwear leg;Thread/unthread left underwear leg;Pull  underwear up/down;Thread/unthread right pants leg;Thread/unthread left pants leg;Pull pants up/down;Fasten/unfasten pants;Don/Doff left shoe;Don/Doff right shoe;Fasten/unfasten right shoe;Fasten/unfasten left shoe Lower body dressing/undressing: 4: Min-Patient completed 75 plus % of tasks  FIM - Toileting Toileting steps completed by patient: Adjust clothing prior to toileting;Performs perineal hygiene;Adjust clothing after toileting Toileting: 4: Steadying assist  FIM - Diplomatic Services operational officer Devices: Elevated toilet seat;Walker;Grab bars Toilet Transfers: 4-To toilet/BSC: Min A (steadying Pt. > 75%);4-From toilet/BSC: Min A (steadying Pt. > 75%)  FIM - Bed/Chair Transfer Bed/Chair Transfer: 5: Supine > Sit: Supervision (verbal cues/safety issues);4: Sit > Supine: Min A (steadying pt. > 75%/lift 1 leg);4: Bed > Chair or W/C: Min A (steadying Pt. > 75%);4: Chair or W/C > Bed: Min A (steadying Pt. > 75%)  FIM - Locomotion: Wheelchair Distance: 100 Locomotion: Wheelchair: 2: Travels 50 - 149 ft with supervision, cueing or coaxing FIM - Locomotion: Ambulation Ambulation/Gait Assistance: 3: Mod assist Locomotion: Ambulation: 1: Travels less than 50 ft with moderate assistance (Pt: 50 - 74%)  Comprehension Comprehension Mode: Auditory Comprehension: 6-Follows complex conversation/direction: With extra time/assistive device  Expression Expression Mode: Verbal Expression: 6-Expresses complex ideas: With extra time/assistive device  Social Interaction Social Interaction: 6-Interacts appropriately with others with medication or extra time (anti-anxiety, antidepressant).  Problem Solving Problem Solving: 4-Solves basic 75 - 89% of the time/requires cueing 10 - 24% of the time  Memory Memory: 3-Recognizes or recalls 50 - 74% of the time/requires cueing 25 - 49% of the time Medical Problem List and Plan:  1. Demyelinating disorder/MS. LP with increased protein. Other  results still pending. 2. DVT Prophylaxis/Anticoagulation: SCDs. Monitor for any signs of DVT  3. Neuropsych: This patient is capable of making decisions on his/her own behalf.  4. Remote lateral cortical and subcortical infarcts. Continue aspirin therapy 5. Tone- ROM, positioning for now. Utilize antispasmodics if needed   LOS (Days) 2 A FACE TO FACE EVALUATION WAS PERFORMED  Eldon Zietlow T 04/23/2012 9:04 AM

## 2012-04-23 NOTE — Progress Notes (Signed)
Speech Language Pathology Daily Session Note  Patient Details  Name: Michelle Hodges MRN: [id number] Date of Birth: [birth year]  Today's Date: 04/23/2012 Time: 1000-1100 Time Calculation (min): 60 min  Short Term Goals: Week 1: SLP Short Term Goal 1 (Week 1): Pt will utilize external memory aids to increase recall/carryover of newly learned information with Min A verbal and visual cues.  SLP Short Term Goal 2 (Week 1): Pt will demonstrate functional problem solving with basic and familiar task with supervision verbal cues.  SLP Short Term Goal 3 (Week 1): Pt will decrease tangents during functional conversation with Min question cues.  SLP Short Term Goal 4 (Week 1): Pt will demonstrate efficient mastication of regular textures with Mod I.   Skilled Therapeutic Interventions: Treatment focus on cognitive goals. SLP facilitated by providing supervision question cues for 4 & 6 step sequencing task. Pt participated in a new learning task and required Min verbal cues for recall of rules to game and for functional problem solving and organization throughout the task. Pt demonstrated increased emergent awareness of deficits throughout the session and utilized extra time to complete tasks.    FIM:  Comprehension Comprehension Mode: Auditory Comprehension: 6-Follows complex conversation/direction: With extra time/assistive device Expression Expression Mode: Verbal Expression: 5-Expresses basic needs/ideas: With extra time/assistive device Social Interaction Social Interaction: 6-Interacts appropriately with others with medication or extra time (anti-anxiety, antidepressant). Problem Solving Problem Solving: 4-Solves basic 75 - 89% of the time/requires cueing 10 - 24% of the time Memory Memory: 3-Recognizes or recalls 50 - 74% of the time/requires cueing 25 - 49% of the time FIM - Eating Eating Activity: 7: Complete independence:no helper  Pain Pain Assessment Pain Assessment: No/denies  pain   Therapy/Group: Individual Therapy  Verble Styron 04/23/2012, 11:24 AM

## 2012-04-23 NOTE — Progress Notes (Signed)
Physical Therapy Session Note  Patient Details  Name: Michelle Hodges MRN: [id number] Date of Birth: 1946/01/22  Today's Date: 04/23/2012 Time: 4098-1191 Time Calculation (min): 46 min  Short Term Goals: Week 1:  PT Short Term Goal 1 (Week 1): = long term goals  Skilled Therapeutic Interventions/Progress Updates:    Pt reports continued upper back and neck musculature discomfort. RN providing medication, PT added back support to wheelchair and also suggested pt spend more time in the recliner for more support.   Pt ambulated from room >250' with RW and min assist particularly with turns (pt tends to cross over feet/ attain very narrow base of support) which cause pt to have balance loss laterally. Practiced car transfer to SUV height with min-guard assist and sequencing cues. Discussed safety with pt and daughter. Pt ascended/descended 4 steps sideways with rail on Rt and with PT providing min assist, steps at 6" height. Repeated these 4 steps with daughter providing min assist. Discussed and simulated daughter moving RW up/down steps prior to pt doing steps at home.   Pt will have to cross over uneven terrain to reach steps, will need to practice with RW.    Therapy Documentation Precautions:  Precautions Precautions: Fall Restrictions Weight Bearing Restrictions: No Pain: Pain Assessment Pain Assessment: 0-10 Pain Score:   4 Pain Type: Acute pain Pain Location: Neck Pain Orientation: Posterior Pain Descriptors: Aching Pain Frequency: Intermittent Pain Onset: Gradual Patients Stated Pain Goal: 2 Pain Intervention(s): Medication (RN providing pt with medication) Multiple Pain Sites: No  See FIM for current functional status  Therapy/Group: Individual Therapy  Wilhemina Bonito 04/23/2012, 3:35 PM

## 2012-04-23 NOTE — Progress Notes (Signed)
[facility] Individual Statement of Services  Patient Name:  Michelle Hodges  Date:  04/23/2012  Welcome to the [facility].  Our goal is to provide you with an individualized program based on your diagnosis and situation, designed to meet your specific needs.  With this comprehensive rehabilitation program, you will be expected to participate in at least 3 hours of rehabilitation therapies Monday-Friday, with modified therapy programming on the weekends.  Your rehabilitation program will include the following services:  Physical Therapy (PT), Occupational Therapy (OT), Speech Therapy (ST), 24 hour per day rehabilitation nursing, Therapeutic Recreaction (TR), Neuropsychology, Case Management (Social Worker), Rehabilitation Medicine, Nutrition Services and Pharmacy Services  Weekly team conferences will be held on Tuesdays to discuss your progress.  Your  Social Worker will talk with you frequently to get your input and to update you on team discussions.  Team conferences with you and your family in attendance may also be held.  Expected length of stay: 10-14 days  Overall anticipated outcome: supervision to minimal                                                                                                                          assistance  Depending on your progress and recovery, your program may change.  Your  Social Worker will coordinate services and will keep you informed of any changes.  Your  Social Worker's name and contact numbers are listed  below.  The following services may also be recommended but are not provided by the [facility]:   Driving Evaluations  Home Health Rehabiltiation Services  Outpatient Rehabilitatation [facility]  Vocational Rehabilitation   Arrangements will be made to provide these services after discharge if needed.  Arrangements include referral to agencies that provide these services.  Your  insurance has been verified to be:  Medicare and Medicaid Your primary doctor is:  None (SW to assist with establishing new MD)  Pertinent information will be shared with your doctor and your insurance company.  Social Worker:  [place], [place] [phone] or ([phone]  Information discussed with and copy given to patient by: Amada Jupiter, 04/23/2012, 2:23 PM

## 2012-04-23 NOTE — Progress Notes (Signed)
Occupational Therapy Session Note  Patient Details  Name: LEAIRA FULLAM MRN: [id number] Date of Birth: 03-Jul-1945  Today's Date: 04/23/2012 Time: 0800-0900 Time Calculation (min): 60 min  Short Term Goals: Week 1:  OT Short Term Goal 1 (Week 1): Short Term Goals = Long Term Goals secondary to ELOS  Skilled Therapeutic Interventions/Progress Updates:    Pt seated in recliner with daughter present.  Pt agreeable to engaging in bathing and dressing tasks.  Pt declined shower this morning and elected to complete tasks at sink.  Pt amb with RW (min A) to w/c at sink.  Pt completed all tasks with steady A only when standing and for sit<>stand.  Pt required min verbal cues for safety awareness and sequencing.  Pt transitioned to ADL apartment to practice tub transfers by stepping over edge of tub.  Pt required min A to perform task.  Focus on activity tolerance, safety awareness, functional ambulation with RW, family education, and dynamic standing balance.  Therapy Documentation Precautions:  Precautions Precautions: Fall Restrictions Weight Bearing Restrictions: No   Pain: Pain Assessment Pain Assessment: No/denies pain  See FIM for current functional status  Therapy/Group: Individual Therapy  Rich Brave 04/23/2012, 9:04 AM

## 2012-04-23 NOTE — Progress Notes (Signed)
Physical Therapy Session Note  Patient Details  Name: Michelle Hodges MRN: [id number] Date of Birth: 1945/04/03  Today's Date: 04/23/2012 Time: 0900-0945 Time Calculation (min): 45 min  Short Term Goals: Week 1:  PT Short Term Goal 1 (Week 1): = long term goals  Skilled Therapeutic Interventions/Progress Updates:    Pt and daughter practiced ambulation to bathroom with RW, PT gave suggestions for daughter's positioning and safety. PT signed off daughter to take pt to bathroom without rehab assistance and made nursing aware. Pt engaged in wheelchair mobility for problem solving around room environment and over unit for endurance. Obstacle course performed with RW and min assist negotiating obstacles and stepping over low and 4" high obstacles, cues for safety with RW. Balance activities working on balance reactions (particularly hip reactions) in response to manual perturbations with normal stance and with one foot on step for single limb stance stability. Pt most impaired with ability to react to posterior losses of balance.   Therapy Documentation Precautions:  Precautions Precautions: Fall Restrictions Weight Bearing Restrictions: No Pain: Pain Assessment Pain Assessment: No/denies pain Pain Score: 0-No pain   See FIM for current functional status  Therapy/Group: Individual Therapy  Wilhemina Bonito 04/23/2012, 11:53 AM

## 2012-04-23 NOTE — Progress Notes (Signed)
Social Work Assessment and Plan Social Work Assessment and Plan  Patient Details  Name: Michelle Hodges MRN: [id number] Date of Birth: [birth year]  Today's Date: 04/23/2012  Problem List:  Patient Active Problem List  Diagnosis  . HYPERCHOLESTEROLEMIA  . PERIPHERAL VASCULAR DISEASE  . INSOMNIA, HX OF  . Demyelinating disease of central nervous system  . History of CVA (cerebrovascular accident)  . Weakness of right side of body  . Multiple sclerosis  . Abdominal distention  . Loss of weight  . Dilatation of colon  . MS (multiple sclerosis)   Past Medical History:  Past Medical History  Diagnosis Date  . History of blood transfusion    Past Surgical History:  Past Surgical History  Procedure Laterality Date  . Tonsillectomy    . Colonoscopy N/A 04/20/2012    Procedure: COLONOSCOPY;  Surgeon: Beverley Fiedler, MD;  Location: [facility] ENDOSCOPY;  Service: Gastroenterology;  Laterality: N/A;   Social History:  reports that she has never smoked. She does not have any smokeless tobacco history on file. She reports that  drinks alcohol. She reports that she uses illicit drugs (Marijuana).  Family / Support Systems Marital Status: Divorced How Long?: 30+ yrs Patient Roles: Parent Children: daughter, Michelle Hodges @ (H) [phone] or (C) [phone];  another daughter who lives out of state Anticipated Caregiver: Daughter: Michelle Hodges Ability/Limitations of Caregiver: none Caregiver Availability: Intermittent Family Dynamics: daughter very attentive and involved with interview process and d/c planning discussion.  no familial strains noted.  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   Return to Work Plans: Pt reports she would like to be able to return to her job if possible - "I'm very independentPrint production planner Issues: None Guardian/Conservator: None   Abuse/Neglect Physical Abuse: Denies Verbal Abuse: Denies Sexual Abuse: Denies Exploitation of patient/patient's resources: Denies Self-Neglect: Denies  Emotional Status Pt's affect, behavior adn adjustment status: Pt very pleasant, talkative and fully oriented.  Admits she is "frightened" by possibility that she has MS and of losing her independence.  Daughter offers encouragement to her and pt stresses that she is trying to remain optimistic overall.  Admits some frustration with herself that she had been having functional declines over past few years, "...but I didn't want to admit it.Marland KitchenMarland KitchenMarland KitchenI'm just so independent."  Denies any s/s of anxiety.  Does present with minimal s/s of depression and will recommend formal neuropsych referral for coping and depression screening. Recent Psychosocial Issues: None Pyschiatric History: None Substance Abuse History: None  Patient / Family Perceptions, Expectations & Goals Pt/Family understanding of illness & functional limitations: Pt and daughter report the confirmation of disease is "still out", but that doctors feel it is likely MS.  Both with good understanding of current functional limitations and need for CIR. Premorbid pt/family roles/activities: Despited steady functional decline over several years, pt has continued to live alone and work her part-time job Paramedic at [employer].  Daughter has increasingly been providing assistance as pt needed. Anticipated changes in roles/activities/participation: pt plans to stay with daughter initially upon d/c and daughter taking FMLA - pt hopeful, however, to get back to her own home Pt/family expectations/goals: Pt hopes to regains an independent level and be able to return to her condo.  Community Resources [employer]: None Premorbid Home Care/DME Agencies: None Transportation available at discharge: yes Resource referrals  recommended:  Neuropsychology;Advocacy groups;Support group (specify)  Discharge Planning Living Arrangements: Alone Type of Residence: Private residence Insurance Resources: Medicare;Medicaid (specify county) Surveyor, quantity Resources: Social Doctor, hospital Screen Referred: No Living Expenses: Psychologist, sport and exercise Management: Patient Do you have any problems obtaining your medications?: No Home Management: pt Patient/Family Preliminary Plans: Pt to initially d/c to daughter's home with daughter taking FMLA Social Work Anticipated Follow Up Needs: HH/OP;Support Group Expected length of stay: 10-14 days  Clinical Impression Very pleasant woman here after chronic functional decline.  Work-up results still pending, however, likely to be MS diagnosis. Pt "very independent" woman and has very supportive daughter who plan to assist initially upon d/c.  Will connect with MS supports once diagnosis is confirmed.  Michelle Hodges 04/23/2012, 2:49 PM

## 2012-04-24 ENCOUNTER — Inpatient Hospital Stay (HOSPITAL_COMMUNITY): Payer: Medicare Other

## 2012-04-24 ENCOUNTER — Inpatient Hospital Stay (HOSPITAL_COMMUNITY): Payer: Medicare Other | Admitting: *Deleted

## 2012-04-24 ENCOUNTER — Inpatient Hospital Stay (HOSPITAL_COMMUNITY): Payer: Medicare Other | Admitting: Physical Therapy

## 2012-04-24 LAB — OLIGOCLONAL BANDS, CSF + SERM

## 2012-04-24 LAB — MYELIN BASIC PROTEIN, CSF: Myelin Basic Protein: <2 mcg/L (ref 0.0–4.0)

## 2012-04-24 MED ORDER — TRAMADOL HCL 50 MG PO TABS: 50.0000 mg | ORAL_TABLET | Freq: Four times a day (QID) | ORAL | Status: DC | PRN

## 2012-04-24 MED ORDER — DICLOFENAC SODIUM 1 % TD GEL
2.0000 g | Freq: Four times a day (QID) | TRANSDERMAL | Status: DC
  Administered 2012-04-24 – 2012-04-28 (×18): 2 g via TOPICAL
  Filled 2012-04-24: qty 100

## 2012-04-24 NOTE — Progress Notes (Addendum)
Physical Therapy Note  Patient Details  Name: Michelle Hodges MRN: [id number] Date of Birth: 06/08/1945 Today's Date: 04/24/2012  1300-1355 (55 minutes) group Pain: no reported pain Pt participated in PT group session focused on gait training,safety,endurance. Treatment: Pt ambulates 80 feet X 2 RW min to close SBA with vcs to increase BOS (left LE adducts); Nustep Level 4 X 10 minutes; sit to stand from mat min assist with hands on knees to facilitate quad strengthening.   1505-1600 (55 minutes) individual Pain : no reported pain Focus of treatment: Therapeutic exercises focused on bilateral LE strengthening/control/ standing balance; gait without AD Treatment: Transfers RW SBA; sit to supine SBA (mat) ; supine to side to sit SBA with vcs for technique; bilateral LE strengthening X 15- hip flexion concentrating on eccentric control, hip abduction; standing ball toss, standing reaching while one step forward/backward; single leg stance < 3 seconds decreased on right; alternate stepping to 4 inch step min assist for balance X 2; gait to room without AD min to close SBA with stagger to right X 2 with assist for balance.    Joey Lierman,JIM 04/24/2012, 2:03 PM

## 2012-04-24 NOTE — Progress Notes (Signed)
Occupational Therapy Session Note  Patient Details  Name: Michelle Hodges MRN: [id number] Date of Birth: 1945-03-21  Today's Date: 04/24/2012 Time:  -   1015-1100  (45 min)     Short Term Goals: Week 1:  OT Short Term Goal 1 (Week 1): Short Term Goals = Long Term Goals secondary to ELOS  Skilled Therapeutic Interventions/Progress Updates:    Pt. Talking to daughter, [name] upon OT arrival.  Pt. Agreed to shower. Pt. Gathered supplies with min verbal cues.   Ambulated with RW to toilet plus minimal assist.  Performed toileting with supervision.  Pt. Verbalized the sequence to go from sit to stand with daughter's questioning cues.  Pt. Ambulated to shower.  Pt.stood with minimal assist and holding to grab bar to steady self. Ambulated out to sink area to dress. Stood to don pants with BLE supported against the wc and did not use hands to hold.  Pt disagreeing with daughter when she came to hospital.  Pt adament about when she came in; daughter showed her and pt conceded she was wrong.     Therapy Documentation Precautions:  Precautions Precautions: Fall Restrictions Weight Bearing Restrictions: No       Pain: Pain Assessment Pain Score:  3/10      See FIM for current functional status  Therapy/Group: Individual Therapy  Humberto Seals 04/24/2012, 12:48 PM

## 2012-04-24 NOTE — Progress Notes (Signed)
Speech Language Pathology Daily Session Note  Patient Details  Name: Michelle Hodges MRN: [id number] Date of Birth: 02-14-1945  Today's Date: 04/24/2012 Time: 7425-9563 Time Calculation (min): 29 min  Short Term Goals: Week 1: SLP Short Term Goal 1 (Week 1): Pt will utilize external memory aids to increase recall/carryover of newly learned information with Min A verbal and visual cues.  SLP Short Term Goal 1 - Progress (Week 1): Progressing toward goal SLP Short Term Goal 2 (Week 1): Pt will demonstrate functional problem solving with basic and familiar task with supervision verbal cues.  SLP Short Term Goal 2 - Progress (Week 1): Progressing toward goal SLP Short Term Goal 3 (Week 1): Pt will decrease tangents during functional conversation with Min question cues.  SLP Short Term Goal 3 - Progress (Week 1): Progressing toward goal SLP Short Term Goal 4 (Week 1): Pt will demonstrate efficient mastication of regular textures with Mod I.   Skilled Therapeutic Interventions: Skilled tretement focusued on addressing cognitive goals. SLP facilitated by providing moderate question cues to decrease tangential communication during conversation regarding medical event immediately prior to admission.  Daughter [name] present and reports mother was somewhat tangential in communication prior to admission. In addition, SLP facilitated use of external memory aid by writing steps for use of walker given pt required step-by-step cueing (max level) by daughter to transit from bed to chair during session.   Later in they day, SLP provided son-in-law with written step by step instructions for walker use and asked him to assure it was used today- as pt was in her therapy session.  SLP did not address mastication today.     FIM:  Comprehension Comprehension Mode: Auditory Comprehension: 6-Follows complex conversation/direction: With extra time/assistive device Expression Expression Mode: Verbal Expression:  5-Expresses basic needs/ideas: With extra time/assistive device Social Interaction Social Interaction: 6-Interacts appropriately with others with medication or extra time (anti-anxiety, antidepressant). Problem Solving Problem Solving: 4-Solves basic 75 - 89% of the time/requires cueing 10 - 24% of the time Memory Memory: 3-Recognizes or recalls 50 - 74% of the time/requires cueing 25 - 49% of the time   Pain  no pain  Therapy/Group: Individual Therapy  Donavan Burnet, MS [facility] SLP [phone]

## 2012-04-24 NOTE — Progress Notes (Signed)
Subjective/Complaints: Happy with progress. Up at sink washing up already this am. A 12 point review of systems has been performed and if not noted above is otherwise negative.   Objective: Vital Signs: Blood pressure 129/79, pulse 57, temperature 98.2 F (36.8 C), temperature source Oral, resp. rate 18, weight 62.6 kg (138 lb 0.1 oz), SpO2 99.00%. No results found.  Recent Labs  04/22/12 0643  WBC 7.4  HGB 11.6*  HCT 34.3*  PLT 200    Recent Labs  04/22/12 0643  NA 145  K 3.9  CL 106  GLUCOSE 88  BUN 10  CREATININE 0.90  CALCIUM 9.0   CBG (last 3)   Recent Labs  04/21/12 1152 04/21/12 1644  GLUCAP 70 102*    Wt Readings from Last 3 Encounters:  04/21/12 62.6 kg (138 lb 0.1 oz)  04/15/12 56.7 kg (125 lb)  04/15/12 56.7 kg (125 lb)    Physical Exam:  Constitutional: She is oriented to person, place, and time.  HENT: dentition fair, mucosa pink and moist  Head: Normocephalic.  Eyes: EOM are normal.  Neck: Neck supple. No thyromegaly present.  Cardiovascular: Normal rate and regular rhythm. No M,R,G  Pulmonary/Chest: Effort normal and breath sounds normal. No respiratory distress.  Abdominal: Bowel sounds are normal. She exhibits no distension.  Musculoskeletal: She exhibits no edema.  Neurological: She is alert and oriented to person, place, and time.  Patient followed basic commands. She did need some cues for medical history. Very pleasant still sometimes hard to keep on task. Sensation 1 to 1+ right leg, arm, face. Mild sensory loss left leg. Tone improved in LE's. No gross visual abnl. Decreased FMC of right arm and leg on exam. A little impulsive. Skin: Skin is warm and dry   Assessment/Plan: 1. 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 Physiatrist is providing close team supervision and 24 hour management of active medical problems  listed below. Physiatrist and rehab team continue to assess barriers to discharge/monitor patient progress toward functional and medical goals. FIM: FIM - Bathing Bathing Steps Patient Completed: Chest;Right Arm;Left Arm;Abdomen;Front perineal area;Buttocks;Left upper leg;Right upper leg Bathing: 4: Min-Patient completes 8-9 72f 10 parts or 75+ percent  FIM - Upper Body Dressing/Undressing Upper body dressing/undressing steps patient completed: Thread/unthread right sleeve of pullover shirt/dresss;Put head through opening of pull over shirt/dress;Thread/unthread left sleeve of pullover shirt/dress;Pull shirt over trunk Upper body dressing/undressing: 4: Steadying assist FIM - Lower Body Dressing/Undressing Lower body dressing/undressing steps patient completed: Thread/unthread right underwear leg;Thread/unthread left underwear leg;Pull underwear up/down;Thread/unthread right pants leg;Thread/unthread left pants leg;Pull pants up/down;Fasten/unfasten pants;Don/Doff left shoe;Don/Doff right shoe;Fasten/unfasten right shoe;Fasten/unfasten left shoe Lower body dressing/undressing: 4: Min-Patient completed 75 plus % of tasks  FIM - Toileting Toileting steps completed by patient: Adjust clothing prior to toileting;Performs perineal hygiene Toileting: 4: Steadying assist  FIM - Diplomatic Services operational officer Devices: Bedside commode Toilet Transfers: 4-From toilet/BSC: Min A (steadying Pt. > 75%);4-To toilet/BSC: Min A (steadying Pt. > 75%)  FIM - Bed/Chair Transfer Bed/Chair Transfer Assistive Devices: Bed rails;Arm rests Bed/Chair Transfer: 5: Supine > Sit: Supervision (verbal cues/safety issues);5: Sit > Supine: Supervision (verbal cues/safety issues);4: Bed > Chair or W/C: Min A (steadying Pt. > 75%);4: Chair or W/C > Bed: Min A (steadying Pt. > 75%)  FIM - Locomotion: Wheelchair Distance: 100 Locomotion: Wheelchair: 5: Travels 150 ft or more: maneuvers on rugs and over door  sills with  supervision, cueing or coaxing FIM - Locomotion: Ambulation Locomotion: Ambulation Assistive Devices: Walker - Rolling Ambulation/Gait Assistance: 4: Min assist Locomotion: Ambulation: 4: Travels 150 ft or more with minimal assistance (Pt.>75%)  Comprehension Comprehension Mode: Auditory Comprehension: 6-Follows complex conversation/direction: With extra time/assistive device  Expression Expression Mode: Verbal Expression: 5-Expresses basic needs/ideas: With extra time/assistive device  Social Interaction Social Interaction: 6-Interacts appropriately with others with medication or extra time (anti-anxiety, antidepressant).  Problem Solving Problem Solving: 4-Solves basic 75 - 89% of the time/requires cueing 10 - 24% of the time  Memory Memory: 3-Recognizes or recalls 50 - 74% of the time/requires cueing 25 - 49% of the time Medical Problem List and Plan:  1. Demyelinating disorder/MS. LP with increased protein. Other results still pending. 2. DVT Prophylaxis/Anticoagulation: SCDs/ambulation 3. Neuropsych: This patient is capable of making decisions on his/her own behalf.  4. Remote lateral cortical and subcortical infarcts. Continue aspirin therapy 5. Tone- ROM, positioning for now. Has shown improvement since admit.   LOS (Days) 3 A FACE TO FACE EVALUATION WAS PERFORMED  Cybil Sen[place] T 04/24/2012 8:09 AM

## 2012-04-25 ENCOUNTER — Inpatient Hospital Stay (HOSPITAL_COMMUNITY): Payer: Medicare Other | Admitting: Physical Therapy

## 2012-04-25 NOTE — Progress Notes (Signed)
Subjective/Complaints: Neck feeling much better with voltaren and ultram--no complaints this am A 12 point review of systems has been performed and if not noted above is otherwise negative.   Objective: Vital Signs: Blood pressure 127/75, pulse 56, temperature 98 F (36.7 C), temperature source Oral, resp. rate 18, weight 62.6 kg (138 lb 0.1 oz), SpO2 99.00%. No results found. No results found for this basename: WBC, HGB, HCT, PLT,  in the last 72 hours No results found for this basename: NA, K, CL, CO, GLUCOSE, BUN, CREATININE, CALCIUM,  in the last 72 hours CBG (last 3)  No results found for this basename: GLUCAP,  in the last 72 hours  Wt Readings from Last 3 Encounters:  04/21/12 62.6 kg (138 lb 0.1 oz)  04/15/12 56.7 kg (125 lb)  04/15/12 56.7 kg (125 lb)    Physical Exam:  Constitutional: She is oriented to person, place, and time.  HENT: dentition fair, mucosa pink and moist  Head: Normocephalic.  Eyes: EOM are normal.  Neck: Neck supple. No thyromegaly present.  Cardiovascular: Normal rate and regular rhythm. No M,R,G  Pulmonary/Chest: Effort normal and breath sounds normal. No respiratory distress.  Abdominal: Bowel sounds are normal. She exhibits no distension.  Musculoskeletal: She exhibits no edema. Neck with minimal pain today. Neurological: She is alert and oriented to person, place, and time.  Patient followed basic commands. She did need some cues for medical history. Very pleasant still sometimes hard to keep on task. Sensation 1 to 1+ right leg, arm, face. Mild sensory loss left leg. Tone improved in LE's. No gross visual abnl. Decreased FMC of right arm and leg on exam. A little impulsive. Skin: Skin is warm and dry   Assessment/Plan: 1. 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 Physiatrist is providing close team supervision and 24 hour  management of active medical problems listed below. Physiatrist and rehab team continue to assess barriers to discharge/monitor patient progress toward functional and medical goals. FIM: FIM - Bathing Bathing Steps Patient Completed: Chest;Right Arm;Left Arm;Abdomen;Front perineal area;Buttocks;Left upper leg;Right upper leg;Right lower leg (including foot);Left lower leg (including foot) Bathing: 4: Steadying assist  FIM - Upper Body Dressing/Undressing Upper body dressing/undressing steps patient completed: Thread/unthread right sleeve of pullover shirt/dresss;Put head through opening of pull over shirt/dress;Thread/unthread left sleeve of pullover shirt/dress;Pull shirt over trunk Upper body dressing/undressing: 4: Steadying assist FIM - Lower Body Dressing/Undressing Lower body dressing/undressing steps patient completed: Thread/unthread right underwear leg;Thread/unthread left underwear leg;Pull underwear up/down;Thread/unthread right pants leg;Thread/unthread left pants leg;Pull pants up/down;Fasten/unfasten pants;Don/Doff left shoe;Don/Doff right shoe;Fasten/unfasten right shoe;Fasten/unfasten left shoe Lower body dressing/undressing: 4: Steadying Assist  FIM - Toileting Toileting steps completed by patient: Performs perineal hygiene;Adjust clothing after toileting;Adjust clothing prior to toileting Toileting Assistive Devices: Grab bar or rail for support Toileting: 5: Supervision: Safety issues/verbal cues  FIM - Diplomatic Services operational officer Devices: Grab bars;Walker Toilet Transfers: 4-To toilet/BSC: Min A (steadying Pt. > 75%);4-From toilet/BSC: Min A (steadying Pt. > 75%)  FIM - Bed/Chair Transfer Bed/Chair Transfer Assistive Devices: Therapist, occupational: 5: Bed > Chair or W/C: Supervision (verbal cues/safety issues);5: Chair or W/C > Bed: Supervision (verbal cues/safety issues)  FIM - Locomotion: Wheelchair Distance: 100 Locomotion: Wheelchair: 5:  Travels 150 ft or more: maneuvers on rugs and over door sills with supervision, cueing or coaxing FIM - Locomotion: Ambulation Locomotion: Ambulation Assistive Devices: Designer, industrial/product Ambulation/Gait Assistance: 4: Min assist  Locomotion: Ambulation: 4: Travels 150 ft or more with minimal assistance (Pt.>75%)  Comprehension Comprehension Mode: Auditory Comprehension: 6-Follows complex conversation/direction: With extra time/assistive device  Expression Expression Mode: Verbal Expression: 5-Expresses basic needs/ideas: With extra time/assistive device  Social Interaction Social Interaction: 6-Interacts appropriately with others with medication or extra time (anti-anxiety, antidepressant).  Problem Solving Problem Solving: 4-Solves basic 75 - 89% of the time/requires cueing 10 - 24% of the time  Memory Memory: 3-Recognizes or recalls 50 - 74% of the time/requires cueing 25 - 49% of the time Medical Problem List and Plan:  1. Demyelinating disorder/MS. LP with increased protein. Other results still pending. 2. DVT Prophylaxis/Anticoagulation: SCDs/ambulation 3. Neuropsych: This patient is capable of making decisions on his/her own behalf.  4. Remote lateral cortical and subcortical infarcts. Continue aspirin therapy 5. Tone- ROM, positioning for now. Has shown improvement since admit. 6. Pain: tylenol, heat, tramadol, voltaren gel for cervicalgia, post-LP pain  -much improved   LOS (Days) 4 A FACE TO FACE EVALUATION WAS PERFORMED  SWARTZ,ZACHARY T 04/25/2012 8:38 AM

## 2012-04-25 NOTE — Progress Notes (Signed)
OccupationalTherapy Note  Patient Details  Name: Michelle Hodges MRN: [id number] Date of Birth: [birth year] Today's Date: 04/25/2012  Time:  1300-1400  (60 min) Group session Pain:  None  Pt. Engaged in functional mobility at RW level.  Focus of session was on selective attention, dynamic balance.  Pt. Utilized RW to make bed, wash dishes, fold clothes and put in drawers.  Pt had no LOB.  Needed minimal cues tfor selective attention.  Pt. Did self talking to go correctly from sit to stand and use walker safely when reaching to do activities.  Daughter, [name], checked off for ambulating pt in room.     Humberto Seals 04/25/2012, 6:49 PM

## 2012-04-26 ENCOUNTER — Inpatient Hospital Stay (HOSPITAL_COMMUNITY): Payer: Medicare Other | Admitting: Speech Pathology

## 2012-04-26 ENCOUNTER — Inpatient Hospital Stay (HOSPITAL_COMMUNITY): Payer: Medicare Other

## 2012-04-26 ENCOUNTER — Encounter (HOSPITAL_COMMUNITY): Payer: Medicare Other

## 2012-04-26 ENCOUNTER — Inpatient Hospital Stay (HOSPITAL_COMMUNITY): Payer: Medicare Other | Admitting: Physical Therapy

## 2012-04-26 DIAGNOSIS — G35 Multiple sclerosis: Secondary | ICD-10-CM

## 2012-04-26 NOTE — Progress Notes (Signed)
Speech Language Pathology Daily Session Note  Patient Details  Name: ADJOA ALTHOUSE MRN: [id number] Date of Birth: Apr 17, 1945  Today's Date: 04/26/2012 Time: 1300-1330 Time Calculation (min): 30 min  Short Term Goals: Week 1: SLP Short Term Goal 1 (Week 1): Pt will utilize external memory aids to increase recall/carryover of newly learned information with Min A verbal and visual cues.  SLP Short Term Goal 1 - Progress (Week 1): Progressing toward goal SLP Short Term Goal 2 (Week 1): Pt will demonstrate functional problem solving with basic and familiar task with supervision verbal cues.  SLP Short Term Goal 2 - Progress (Week 1): Progressing toward goal SLP Short Term Goal 3 (Week 1): Pt will decrease tangents during functional conversation with Min question cues.  SLP Short Term Goal 3 - Progress (Week 1): Progressing toward goal SLP Short Term Goal 4 (Week 1): Pt will demonstrate efficient mastication of regular textures with Mod I.   Skilled Therapeutic Interventions: Treatment focused on cognitive goals related to organization and planning. SLP facilitated session by providing Mod A verbal and visual cues to sustain attention, organize information, and plan to complete a scheduling task. Pt gathered information to complete a daily schedule, including calculating the necessary time to get from one location to another.   FIM:  Comprehension Comprehension Mode: Auditory Comprehension: 5-Follows basic conversation/direction: With extra time/assistive device Expression Expression Mode: Verbal Expression: 5-Expresses basic needs/ideas: With extra time/assistive device Social Interaction Social Interaction: 6-Interacts appropriately with others with medication or extra time (anti-anxiety, antidepressant). Problem Solving Problem Solving: 4-Solves basic 75 - 89% of the time/requires cueing 10 - 24% of the time Memory Memory: 3-Recognizes or recalls 50 - 74% of the time/requires cueing 25  - 49% of the time FIM - Eating Eating Activity: 7: Complete independence:no helper  Pain Pain Assessment Pain Assessment: No/denies pain  Therapy/Group: Individual Therapy  Maxcine Ham 04/26/2012, 3:47 PM

## 2012-04-26 NOTE — Progress Notes (Signed)
Occupational Therapy Session Note  Patient Details  Name: Michelle Hodges MRN: [id number] Date of Birth: May 23, 1945  Today's Date: 04/26/2012  Session 1 Time: 0800-0900 Time Calculation (min): 60 min  Short Term Goals: Week 1:  OT Short Term Goal 1 (Week 1): Short Term Goals = Long Term Goals secondary to ELOS  Skilled Therapeutic Interventions/Progress Updates:    Pt seated in recliner upon arrival and agreeable to engaging in bathing and dressing.  Pt elected to bathe and dress w/c level at sink.  Pt's dtr arrived in room after 15 mins and actively participated in providing supervision/assistance throughout remainder of bathing and dressing.  Pt amb with RW to gym to engage in functional amb with RW for home mgmt tasks and dynamic standing activities while engaging BUE in activities.  Pt required min verbal cues in preparation for sit to stand.  Pt uses self talk strategies during transitional movements and while ambulating.  Pt is very deliberate in all movements and somewhat rigid.  Therapy Documentation Precautions:  Precautions Precautions: Fall Restrictions Weight Bearing Restrictions: No Pain: Pain Assessment Pain Assessment: No/denies pain  See FIM for current functional status  Therapy/Group: Individual Therapy  Session 2 Time: 1345-1430 Pt denies pain Individual Therapy Pt amb with RW to gym to engage in dynamic standing tasks and funcitonal amb with RW.  Pt requires steady A when ambulating and min A when turning.  Pt exhibits occasional LOB to left and requires assistance to correct.  Pt exhibits delayed balance reactions during all standing tasks.  Pt required min verbal cues to widen stance to facilitate stability in standing.  Focus on functional ambulation with RW, dynamic standing balance, activity tolerance, and safety awareness.  Lavone Neri [facility] 04/26/2012, 10:35 AM

## 2012-04-26 NOTE — Progress Notes (Signed)
Subjective/Complaints: Had a good day. Denies headaches   A 12 point review of systems has been performed and if not noted above is otherwise negative.   Objective: Vital Signs: Blood pressure 100/64, pulse 62, temperature 98.3 F (36.8 C), temperature source Oral, resp. rate 19, weight 62.6 kg (138 lb 0.1 oz), SpO2 98.00%. No results found. No results found for this basename: WBC, HGB, HCT, PLT,  in the last 72 hours No results found for this basename: NA, K, CL, CO, GLUCOSE, BUN, CREATININE, CALCIUM,  in the last 72 hours CBG (last 3)  No results found for this basename: GLUCAP,  in the last 72 hours  Wt Readings from Last 3 Encounters:  04/21/12 62.6 kg (138 lb 0.1 oz)  04/15/12 56.7 kg (125 lb)  04/15/12 56.7 kg (125 lb)    Physical Exam:  Constitutional: She is oriented to person, place, and time.  HENT: dentition fair, mucosa pink and moist  Head: Normocephalic.  Eyes: EOM are normal.  Neck: Neck supple. No thyromegaly present.  Cardiovascular: Normal rate and regular rhythm. No M,R,G  Pulmonary/Chest: Effort normal and breath sounds normal. No respiratory distress.  Abdominal: Bowel sounds are normal. She exhibits no distension.  Musculoskeletal: She exhibits no edema. Neck with minimal pain now. Neurological: She is alert and oriented to person, place, and time.  Patient followed basic commands. She did need some cues for medical history. Very pleasant still sometimes hard to keep on task. Sensation   1+ right leg, arm, face. Mild sensory loss left leg. Tone improved in LE's. No gross visual abnl. Decreased FMC of right arm and leg on exam.  Strength4+ RUE and 4 to 5+ RLE. Skin: Skin is warm and dry   Assessment/Plan: 1. 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 Physiatrist is providing close team supervision and 24 hour management of active medical  problems listed below. Physiatrist and rehab team continue to assess barriers to discharge/monitor patient progress toward functional and medical goals. FIM: FIM - Bathing Bathing Steps Patient Completed: Chest;Right Arm;Left Arm;Abdomen;Front perineal area;Buttocks;Left upper leg;Right upper leg;Right lower leg (including foot);Left lower leg (including foot) Bathing: 4: Steadying assist  FIM - Upper Body Dressing/Undressing Upper body dressing/undressing steps patient completed: Thread/unthread right sleeve of pullover shirt/dresss;Put head through opening of pull over shirt/dress;Thread/unthread left sleeve of pullover shirt/dress;Pull shirt over trunk Upper body dressing/undressing: 4: Steadying assist FIM - Lower Body Dressing/Undressing Lower body dressing/undressing steps patient completed: Thread/unthread right underwear leg;Thread/unthread left underwear leg;Pull underwear up/down;Thread/unthread right pants leg;Thread/unthread left pants leg;Pull pants up/down;Fasten/unfasten pants;Don/Doff left shoe;Don/Doff right shoe;Fasten/unfasten right shoe;Fasten/unfasten left shoe Lower body dressing/undressing: 4: Steadying Assist  FIM - Toileting Toileting steps completed by patient: Performs perineal hygiene;Adjust clothing after toileting;Adjust clothing prior to toileting Toileting Assistive Devices: Grab bar or rail for support Toileting: 5: Supervision: Safety issues/verbal cues  FIM - Diplomatic Services operational officer Devices: Grab bars;Walker Toilet Transfers: 4-To toilet/BSC: Min A (steadying Pt. > 75%);4-From toilet/BSC: Min A (steadying Pt. > 75%)  FIM - Bed/Chair Transfer Bed/Chair Transfer Assistive Devices: Therapist, occupational: 5: Bed > Chair or W/C: Supervision (verbal cues/safety issues);5: Chair or W/C > Bed: Supervision (verbal cues/safety issues)  FIM - Locomotion: Wheelchair Distance: 100 Locomotion: Wheelchair: 5: Travels 150 ft or more: maneuvers  on rugs and over door sills with supervision, cueing or coaxing FIM - Locomotion: Ambulation Locomotion: Ambulation Assistive Devices: Designer, industrial/product Ambulation/Gait Assistance: 4:  Min assist Locomotion: Ambulation: 4: Travels 150 ft or more with minimal assistance (Pt.>75%)  Comprehension Comprehension Mode: Auditory Comprehension: 6-Follows complex conversation/direction: With extra time/assistive device  Expression Expression Mode: Verbal Expression: 5-Expresses basic needs/ideas: With extra time/assistive device  Social Interaction Social Interaction: 6-Interacts appropriately with others with medication or extra time (anti-anxiety, antidepressant).  Problem Solving Problem Solving: 4-Solves basic 75 - 89% of the time/requires cueing 10 - 24% of the time  Memory Memory: 3-Recognizes or recalls 50 - 74% of the time/requires cueing 25 - 49% of the time Medical Problem List and Plan:  1. Demyelinating disorder/MS. LP with increased protein. Other results still pending. 2. DVT Prophylaxis/Anticoagulation: SCDs/ambulation 3. Neuropsych: This patient is capable of making decisions on his/her own behalf.  4. Remote lateral cortical and subcortical infarcts. Continue aspirin therapy 5. Tone- ROM, positioning for now. Has shown improvement since admit. 6. Pain: tylenol, heat, tramadol, voltaren gel for cervicalgia, post-LP pain  -much improved--continue with current plan   LOS (Days) 5 A FACE TO FACE EVALUATION WAS PERFORMED  SWARTZ,ZACHARY T 04/26/2012 8:05 AM

## 2012-04-26 NOTE — Progress Notes (Signed)
Physical Therapy Session Note  Patient Details  Name: Michelle Hodges MRN: [id number] Date of Birth: 03/11/1945  Today's Date: 04/26/2012 Time: 1005-1100 Time Calculation (min): 55 min  Short Term Goals: Week 1:  PT Short Term Goal 1 (Week 1): = long term goals  Skilled Therapeutic Interventions/Progress Updates:    Ambulation on unit x 150', 250', 100' with RW nearing supervision level however had 3 instances of need for min assist due to lateral losses of balance once due to feet outside of RW. Practiced ambulation with RW over uneven compliant surface to simulate grassy terrain entrance to home, min assist progressing to close supervision. Ambulation x 50' with RW and close supervision over carpeted surface working on safe negotiation with RW and foot clearance with forwards, backwards, sideways stepping and turns.  Bathroom mobility with close supervision, pt had small bowel incontinence that required change of lower body clothing, pt reports she has been given strong stool softeners.   Pt is demonstrating ~60% carryover however still requires frequent safety cues with mobility. She utilizes self-talk technique to help herself remember cues and safety.   Therapy Documentation Precautions:  Precautions Precautions: Fall Restrictions Weight Bearing Restrictions: No Pain: Pain Assessment Pain Assessment: No/denies pain Pain Score: 0-No pain  See FIM for current functional status  Therapy/Group: Individual Therapy  Wilhemina Bonito 04/26/2012, 12:24 PM

## 2012-04-27 ENCOUNTER — Inpatient Hospital Stay (HOSPITAL_COMMUNITY): Payer: Medicare Other | Admitting: *Deleted

## 2012-04-27 ENCOUNTER — Inpatient Hospital Stay (HOSPITAL_COMMUNITY): Payer: Medicare Other

## 2012-04-27 ENCOUNTER — Inpatient Hospital Stay (HOSPITAL_COMMUNITY): Payer: Medicare Other | Admitting: Physical Therapy

## 2012-04-27 ENCOUNTER — Inpatient Hospital Stay (HOSPITAL_COMMUNITY): Payer: Medicare Other | Admitting: Speech Pathology

## 2012-04-27 NOTE — Progress Notes (Signed)
Physical Therapy Session Note  Patient Details  Name: Michelle Hodges MRN: [id number] Date of Birth: [birth year]  Today's Date: 04/27/2012 Time: 6213-0865 Time Calculation (min): 59 min  Short Term Goals: Week 1:  PT Short Term Goal 1 (Week 1): = long term goals  Skilled Therapeutic Interventions/Progress Updates:    Ambulation to/from gym with RW and supervision, cues still needed for widened base of support while turning (pt's stance becomes very narrowed and sometimes she has difficulty with RW placement). Practiced floor transfer x 2 reps, min-guard assist progressing to supervision with second attempt. Discussed safety after fall and need to call for ambulation if pt in pain or if she suffers head trauma. Corner balance HEP initiated, practiced with varied bases of support and varied headmovements/closed eyes. Pt's daughter provided supervision for activity, PT created a handout for progression. Stairs x 4 with daughter providing supervision appropriately. NuStep level 6 x 10 min with just bil. LEs for strengthenig. Cues for Rt. LE abduction to maintain neutral Rt. LE alignment.   Pt's daughter present throughout treatment and educated along with pt.  Therapy Documentation Precautions:  Precautions Precautions: Fall Restrictions Weight Bearing Restrictions: No Pain:  no c/o   See FIM for current functional status  Therapy/Group: Individual Therapy  Wilhemina Bonito 04/27/2012, 12:19 PM

## 2012-04-27 NOTE — Progress Notes (Signed)
Occupational Therapy Session Note  Patient Details  Name: Michelle Hodges MRN: [id number] Date of Birth: 1946/01/06  Today's Date: 04/27/2012 Time: 0700-0757 Time Calculation (min): 57 min  Short Term Goals: Week 1:  OT Short Term Goal 1 (Week 1): Short Term Goals = Long Term Goals secondary to ELOS  Skilled Therapeutic Interventions/Progress Updates:    Pt seated in recliner upon arrival.  Pt amb with RW to sink for bathing and dressing with sit<>stand from w/c.  Pt completed all tasks with supervision and verbal cues for safety. Pt amb with RW to bathroom for toileting.  Pt required steady A for navigating over uneven surfaces.  Pt cleaned up supplies at end of session and transitioned to ADL apartment to practice tub bench transfers at supervision level.  Pt's dtr Allision present to observe and provided appropriate supervision/assistance.  Therapy Documentation Precautions:  Precautions Precautions: Fall Restrictions Weight Bearing Restrictions: No Pain: Pain Assessment Pain Assessment: No/denies pain  See FIM for current functional status  Therapy/Group: Individual Therapy  Rich Brave 04/27/2012, 7:59 AM

## 2012-04-27 NOTE — Progress Notes (Signed)
Social Work Patient ID: Michelle Hodges, female   DOB: 06-Oct-1945, 67 y.o.   MRN: [id number]   Met with patient and daughter a few times today to review team conference and review d/c referral needs.  Pt and daughter aware and agreeable with targeted d/c 3/27 with plan for [facility] follow up.  Plan is still for pt to go to daughter's home initially.  Both very interested in getting results from testing done to determine if she has MS.  Also with questions about Neurology follow up.  I am assisting pt with getting a primary MD and have provided information on the PACE program, [facility] and other general support resources.  Will continue to follow and assist with remaining d/c referral needs.  Swain Acree, LCSW

## 2012-04-27 NOTE — Progress Notes (Signed)
Subjective/Complaints: No complaints.   Slept well.  A 12 point review of systems has been performed and if not noted above is otherwise negative.   Objective: Vital Signs: Blood pressure 118/64, pulse 57, temperature 98 F (36.7 C), temperature source Oral, resp. rate 19, weight 62.6 kg (138 lb 0.1 oz), SpO2 100.00%. No results found. No results found for this basename: WBC, HGB, HCT, PLT,  in the last 72 hours No results found for this basename: NA, K, CL, CO, GLUCOSE, BUN, CREATININE, CALCIUM,  in the last 72 hours CBG (last 3)  No results found for this basename: GLUCAP,  in the last 72 hours  Wt Readings from Last 3 Encounters:  04/21/12 62.6 kg (138 lb 0.1 oz)  04/15/12 56.7 kg (125 lb)  04/15/12 56.7 kg (125 lb)    Physical Exam:  Constitutional: She is oriented to person, place, and time.  HENT: dentition fair, mucosa pink and moist  Head: Normocephalic.  Eyes: EOM are normal.  Neck: Neck supple. No thyromegaly present.  Cardiovascular: Normal rate and regular rhythm. No M,R,G  Pulmonary/Chest: Effort normal and breath sounds normal. No respiratory distress.  Abdominal: Bowel sounds are normal. She exhibits no distension.  Musculoskeletal: She exhibits no edema. Neck with minimal pain now. Neurological: She is alert and oriented to person, place, and time.  Patient followed basic commands. She did need some cues for medical history. Very pleasant still sometimes hard to keep on task. Sensation   1+ right leg, arm, face. Mild sensory loss left leg. Tone improved in LE's. No gross visual abnl. Decreased FMC of right arm and leg on exam.  Strength4+ RUE and 4 to 5+ RLE. Skin: Skin is warm and dry   Assessment/Plan: 1. 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 Physiatrist is providing close team supervision and 24 hour management of active medical problems  listed below. Physiatrist and rehab team continue to assess barriers to discharge/monitor patient progress toward functional and medical goals. FIM: FIM - Bathing Bathing Steps Patient Completed: Chest;Right Arm;Left Arm;Abdomen;Front perineal area;Buttocks;Left upper leg;Right upper leg;Right lower leg (including foot);Left lower leg (including foot) Bathing: 5: Supervision: Safety issues/verbal cues  FIM - Upper Body Dressing/Undressing Upper body dressing/undressing steps patient completed: Thread/unthread right sleeve of pullover shirt/dresss;Put head through opening of pull over shirt/dress;Thread/unthread left sleeve of pullover shirt/dress;Pull shirt over trunk Upper body dressing/undressing: 5: Supervision: Safety issues/verbal cues FIM - Lower Body Dressing/Undressing Lower body dressing/undressing steps patient completed: Thread/unthread right underwear leg;Thread/unthread left underwear leg;Pull underwear up/down;Thread/unthread right pants leg;Thread/unthread left pants leg;Pull pants up/down;Fasten/unfasten pants;Don/Doff left shoe;Don/Doff right shoe;Fasten/unfasten right shoe;Fasten/unfasten left shoe;Don/Doff right sock;Don/Doff left sock Lower body dressing/undressing: 5: Supervision: Safety issues/verbal cues  FIM - Toileting Toileting steps completed by patient: Adjust clothing prior to toileting;Performs perineal hygiene;Adjust clothing after toileting Toileting Assistive Devices: Grab bar or rail for support Toileting: 5: Supervision: Safety issues/verbal cues  FIM - Diplomatic Services operational officer Devices: Grab bars;Walker Toilet Transfers: 4-To toilet/BSC: Min A (steadying Pt. > 75%);4-From toilet/BSC: Min A (steadying Pt. > 75%)  FIM - Bed/Chair Transfer Bed/Chair Transfer Assistive Devices: Therapist, occupational: 5: Bed > Chair or W/C: Supervision (verbal cues/safety issues);5: Chair or W/C > Bed: Supervision (verbal cues/safety issues)  FIM -  Locomotion: Wheelchair Distance: 100 Locomotion: Wheelchair: 5: Travels 150 ft or more: maneuvers on rugs and over door sills with supervision, cueing or coaxing FIM - Locomotion: Ambulation Locomotion:  Ambulation Assistive Devices: Designer, industrial/product Ambulation/Gait Assistance: 4: Min assist Locomotion: Ambulation: 4: Travels 150 ft or more with minimal assistance (Pt.>75%)  Comprehension Comprehension Mode: Auditory Comprehension: 6-Follows complex conversation/direction: With extra time/assistive device  Expression Expression Mode: Verbal Expression: 6-Expresses complex ideas: With extra time/assistive device  Social Interaction Social Interaction: 6-Interacts appropriately with others with medication or extra time (anti-anxiety, antidepressant).  Problem Solving Problem Solving: 4-Solves basic 75 - 89% of the time/requires cueing 10 - 24% of the time  Memory Memory: 3-Recognizes or recalls 50 - 74% of the time/requires cueing 25 - 49% of the time Medical Problem List and Plan:  1. Demyelinating disorder/MS. LP with increased protein. Other results still pending. 2. DVT Prophylaxis/Anticoagulation: SCDs/ambulation 3. Neuropsych: This patient is capable of making decisions on his/her own behalf.  4. Remote lateral cortical and subcortical infarcts. Continue aspirin therapy 5. Tone- ROM, positioning for now. Has shown improvement along with fmc since admit 6. Pain: tylenol, heat, tramadol, voltaren gel for cervicalgia, post-LP pain  -much improved--continue with current plan   LOS (Days) 6 A FACE TO FACE EVALUATION WAS PERFORMED  Linah Klapper T 04/27/2012 7:37 AM

## 2012-04-27 NOTE — Progress Notes (Addendum)
Speech Language Pathology Daily Session Note  Patient Details  Name: Michelle Hodges MRN: [id number] Date of Birth: [birth year]  Today's Date: 04/27/2012 Time: 4098-1191  Time Calculation (min): 45 min  Short Term Goals: Week 1: SLP Short Term Goal 1 (Week 1): Pt will utilize external memory aids to increase recall/carryover of newly learned information with Min A verbal and visual cues.  SLP Short Term Goal 1 - Progress (Week 1): Progressing toward goal SLP Short Term Goal 2 (Week 1): Pt will demonstrate functional problem solving with basic and familiar task with supervision verbal cues.  SLP Short Term Goal 2 - Progress (Week 1): Progressing toward goal SLP Short Term Goal 3 (Week 1): Pt will decrease tangents during functional conversation with Min question cues.  SLP Short Term Goal 3 - Progress (Week 1): Progressing toward goal SLP Short Term Goal 4 (Week 1): Pt will demonstrate efficient mastication of regular textures with Mod I.   Skilled Therapeutic Interventions: Treatment focus on cognitive goals. SLP facilitated session by providing supervision question cues for organization of a familiar recipe. Pt also required Mod A question and verbal cues for thought organization during a generative naming/categorizing task.  Pt navigated to and from her room to the SLP office with Mod I. Pt also demonstrated divided attention with a functional conversation wile ambulating down the hallway with a rolling walker.    FIM:  Comprehension Comprehension Mode: Auditory Comprehension: 6-Follows complex conversation/direction: With extra time/assistive device Expression Expression Mode: Verbal Expression: 6-Expresses complex ideas: With extra time/assistive device Social Interaction Social Interaction: 5-Interacts appropriately 90% of the time - Needs monitoring or encouragement for participation or interaction. Problem Solving Problem Solving: 5-Solves basic 90% of the time/requires cueing <  10% of the time Memory Memory: 5-Recognizes or recalls 90% of the time/requires cueing < 10% of the time FIM - Eating Eating Activity: 7: Complete independence:no helper  Pain Pain Assessment Pain Assessment: No/denies pain  Therapy/Group: Individual Therapy  [name] 04/27/2012, 2:49 PM

## 2012-04-27 NOTE — Consult Note (Signed)
04/26/12  PSYCHOSOCIAL NOTE - CONFIDENTIAL  [facility]   Mrs. Michelle Hodges was seen on the [facility] Unit.  According to medical records, she was admitted to the rehab unit owing to functional deficits secondary to MS/demyelinating disorder. A neuropsychology consult was ordered to assess her current cognitive and emotional status subsequent to her present medical status and to assist with treatment planning.  Michelle Hodges reported suffering from reduced memory functioning and issues with attention and processing speed. Over the years she has also experienced sudden onset falls for no known reason. It was felt that these symptoms may have represented the early signs of MS. The patient expressed being concern about her present medical situation and wished she had sought treatment sooner.    The plan for today was to introduce Michelle Hodges to neuropsychology services and complete cognitive testing. However, she was quite fatigued and asked if we could reschedule the testing session. I offered to have my colleague Dr. Wylene Simmer see her this Thursday but she wished for me to see her so the testing will be completed next Monday.   The remainder of the appointment was spent educating Michelle Hodges and her daughter on MS and other demyelinating disorders.   Plan:    Schedule cognitive testing for next Monday   See patient for ego support and supportive psychotherapy   Provide further education as needed.   Greater than 50% of this visit was spent educating the patient about the possible diagnosis, prognosis, management plan, and in coordination of care.   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   Debbe Mounts, Psy.D.  Clinical Neuropsychologist

## 2012-04-27 NOTE — Progress Notes (Signed)
Physical Therapy Note  Patient Details  Name: Michelle Hodges MRN: [id number] Date of Birth: 1945-03-15 Today's Date: 04/27/2012  Time: 1100-1130 30 minutes co-tx with TR  No c/o pain. Gait with RW in controlled environment close supervision 2 x 200', no LOB.  Standing balance training without AD with min A for alternating ball toss/kick while performing cognitive tasks.  Pt requires min A for balance, min A for staying on topic during cognitive task.  Individual therapy  DONAWERTH,KAREN 04/27/2012, 11:33 AM

## 2012-04-27 NOTE — Evaluation (Signed)
Recreational Therapy Assessment and Plan  Patient Details  Name: Michelle Hodges MRN: [id number] Date of Birth: 25-Nov-1945 Today's Date: 04/27/2012  Rehab Potential: Excellent ELOS: 10 days   Assessment Clinical Impression:Problem List:  Patient Active Problem List   Diagnosis   .  HYPERCHOLESTEROLEMIA   .  PERIPHERAL VASCULAR DISEASE   .  INSOMNIA, HX OF   .  Demyelinating disease of central nervous system   .  History of CVA (cerebrovascular accident)   .  Weakness of right side of body   .  Multiple sclerosis   .  Abdominal distention   .  Loss of weight   .  Dilatation of colon   .  MS (multiple sclerosis)    Past Medical History:  Past Medical History   Diagnosis  Date   .  History of blood transfusion     Past Surgical History:  Past Surgical History   Procedure  Laterality  Date   .  Tonsillectomy     .  Colonoscopy  N/A  04/20/2012     Procedure: COLONOSCOPY; Surgeon: Beverley Fiedler, MD; Location: [facility] ENDOSCOPY; Service: Gastroenterology; Laterality: N/A;    Assessment & Plan  Clinical Impression:Michelle Hodges is a 67 y.o. right-handed female admitted 04/15/2012 with altered mental status and right lower extremity weakness that originally had onset acutely 2 years ago but was never evaluated. The family also reports weight loss of 30 pounds. MRI of the brain shows no evidence for acute however there was subacute infarct noted remote bilateral cortical and subcortical infarcts as well as diffuse white matter disease compatible with chronic microvascular ischemia. MRA of the head with no stenosis or occlusion. Echocardiogram with ejection fraction of 60% no wall motion abnormalities. Carotid Dopplers with no ICA stenosis. Neurology services consulted questionable multiple sclerosis placed on intravenous Solu-Medrol x3 days 04/16/2012 and completed 04/20/2012. Patient initially refused lumbar puncture later agreed and await findings. Consult to gastroenterology( Dr. Rhea Belton)  in reference to history of weight loss as well as complaints of left lower quadrant pain and bloating with intermittent diarrhea. CT scan of the abdomen completed question sigmoid volvulus and plan colonoscopy was completed 04/20/2012 showing no evidence of left colon mass or volvulus and retroflexion not performed due to presence of stool. A barium contrast enema completed with followup completion of colonoscopy again being unremarkable 04/21/2012. Patient transferred to CIR on 04/21/2012 .   Pt presents with decreased activity tolerance, decreased functional mobility, decreased balance, decreased attention, delayed processing, decreased problem solving, decreased safety awareness, decreased memory Limiting pt's independence with leisure/community pursuits.   Leisure History/Participation Premorbid leisure interest/current participation: Sports - Exercise (Comment);Community - Press photographer - Physicist, medical Expression Interests: Music (Comment) Other Leisure Interests: Television;Cooking/Baking Leisure Participation Style: With Family/Friends;Alone Awareness of Community Resources: Excellent Psychosocial / Spiritual Social interaction - Mood/Behavior: Cooperative Firefighter Appropriate for Education?: Yes Patient Agreeable to Outing?: Yes Recreational Therapy Orientation Orientation -Reviewed with patient: Available activity resources Strengths/Weaknesses Patient Strengths/Abilities: Willingness to participate;Active premorbidly Patient weaknesses: Physical limitations  Plan Rec Therapy Plan Is patient appropriate for Therapeutic Recreation?: Yes Rehab Potential: Excellent Treatment times per week: Min 2 times for community reintegration education/training Estimated Length of Stay: 10 days TR Treatment/Interventions: Adaptive equipment instruction;1:1 session;Balance/vestibular training;Functional mobility training;Community reintegration;Patient/family  education;Therapeutic activities;Recreation/leisure participation;Therapeutic exercise  Recommendations for other services: None  Discharge Criteria: Patient will be discharged from TR if patient refuses treatment 3 consecutive times without medical reason.  If treatment goals not met, if there  is a change in medical status, if patient makes no progress towards goals or if patient is discharged from hospital.  The above assessment, treatment plan, treatment alternatives and goals were discussed and mutually agreed upon: by patient  Pershing Skidmore 04/27/2012, 11:50 AM

## 2012-04-27 NOTE — Patient Care Conference (Signed)
Inpatient RehabilitationTeam Conference and Plan of Care Update Date: 04/27/2012   Time: 2:25 PM    Patient Name: Michelle Hodges      Medical Record Number: [id number]  Date of Birth: 10-09-1945 Sex: Female         Room/Bed: 4010/4010-01 Payor Info: Payor: MEDICARE  Plan: MEDICARE PART A AND B  Product Type: *No Product type*     Admitting Diagnosis: LE Weakness  Admit Date/Time:  04/21/2012  5:43 PM Admission Comments: No comment available   Primary Diagnosis:  MS (multiple sclerosis) Principal Problem: MS (multiple sclerosis)  Patient Active Problem List   Diagnosis Date Noted  . MS (multiple sclerosis) 04/22/2012  . Dilatation of colon 04/20/2012  . Abdominal distention 04/17/2012  . Loss of weight 04/17/2012  . Demyelinating disease of central nervous system 04/16/2012  . History of CVA (cerebrovascular accident) 04/16/2012  . Weakness of right side of body 04/16/2012  . Multiple sclerosis 04/16/2012  . HYPERCHOLESTEROLEMIA 11/11/2006  . PERIPHERAL VASCULAR DISEASE 11/11/2006  . INSOMNIA, HX OF 11/11/2006    Expected Discharge Date: Expected Discharge Date: 04/29/12  Team Members Present: Physician leading conference: Dr. Faith Rogue Social Worker Present: Amada Jupiter, LCSW Nurse Present: Daryll Brod, RN PT Present: Karolee Stamps, PT;Other (comment) Sherrine Maples, PT) OT Present: Edwin Cap, OT Other (Discipline and Name): Tora Duck, PPS Coordinator     Current Status/Progress Goal Weekly Team Focus  Medical   likely ms, headache after LP, results still pending  improve pain and balance  see prior, family ed, arrange follow upa nd establish plan   Bowel/Bladder   Continent of bowel and bladder. LBM 04/26/12  Pt to remain continent of bowel and bladder  [place]   Swallow/Nutrition/ Hydration             ADL's   steady A with transfers; supervision/steady A overall; occasional LOB  supervision overall  safety awareness, family education    Mobility   supervision with occasional loss of balance, decreased safety awareness  supervision  improved dynamic balance, safety with RW, family education   Communication             Safety/Cognition/ Behavioral Observations  Min A-Supervision  Supervision  organization, working memory, family education   Pain   Scheduled voltaren cream to neck/shoulders qid  <3  Monitor   Skin   CDI  CDI  CDI    Rehab Goals Patient on target to meet rehab goals: Yes *See Interdisciplinary Assessment and Plan and progress notes for long and short-term goals  Barriers to Discharge: balance, safety    Possible Resolutions to Barriers:  safety training, adaptie equipment education    Discharge Planning/Teaching Needs:  Home with daughter who is taking FMLA and can provide 24/7 assistance.      Team Discussion:  Family ed begun today and feel patient on track for 3/27 d/c.  MD aware pt/ daughter requesting info on lab results (for possible MS diagnosis)  No other concerns.  Higher level cognitive issues apparent - recommend follow up HHST.  Revisions to Treatment Plan:  None   Continued Need for Acute Rehabilitation Level of Care: The patient requires daily medical management by a physician with specialized training in physical medicine and rehabilitation for the following conditions: Daily direction of a multidisciplinary physical rehabilitation program to ensure safe treatment while eliciting the highest outcome that is of practical value to the patient.: Yes Daily medical management of patient stability for increased activity during participation in an  intensive rehabilitation regime.: Yes Daily analysis of laboratory values and/or radiology reports with any subsequent need for medication adjustment of medical intervention for : Neurological problems (pain mgt)  Ziyanna Tolin 04/27/2012, 5:09 PM

## 2012-04-28 ENCOUNTER — Inpatient Hospital Stay (HOSPITAL_COMMUNITY): Payer: Medicare Other

## 2012-04-28 ENCOUNTER — Encounter (HOSPITAL_COMMUNITY): Payer: Medicare Other | Admitting: *Deleted

## 2012-04-28 ENCOUNTER — Inpatient Hospital Stay (HOSPITAL_COMMUNITY): Payer: Medicare Other | Admitting: Physical Therapy

## 2012-04-28 ENCOUNTER — Inpatient Hospital Stay (HOSPITAL_COMMUNITY): Payer: Medicare Other | Admitting: Speech Pathology

## 2012-04-28 MED ORDER — ASPIRIN 325 MG PO TABS: 325.0000 mg | ORAL_TABLET | Freq: Every day | ORAL | Status: DC

## 2012-04-28 MED ORDER — DICLOFENAC SODIUM 1 % TD GEL: 2.0000 g | Freq: Four times a day (QID) | TRANSDERMAL | Status: DC

## 2012-04-28 MED ORDER — TRAMADOL HCL 50 MG PO TABS: 50.0000 mg | ORAL_TABLET | Freq: Four times a day (QID) | ORAL | Status: DC | PRN

## 2012-04-28 NOTE — Progress Notes (Signed)
Recreational Therapy Session Note  Patient Details  Name: Michelle Hodges MRN: [id number] Date of Birth: [birth year] Today's Date: 04/28/2012 Time:  1300-1500  Pain: no c/o Skilled Therapeutic Interventions/Progress Updates: Pt participated in community reintegration/outing to [employer] today ambulatory level using RW or shopping cart throughout store ~60 minutes without seated rest break. Outing focused on identification & negotiation of obstacles, accessing public restrooms, and energy conservation techniques. See outing goal sheet in shadow chart for futher details.  Therapy/Group: [employer]   Leslee Haueter 04/28/2012, 5:07 PM

## 2012-04-28 NOTE — Progress Notes (Signed)
Subjective/Complaints: No new complaints. Excited about going home. A 12 point review of systems has been performed and if not noted above is otherwise negative.   Objective: Vital Signs: Blood pressure 117/68, pulse 60, temperature 98.1 F (36.7 C), temperature source Oral, resp. rate 20, weight 62.6 kg (138 lb 0.1 oz), SpO2 100.00%. No results found. No results found for this basename: WBC, HGB, HCT, PLT,  in the last 72 hours No results found for this basename: NA, K, CL, CO, GLUCOSE, BUN, CREATININE, CALCIUM,  in the last 72 hours CBG (last 3)  No results found for this basename: GLUCAP,  in the last 72 hours  Wt Readings from Last 3 Encounters:  04/21/12 62.6 kg (138 lb 0.1 oz)  04/15/12 56.7 kg (125 lb)  04/15/12 56.7 kg (125 lb)    Physical Exam:  Constitutional: She is oriented to person, place, and time.  HENT: dentition fair, mucosa pink and moist  Head: Normocephalic.  Eyes: EOM are normal.  Neck: Neck supple. No thyromegaly present.  Cardiovascular: Normal rate and regular rhythm. No M,R,G  Pulmonary/Chest: Effort normal and breath sounds normal. No respiratory distress.  Abdominal: Bowel sounds are normal. She exhibits no distension.  Musculoskeletal: She exhibits no edema. Neck with minimal pain now. Neurological: She is alert and oriented to person, place, and time.  Patient followed basic commands. She did need some cues for medical history. Very pleasant still sometimes hard to keep on task. Sensation   1+ right leg, arm, face. Mild sensory loss left leg. Tone improved in LE's. No gross visual abnl. Decreased FMC of right arm and leg on exam.  Strength4+ RUE and 4 to 5+ RLE. Skin: Skin is warm and dry   Assessment/Plan: 1. 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 Physiatrist is providing close team supervision and 24 hour management of active  medical problems listed below. Physiatrist and rehab team continue to assess barriers to discharge/monitor patient progress toward functional and medical goals. FIM: FIM - Bathing Bathing Steps Patient Completed: Chest;Right Arm;Left Arm;Abdomen;Front perineal area;Buttocks;Left lower leg (including foot);Right lower leg (including foot);Left upper leg;Right upper leg Bathing: 5: Supervision: Safety issues/verbal cues  FIM - Upper Body Dressing/Undressing Upper body dressing/undressing steps patient completed: Thread/unthread right sleeve of pullover shirt/dresss;Put head through opening of pull over shirt/dress;Thread/unthread left sleeve of pullover shirt/dress;Pull shirt over trunk Upper body dressing/undressing: 5: Set-up assist to: Obtain clothing/put away FIM - Lower Body Dressing/Undressing Lower body dressing/undressing steps patient completed: Thread/unthread right underwear leg;Thread/unthread left underwear leg;Pull underwear up/down;Thread/unthread right pants leg;Don/Doff right sock;Fasten/unfasten pants;Pull pants up/down;Thread/unthread left pants leg;Don/Doff left sock;Don/Doff right shoe;Don/Doff left shoe;Fasten/unfasten right shoe;Fasten/unfasten left shoe Lower body dressing/undressing: 5: Supervision: Safety issues/verbal cues  FIM - Toileting Toileting steps completed by patient: Adjust clothing prior to toileting;Performs perineal hygiene;Adjust clothing after toileting Toileting Assistive Devices: Grab bar or rail for support Toileting: 5: Supervision: Safety issues/verbal cues  FIM - Diplomatic Services operational officer Devices: Art gallery manager Transfers: 5-To toilet/BSC: Supervision (verbal cues/safety issues);5-From toilet/BSC: Supervision (verbal cues/safety issues)  FIM - Banker Devices: Therapist, occupational: 5: Bed > Chair or W/C: Supervision (verbal cues/safety issues);5: Chair or W/C > Bed: Supervision (verbal  cues/safety issues)  FIM - Locomotion: Wheelchair Distance: 100 Locomotion: Wheelchair: 5: Travels 150 ft or more: maneuvers on rugs and over door sills with supervision, cueing or coaxing FIM - Locomotion: Ambulation Locomotion: Ambulation Assistive Devices:  Walker - Rolling Ambulation/Gait Assistance: 4: Min guard Locomotion: Ambulation: 4: Travels 150 ft or more with minimal assistance (Pt.>75%)  Comprehension Comprehension Mode: Auditory Comprehension: 6-Follows complex conversation/direction: With extra time/assistive device  Expression Expression Mode: Verbal Expression: 6-Expresses complex ideas: With extra time/assistive device  Social Interaction Social Interaction: 5-Interacts appropriately 90% of the time - Needs monitoring or encouragement for participation or interaction.  Problem Solving Problem Solving: 5-Solves basic 90% of the time/requires cueing < 10% of the time  Memory Memory: 5-Recognizes or recalls 90% of the time/requires cueing < 10% of the time Medical Problem List and Plan:  1. Demyelinating disorder/MS. LP with increased protein. + Bands in CSF indicative of MS. 2. DVT Prophylaxis/Anticoagulation: SCDs/ambulation 3. Neuropsych: This patient is capable of making decisions on his/her own behalf.  4. Remote lateral cortical and subcortical infarcts. Continue aspirin therapy 5. Tone- ROM, positioning for now. Has shown improvement along with fmc since admit 6. Pain: tylenol, heat, tramadol, voltaren gel for cervicalgia,  -much improved--continue with current plan   LOS (Days) 7 A FACE TO FACE EVALUATION WAS PERFORMED  SWARTZ,ZACHARY T 04/28/2012 8:11 AM

## 2012-04-28 NOTE — Progress Notes (Signed)
Occupational Therapy Discharge Summary  Patient Details  Name: Michelle Hodges MRN: [id number] Date of Birth: 06/20/1945  Today's Date: 04/28/2012  Patient has met 12 of 13 long term goals due to improved activity tolerance, improved balance, postural control, ability to compensate for deficits, improved attention, improved awareness and improved coordination.  Pt has made steady progress with bathing, dressing, simple kitchen tasks, toilet transfers, shower transfers and toileting.  Pt requires occasional verbal cues for organization and safety awareness.  Pt's dtr has been present for therapy and provides appropriate supervision.  Pt will discharge to daughter's home. Patient to discharge at overall Supervision level.  Patient's care partner is independent to provide the necessary supervision assistance at discharge.    Reasons goals not met: Patient did not meet emergent awareness LTG of mod I at this time. Currently, patient requires min verbal cues for safety awareness and organizational tasks.   Recommendation:  Patient will benefit from ongoing skilled OT services in home health setting to continue to advance functional skills in the area of BADL, iADL and Reduce care partner burden.  Equipment: BSC, tub transfer bench  Reasons for discharge: treatment goals met and discharge from hospital  Patient/family agrees with progress made and goals achieved: Yes  Pain Pain Assessment Pain Assessment: No/denies pain  ADL ADL Eating: Independent Where Assessed-Eating: Chair Grooming: Independent Where Assessed-Grooming: Sitting at sink Upper Body Bathing: Supervision/safety Where Assessed-Upper Body Bathing: Shower Lower Body Bathing: Supervision/safety Where Assessed-Lower Body Bathing: Shower Upper Body Dressing: Supervision/safety Where Assessed-Upper Body Dressing: Edge of bed Lower Body Dressing: Supervision/safety Where Assessed-Lower Body Dressing: Edge of bed Toileting:  Supervision/safety Where Assessed-Toileting: Teacher, adult education: Close supervision Toilet Transfer Method: Event organiser: Close supervision Film/video editor Method: Designer, industrial/product: Emergency planning/management officer  Vision/Perception  Vision - History Baseline Vision: Wears glasses all the time Patient Visual Report: Blurring of vision Vision - Assessment Eye Alignment: Within Functional Limits Vision Assessment: Vision not tested Perception Perception: Within Functional Limits Praxis Praxis: Intact   Cognition Overall Cognitive Status: Impaired Arousal/Alertness: Awake/alert Orientation Level: Oriented X4 Attention: Selective Sustained Attention: Appears intact Sustained Attention Impairment: Verbal basic;Functional basic Selective Attention: Impaired Selective Attention Impairment: Verbal basic;Functional basic Memory: Impaired Memory Impairment: Decreased recall of new information;Decreased short term memory Decreased Short Term Memory: Verbal basic;Functional basic Awareness: Impaired Awareness Impairment: Emergent impairment Problem Solving: Impaired Problem Solving Impairment: Verbal basic;Functional basic  Trunk/Postural Assessment  Cervical Assessment Cervical Assessment: Exceptions to [facility] Cervical AROM Overall Cervical AROM Comments: Forward head posture Thoracic Assessment Thoracic Assessment: Within Functional Limits Lumbar Assessment Lumbar Assessment: Within Functional Limits   Balance Static Sitting Balance Static Sitting - Level of Assistance: 7: Independent Dynamic Sitting Balance Dynamic Sitting - Level of Assistance: 7: Independent  Extremity/Trunk Assessment RUE Assessment RUE Assessment: Exceptions to [facility] RUE AROM (degrees) RUE Overall AROM Comments: Patient with decreased AROM throughout RUE, patient with scapula winging -> right scapula. Some ataxic movements in RUE noted RUE Strength RUE Overall  Strength Comments: WFL LUE Assessment LUE Assessment: Within Functional Limits  See FIM for current functional status  Rich Brave 04/28/2012, 9:51 AM

## 2012-04-28 NOTE — Discharge Summary (Signed)
  Discharge summary job # [id number]

## 2012-04-28 NOTE — Progress Notes (Addendum)
Physical Therapy Session Note  Patient Details  Name: Michelle Hodges MRN: [id number] Date of Birth: 26-Jul-1945  Today's Date: 04/28/2012 Time: 1300-1500 Time Calculation (min): 90 min    Skilled Therapeutic Interventions/Progress Updates:   Pt participated in community outing including gait with RW or shopping cart x 60 min, including negotiating aisles, racks, van steps, and public restroom with supervision.  Pt able to remember items she shopped for and identified obstacles.   Therapy Documentation Precautions:  Precautions Precautions: Fall Restrictions Weight Bearing Restrictions: No Pain: Pain Assessment Pain Assessment: No/denies pain See FIM for current functional status  Therapy/Group: Individual Therapy  Georges Mouse 04/28/2012, 3:22 PM

## 2012-04-28 NOTE — Discharge Summary (Signed)
NAMEALZENA, Michelle Hodges              ACCOUNT NO.:  [id number]  MEDICAL RECORD NO.:  [id number]  LOCATION:  4010                         FACILITY:  [facility]  PHYSICIAN:  Michelle Hodges, Michelle HodgesDATE OF BIRTH:  26-Dec-1945  DATE OF ADMISSION:  04/21/2012 DATE OF DISCHARGE:  04/29/2012                              DISCHARGE SUMMARY   DISCHARGE DIAGNOSES: 1. Demyelinating disorder - multiple sclerosis. 2. Sequential compression devices for deep venous thrombosis     prophylaxis. 3. Remote lacunar cortical and subcortical infarcts.  HISTORY OF PRESENT ILLNESS:  This is a 67 year old right-handed female with altered mental status, admitted on April 15, 2012, as well as right lower extremity weakness that originally had acute onset 2 years ago, but was never evaluated as well as recent weight loss of 30 pounds.  MRI of the brain showed no evidence of acute; however, there was a subacute infarct noted, remote bilateral cortical and subcortical infarcts as well as white matter compatible with chronic microvascular ischemia. MRA of the head with no stenosis or occlusion.  Echocardiogram with ejection fraction of 60%.  No wall motion abnormalities.  Carotid Dopplers with no ICA stenosis.  Neurology Service was consulted, questionable multiple sclerosis, placed on intravenous Solu-Medrol x3 days.  Lumbar puncture was completed with results pending.  Consulted to Gastroenterology, Dr. Rhea Belton in reference to history of weight loss.  CT of the abdomen completed, there was a question of sigmoid volvulus. Colonoscopy performed showing no evidence of left mass or volvulus as well as barium contrast enema being unremarkable.  The patient was admitted for comprehensive rehab program.  PAST MEDICAL HISTORY:  See discharge diagnoses.  SOCIAL HISTORY:  Lives with family.  FUNCTIONAL HISTORY PRIOR TO ADMISSION:  She is part-time employed.  FUNCTIONAL STATUS UPON ADMISSION TO REHAB SERVICES:   Minimal-assist ambulate to 175 feet with a 4-wheeled walker.  PHYSICAL EXAMINATION:  VITAL SIGNS:  Blood pressure 137/83, pulse 48, temperature 97, respirations 15. GENERAL:  This was an alert female, in no acute distress, followed basic commands, pleasant, but impulsive, sometimes hard to keep on tasks. HEENT:  Pupils are round and reactive to light. LUNGS:  Clear to auscultation. CARDIAC:  Controlled.  REHABILITATION HOSPITAL COURSE:  The patient was admitted to Inpatient Rehab Services with therapies initiated on a 3-hour daily basis consisting of physical therapy, occupational therapy, speech therapy, and rehabilitation nursing.  The following issues were addressed during the patient's rehabilitation stay.  Pertaining to Michelle Hodges demyelinating disorder - multiple sclerosis, lumbar puncture with noted increase in protein, positive bands in the CSF indicate of multiple sclerosis.  She had completed a course of Solu-Medrol.  Plan was to follow up outpatient office with [facility] in reference to discuss latest lumbar puncture results and ongoing care. Her blood pressures remained well controlled on no antihypertensive medications.  She did remain on aspirin therapy for CT scan showing remote cortical and subcortical infarcts.  She voiced using Ultram on limited basis for pain.  She was continent of bowel and bladder.  The patient received weekly collaborative interdisciplinary team conferences to discuss estimated length of stay, family teaching, and any barriers to discharge.  She was steady assist with  transfers, supervision steady assist overall cage and a loss of balance, mobility supervision with occasional loss of balance again and some decreased in safety awareness. She was able to communicate her needs.  She did need some moderate assist for question in verbal cues for thought organization during a naming categorizing tasks.  The patient also  demonstrated divided attention with a functional conversation while ambulating down the hallway with a rolling walker.  Full family teaching was completed and plan will be discharged to home on April 29, 2012.  DISCHARGE MEDICATIONS: 1. Tylenol as needed. 2. Aspirin 325 mg p.o. daily. 3. Voltaren gel 4 times daily to affected area. 4. Ultram 50 mg every 6 hours as needed pain.  DIET:  Regular.  SPECIAL INSTRUCTIONS:  The patient would follow up with Dr. Faith Hodges, outpatient as needed, Dr. Oliver Hodges, medical management and an appointment to be made with [facility] in reference to demyelinating disorder and discuss latest lumbar puncture results, of which the office was to contact the patient.     Michelle Hodges, P.A.   ______________________________ Michelle Hodges, M.D.    DA/MEDQ  D:  04/28/2012  T:  04/28/2012  Job:  [id number]  cc:   Michelle Levins, MD [facility]

## 2012-04-28 NOTE — Progress Notes (Signed)
Speech Language Pathology Daily Session Note  Patient Details  Name: Michelle Hodges MRN: [id number] Date of Birth: 10-18-1945  Today's Date: 04/28/2012 Time: 0800-0845 Time Calculation (min): 45 min  Short Term Goals: Week 1: SLP Short Term Goal 1 (Week 1): Pt will utilize external memory aids to increase recall/carryover of newly learned information with Min A verbal and visual cues.  SLP Short Term Goal 1 - Progress (Week 1): Progressing toward goal SLP Short Term Goal 2 (Week 1): Pt will demonstrate functional problem solving with basic and familiar task with supervision verbal cues.  SLP Short Term Goal 2 - Progress (Week 1): Progressing toward goal SLP Short Term Goal 3 (Week 1): Pt will decrease tangents during functional conversation with Min question cues.  SLP Short Term Goal 3 - Progress (Week 1): Progressing toward goal SLP Short Term Goal 4 (Week 1): Pt will demonstrate efficient mastication of regular textures with Mod I.   Skilled Therapeutic Interventions: Treatment focus on cognitive goals. SLP facilitated by providing handout to increase recall and utilization of memory compensatory strategies at discharge. Pt verbalized understanding and provided examples of how she will incorporate the memory strategies at home.  Pt also participated in working memory task and required supervision question cues to utilize memory compensatory strategies to increase recall.    FIM:  Comprehension Comprehension Mode: Auditory Comprehension: 6-Follows complex conversation/direction: With extra time/assistive device Expression Expression Mode: Verbal Expression: 6-Expresses complex ideas: With extra time/assistive device Social Interaction Social Interaction: 5-Interacts appropriately 90% of the time - Needs monitoring or encouragement for participation or interaction. Problem Solving Problem Solving: 5-Solves complex 90% of the time/cues < 10% of the time Memory Memory: 5-Recognizes or  recalls 90% of the time/requires cueing < 10% of the time FIM - Eating Eating Activity: 7: Complete independence:no helper  Pain Pain Assessment Pain Assessment: No/denies pain  Therapy/Group: Individual Therapy  Ayodele Sangalang 04/28/2012, 1:25 PM

## 2012-04-28 NOTE — Progress Notes (Signed)
Occupational Therapy Session Note  Patient Details  Name: Michelle Hodges MRN: [id number] Date of Birth: [birth year]  Today's Date: 04/28/2012 Time: 0700-0755 Time Calculation (min): 55 min  Short Term Goals: Week 1:  OT Short Term Goal 1 (Week 1): Short Term Goals = Long Term Goals secondary to ELOS  Skilled Therapeutic Interventions/Progress Updates:    Pt seated EOB upon arrival.  Pt elected to take shower this morning and amb with RW to gather clothing and supplies before amb to bathroom to use toilet and transfer to tub bench in walk in shower.  Pt completed all bathing tasks with sit<>stand from tub bench.  Pt amb to room to complete dressing with sit<>stand from EOB.  Pt completed all tasks with supervision and occasional verbal cues for safety awareness.  Focus on activity tolerance, functional amb with RW for home mgmt, dynamic standing balance, and safety awareness.  Therapy Documentation Precautions:  Precautions Precautions: Fall Restrictions Weight Bearing Restrictions: No Pain: Pain Assessment Pain Assessment: No/denies pain  See FIM for current functional status  Therapy/Group: Individual Therapy  Rich Brave 04/28/2012, 8:00 AM

## 2012-04-28 NOTE — Progress Notes (Signed)
Recreational Therapy Discharge Summary Patient Details  Name: Michelle Hodges MRN: [id number] Date of Birth: 02/08/1945 Today's Date: 04/28/2012  Long term goals set: 1  Long term goals met: 1  Comments on progress toward goals: Pt has made great progress toward goal meeting supervision level for community reintegration activities with exception of stair climbing to load/unload the rehab [name].  Education provided on energy conservation techniques.  Pt states appreciation for the experience and is encouraged to return to community pursuits post discharge.  Reasons for discharge: discharge from hospital  Patient/family agrees with progress made and goals achieved: Yes  [name] 04/28/2012, 5:08 PM

## 2012-04-29 ENCOUNTER — Inpatient Hospital Stay (HOSPITAL_COMMUNITY): Payer: Medicare Other | Admitting: Speech Pathology

## 2012-04-29 NOTE — Progress Notes (Signed)
Speech Language Pathology Session Note & Discharge Summary  Patient Details  Name: Michelle Hodges MRN: [id number] Date of Birth: [birth year]  Today's Date: 04/29/2012 Time: 0865-7846 Time Calculation (min): 15 min  Skilled Therapeutic Intervention: Treatment focus on family education. Pt's daughter re-educated on pt's current cognitive function and strategies to utilize at home to increase safety awareness, problem solving, attention and working memory.Pt's daughter has been present for majority of treatment sessions and provides appropriate cueing. Handouts given. Pt's daughter verbalized understanding of all information.   Patient has met 3 of 3 long term goals.  Patient to discharge at overall Supervision level.   Reasons goals not met: N/A   Clinical Impression/Discharge Summary: Pt has made functional gains and has met 3 of 3 LTG's this admission due to improved attention, awareness, problem solving and working memory with utilization of compensatory strategies. Pt continues to require intermittent verbal and question cues for organization and safety awareness with functional tasks. Pt/family education complete. Pt will discharge to daughter's home with 24 hour supervision. Patient to discharge at an overall Supervision level. Patient's care partner is independent to provide the necessary supervision assistance at discharge. Recommend f/u home health skilled SLP intervention to maximize cognitive function and overall independence.   Care Partner:  Caregiver Able to Provide Assistance: Yes  Type of Caregiver Assistance: Physical;Cognitive  Recommendation:  Home Health SLP;24 hour supervision/assistance  Rationale for SLP Follow Up: Maximize cognitive function and independence;Reduce caregiver burden   Equipment: N/A   Reasons for discharge: Discharged from hospital;Treatment goals met   Patient/Family Agrees with Progress Made and Goals Achieved: Yes   See FIM for current functional  status  Red Mandt 04/29/2012, 8:20 AM  Feliberto Gottron, MA, CCC-SLP [phone]

## 2012-04-29 NOTE — Progress Notes (Signed)
Pt discharged at 1000 with daughter to home. Discharge instructions given by Harvel Ricks, PA with no further questions at this time. Belongings with pt.

## 2012-04-29 NOTE — Progress Notes (Signed)
Subjective/Complaints: No new complaints. A little anxious about going home today. A 12 point review of systems has been performed and if not noted above is otherwise negative.   Objective: Vital Signs: Blood pressure 117/73, pulse 63, temperature 98.3 F (36.8 C), temperature source Oral, resp. rate 18, weight 59.376 kg (130 lb 14.4 oz), SpO2 100.00%. No results found. No results found for this basename: WBC, HGB, HCT, PLT,  in the last 72 hours No results found for this basename: NA, K, CL, CO, GLUCOSE, BUN, CREATININE, CALCIUM,  in the last 72 hours CBG (last 3)  No results found for this basename: GLUCAP,  in the last 72 hours  Wt Readings from Last 3 Encounters:  04/28/12 59.376 kg (130 lb 14.4 oz)  04/15/12 56.7 kg (125 lb)  04/15/12 56.7 kg (125 lb)    Physical Exam:  Constitutional: She is oriented to person, place, and time.  HENT: dentition fair, mucosa pink and moist  Head: Normocephalic.  Eyes: EOM are normal.  Neck: Neck supple. No thyromegaly present.  Cardiovascular: Normal rate and regular rhythm. No M,R,G  Pulmonary/Chest: Effort normal and breath sounds normal. No respiratory distress.  Abdominal: Bowel sounds are normal. She exhibits no distension.  Musculoskeletal: She exhibits no edema. Neck with minimal pain now. Neurological: She is alert and oriented to person, place, and time.  Patient followed basic commands. She did need some cues for medical history. Very pleasant still sometimes hard to keep on task. Sensation   1+ right leg, arm, face. Mild sensory loss left leg. Tone improved in LE's. No gross visual abnl. Decreased FMC of right arm and leg on exam.  Strength4+ RUE and 4 to 5+ RLE. Skin: Skin is warm and dry   Assessment/Plan: 1. 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 Physiatrist is providing close team supervision and 24 hour  management of active medical problems listed below. Physiatrist and rehab team continue to assess barriers to discharge/monitor patient progress toward functional and medical goals. Dc home today. Follow up arranged. FIM: FIM - Bathing Bathing Steps Patient Completed: Chest;Right Arm;Left Arm;Abdomen;Front perineal area;Buttocks;Left lower leg (including foot);Right lower leg (including foot);Left upper leg;Right upper leg Bathing: 5: Supervision: Safety issues/verbal cues  FIM - Upper Body Dressing/Undressing Upper body dressing/undressing steps patient completed: Thread/unthread right sleeve of pullover shirt/dresss;Put head through opening of pull over shirt/dress;Thread/unthread left sleeve of pullover shirt/dress;Pull shirt over trunk Upper body dressing/undressing: 5: Set-up assist to: Obtain clothing/put away FIM - Lower Body Dressing/Undressing Lower body dressing/undressing steps patient completed: Thread/unthread right underwear leg;Thread/unthread left underwear leg;Pull underwear up/down;Thread/unthread right pants leg;Don/Doff right sock;Fasten/unfasten pants;Pull pants up/down;Thread/unthread left pants leg;Don/Doff left sock;Don/Doff right shoe;Don/Doff left shoe;Fasten/unfasten right shoe;Fasten/unfasten left shoe Lower body dressing/undressing: 5: Supervision: Safety issues/verbal cues  FIM - Toileting Toileting steps completed by patient: Adjust clothing prior to toileting;Performs perineal hygiene;Adjust clothing after toileting Toileting Assistive Devices: Grab bar or rail for support Toileting: 5: Supervision: Safety issues/verbal cues  FIM - Diplomatic Services operational officer Devices: Best boy Transfers: 6-Assistive device: No helper  FIM - Banker Devices: Therapist, occupational: 5: Bed > Chair or W/C: Supervision (verbal cues/safety issues);5: Chair or W/C > Bed: Supervision (verbal cues/safety  issues)  FIM - Locomotion: Wheelchair Distance: 100 Locomotion: Wheelchair: 5: Travels 150 ft or more: maneuvers on rugs and over door sills with supervision, cueing or coaxing FIM - Locomotion: Ambulation Locomotion:  Ambulation Assistive Devices: Walker - Rolling Ambulation/Gait Assistance: 5: Supervision Locomotion: Ambulation: 6: Travels 150 ft or more with assistive device/no helper  Comprehension Comprehension Mode: Auditory Comprehension: 6-Follows complex conversation/direction: With extra time/assistive device  Expression Expression Mode: Verbal Expression: 6-Expresses complex ideas: With extra time/assistive device  Social Interaction Social Interaction: 6-Interacts appropriately with others with medication or extra time (anti-anxiety, antidepressant).  Problem Solving Problem Solving: 5-Solves complex 90% of the time/cues < 10% of the time  Memory Memory: 5-Requires cues to use assistive device Medical Problem List and Plan:  1. Demyelinating disorder/MS. LP with increased protein. + Bands in CSF indicative of MS.  -outpt neuro follow up  -i will also see in office. 2. DVT Prophylaxis/Anticoagulation: SCDs/ambulation 3. Neuropsych: This patient is capable of making decisions on his/her own behalf.  4. Remote lateral cortical and subcortical infarcts. Continue aspirin therapy 5. Tone- ROM, positioning for now. Has shown improvement along with fmc since admit 6. Pain: tylenol, heat, tramadol, voltaren gel for cervicalgia,  -much improved--continue with current plan   LOS (Days) 8 A FACE TO FACE EVALUATION WAS PERFORMED  Taggert Bozzi T 04/29/2012 8:18 AM

## 2012-04-29 NOTE — Progress Notes (Signed)
Physical Therapy Discharge Summary  Patient Details  Name: Michelle Hodges MRN: [id number] Date of Birth: February 20, 1945  Today's Date: 04/28/2012    Patient has met 12 of 12 long term goals due to improved activity tolerance, improved balance, increased strength, ability to compensate for deficits, improved attention and improved awareness.  Patient has made excellent progress and is to discharge at an ambulatory level with Supervision provided by pt's daughters who have been present throughout therapy and are very supportive.  Patient's care partner is independent to provide the necessary physical and cognitive assistance at discharge.  Reasons goals not met: NA  Recommendation:  Patient will benefit from ongoing skilled PT services in home health setting progressing to outpatient to continue to advance safe functional mobility, address ongoing impairments in decreased safety awareness, impaired balance, reliance on assistive device, and minimize fall risk. Pt will also benefit from a community fitness program/walking program.  Equipment: RW, wheelchair  Reasons for discharge: treatment goals met and discharge from hospital  Patient/family agrees with progress made and goals achieved: Yes  PT Discharge Precautions/Restrictions  Fall  Cognition Overall Cognitive Status: Impaired Arousal/Alertness: Awake/alert Orientation Level: Oriented X4 Sustained Attention: Appears intact Selective Attention: Impaired Selective Attention Impairment: Functional complex;Verbal complex Memory Impairment: Decreased recall of new information;Decreased short term memory Decreased Short Term Memory: Verbal complex;Functional complex Awareness: Impaired Awareness Impairment: Emergent impairment Problem Solving: Impaired Problem Solving Impairment: Verbal complex;Functional complex Executive Function: Landscape architect: Impaired Organizing Impairment: Verbal complex;Functional  complex Safety/Judgment: Appears intact Sensation Sensation Light Touch: Impaired Detail Light Touch Impaired Details: Impaired RLE;Impaired LLE Proprioception: Impaired Detail Proprioception Impaired Details: Impaired LLE;Impaired RLE Additional Comments: Continues with some numbness and tingling in bilateral feet below ankle Coordination Gross Motor Movements are Fluid and Coordinated: Yes Fine Motor Movements are Fluid and Coordinated: Yes   Mobility Bed Mobility Supine to Sit: 6: Modified independent (Device/Increase time) Sitting - Scoot to Edge of Bed: 6: Modified independent (Device/Increase time) Transfers Sit to Stand: 5: Supervision Sit to Stand Details (indicate cue type and reason): Somtimes pt still needs cues for safe UE placement. Stand to Sit: 5: Supervision Locomotion  Ambulation Ambulation/Gait Assistance: 5: Supervision Ambulation Distance (Feet): 200 Feet Assistive device: Rolling walker Ambulation/Gait Assistance Details: Cues for foot placement, posture, small steps with turning Gait Gait: Yes Gait Pattern: Step-through pattern;Decreased stride length;Right foot flat;Left foot flat;Narrow base of support Stairs / Additional Locomotion Stairs: Yes Stairs Assistance: 5: Supervision Stair Management Technique: One rail Right;Step to pattern;Sideways Number of Stairs: 8 Wheelchair Mobility Wheelchair Assistance: 5: Supervision Wheelchair Parts Management: Supervision/cueing Distance: 150'  Trunk/Postural Assessment  Cervical Assessment Cervical Assessment: Exceptions to [facility] Cervical AROM Overall Cervical AROM Comments: Forward head posture Thoracic Assessment Thoracic Assessment: Within Functional Limits Lumbar Assessment Lumbar Assessment: Within Functional Limits  Balance Static Sitting Balance Static Sitting - Level of Assistance: 7: Independent Dynamic Sitting Balance Dynamic Sitting - Level of Assistance: 7: Independent Static Standing  Balance Static Standing - Level of Assistance: 5: Stand by assistance Dynamic Standing Balance Dynamic Standing - Balance Support: Bilateral upper extremity supported Dynamic Standing - Level of Assistance: 5: Stand by assistance Dynamic Standing - Comments: Continues to have decreased balance with distraction Extremity Assessment      RLE Assessment RLE Assessment: Exceptions to [facility] RLE Strength RLE Overall Strength Comments: Generalized weakness, grossly 3+/5 LLE Assessment LLE Assessment: Exceptions to [facility] LLE Strength LLE Overall Strength Comments: Generalized weakness,grossly  3+/5  See FIM for current functional status  Wilhemina Bonito  04/29/2012, 12:26 PM

## 2012-04-29 NOTE — Progress Notes (Signed)
Social Work  Discharge Note  The overall goal for the admission was met for:   Discharge location: Yes - home with daughter initially  Length of Stay: Yes - 8 days  Discharge activity level: Yes - supervision overall  Home/community participation: Yes  Services provided included: MD, RD, PT, OT, SLP, RN, TR, Pharmacy and SW  Financial Services: Medicare and Medicaid  Follow-up services arranged: Home Health: PT, ST via [facility], DME: [phone] Breezy w/c, cushion, 3n1 commode and tub bench via [facility] and Patient/Family has no preference for HH/DME agencies  Comments (or additional information):  Patient/Family verbalized understanding of follow-up arrangements: Yes  Individual responsible for coordination of the follow-up plan: patient (with daughter's assist as needed)  Confirmed correct DME delivered: Dandy Lazaro 04/29/2012    Jahmal Dunavant

## 2012-05-03 ENCOUNTER — Telehealth: Payer: Self-pay

## 2012-05-03 NOTE — Telephone Encounter (Signed)
Left message giving verbal to do occupational therapy.

## 2012-05-03 NOTE — Telephone Encounter (Signed)
Marcelino Duster with [facility] recommends occupational therapy.  Please call with verbal order.

## 2012-05-14 ENCOUNTER — Ambulatory Visit (INDEPENDENT_AMBULATORY_CARE_PROVIDER_SITE_OTHER): Payer: Medicare Other | Admitting: Diagnostic Neuroimaging

## 2012-05-14 ENCOUNTER — Encounter: Payer: Self-pay | Admitting: Diagnostic Neuroimaging

## 2012-05-14 ENCOUNTER — Telehealth: Payer: Self-pay

## 2012-05-14 VITALS — BP 127/86 | HR 63 | Temp 98.1°F | Ht 69.0 in | Wt 134.0 lb

## 2012-05-14 DIAGNOSIS — R6889 Other general symptoms and signs: Secondary | ICD-10-CM

## 2012-05-14 DIAGNOSIS — G35 Multiple sclerosis: Secondary | ICD-10-CM

## 2012-05-14 DIAGNOSIS — E559 Vitamin D deficiency, unspecified: Secondary | ICD-10-CM

## 2012-05-14 NOTE — Patient Instructions (Signed)
I will check labs. Consider tysabri vs. gilenya for MS.

## 2012-05-14 NOTE — Telephone Encounter (Signed)
Marcelino Duster with [facility] called to get verbal orders to have a Child psychotherapist consult.  Verbal ok given.

## 2012-05-14 NOTE — Progress Notes (Signed)
[facility]  PATIENT: Michelle Hodges DOB: 27-Oct-1945  REFERRING CLINICIAN: hospital HISTORY FROM: patient and daughter REASON FOR VISIT: new consult   HISTORICAL  CHIEF COMPLAINT:  Chief Complaint  Patient presents with  . Neurologic Problem    ER, stroke or MS    HISTORY OF PRESENT ILLNESS:   67 year old right-handed female here for evaluation of possible multiple sclerosis.  Patient was admitted to the hospital in March 2014 for right-sided weakness and possible stroke. MRI of the brain showed no acute stroke. MRI cervical spine showed multiple spinal cord lesions. Patient had lumbar puncture and other blood testing. Patient was given diagnosis of possible multiple sclerosis, treated with IV steroids, transferred to inpatient rehabilitation, and then sent home.  In retrospect patient has had at least 5-10 year history of balance and gait difficulty with intermittent falls. She has had intermittent episodes of double vision, numbness in her hands and feet, worsening memory. Some symptoms date back to 1995. Unfortunately patient did not seek medical attention for these issues over the years.  Since discharge patient has been stable. She still has significant gait difficulty and uses a single-point cane for ambulation. She's not back to work.  REVIEW OF SYSTEMS: Full 14 system review of systems performed and notable only for fatigue palpitation ringing in ears spinning sensation difficulty swallowing mucus in stools blurred vision eye pain feeling cold joint pain memory loss confusion headache numbness or speech difficulty swallowing dizziness and sleepiness restless legs.  ALLERGIES: No Known Allergies  HOME MEDICATIONS: Outpatient Prescriptions Prior to Visit  Medication Sig Dispense Refill  . aspirin 325 MG tablet Take 1 tablet (325 mg total) by mouth daily.      . diclofenac sodium (VOLTAREN) 1 % GEL Apply 2 g topically 4 (four) times daily.  1 Tube  0    . traMADol (ULTRAM) 50 MG tablet Take 1 tablet (50 mg total) by mouth every 6 (six) hours as needed.  60 tablet  0   No facility-administered medications prior to visit.    PAST MEDICAL HISTORY: Past Medical History  Diagnosis Date  . History of blood transfusion   . Hypertension     PAST SURGICAL HISTORY: Past Surgical History  Procedure Laterality Date  . Tonsillectomy    . Colonoscopy N/A 04/20/2012    Procedure: COLONOSCOPY;  Surgeon: Beverley Fiedler, MD;  Location: [facility] ENDOSCOPY;  Service: Gastroenterology;  Laterality: N/A;  . Cystectomy      in HS    FAMILY HISTORY: Family History  Problem Relation Age of Onset  . Stroke Father   . Stroke Sister   . Cancer - Other Maternal Grandmother     stomach  . Multiple sclerosis Cousin     SOCIAL HISTORY:  History   Social History  . Marital Status: Divorced    Spouse Name: N/A    Number of Children: 2  . Years of Education: B.S.   Occupational History  .  [employer], part-time   Social History Main Topics  . Smoking status: Former Smoker    Quit date: 02/04/1984  . Smokeless tobacco: Not on file  . Alcohol Use: Yes     Comment: Wine Occassionally  . Drug Use: No     Comment: "I'm around it", quit using 04/05/2012  . Sexually Active: Not on file   Other Topics Concern  . Not on file   Social History Narrative   Pt lives at home with  her daughter.   Caffeine Use- She does drink coffee.     PHYSICAL EXAM  Filed Vitals:   05/14/12 1014  BP: 127/86  Pulse: 63  Temp: 98.1 F (36.7 C)  TempSrc: Oral  Height: 5\' 9"  (1.753 m)  Weight: 134 lb (60.782 kg)   Body mass index is 19.78 kg/(m^2).  GENERAL EXAM: Patient is in no distress  CARDIOVASCULAR: Regular rate and rhythm, no murmurs, no carotid bruits  NEUROLOGIC: MENTAL STATUS: awake, alert, language fluent, comprehension intact, naming intact CRANIAL NERVE: no papilledema on fundoscopic exam, pupils equal and reactive to  light, visual fields full to confrontation, extraocular muscles intact, no nystagmus, facial sensation and strength symmetric, uvula midline, shoulder shrug symmetric, tongue midline. MOTOR: normal bulk and tone, full strength in the BUE, BLE SENSORY: normal and symmetric to light touch, pinprick, temperature, vibration and proprioception COORDINATION: finger-nose-finger, fine finger movements normal REFLEXES: deep tendon reflexes present and symmetric GAIT/STATION: narrow based gait; able to walk on toes, heels and tandem; romberg is negative   DIAGNOSTIC DATA (LABS, IMAGING, TESTING) - I reviewed patient records, labs, notes, testing and imaging myself where available.  Lab Results  Component Value Date   WBC 7.4 04/22/2012   HGB 11.6* 04/22/2012   HCT 34.3* 04/22/2012   MCV 90.3 04/22/2012   PLT 200 04/22/2012      Component Value Date/Time   NA 145 04/22/2012 0643   K 3.9 04/22/2012 0643   CL 106 04/22/2012 0643   CO2 32 04/22/2012 0643   GLUCOSE 88 04/22/2012 0643   GLUCOSE 97 11/18/2005 1113   BUN 10 04/22/2012 0643   CREATININE 0.90 04/22/2012 0643   CALCIUM 9.0 04/22/2012 0643   PROT 5.8* 04/22/2012 0643   ALBUMIN 2.9* 04/22/2012 0643   AST 15 04/22/2012 0643   ALT 24 04/22/2012 0643   ALKPHOS 39 04/22/2012 0643   BILITOT 0.7 04/22/2012 0643   GFRNONAA 66* 04/22/2012 0643   GFRAA 76* 04/22/2012 0643   Lab Results  Component Value Date   CHOL 154 04/16/2012   HDL 56 04/16/2012   LDLCALC 84 04/16/2012   TRIG 71 04/16/2012   CHOLHDL 2.8 04/16/2012   Lab Results  Component Value Date   HGBA1C 5.6 04/16/2012   Lab Results  Component Value Date   VITAMINB12 493 04/15/2012   Lab Results  Component Value Date   TSH 3.985 04/15/2012    CSF studies (04/19/12): OCB: >5 well defined gamma restriction bands that are not present in the patient's corresponding serum sample MBP < 2 WBC 4, RBC 990, Glucose 88, Protein 24 Cultures negative  Labs: UDS - positive THC  MRI brain - multiple  periventricular and subcortical T2 hyperintense lesions  MRI cervical spine - T2 hyperintense spinal cord lesions at C3, C4, T2 on the right, and C5 on the left  ASSESSMENT AND PLAN  67 y.o. year old female  has a past medical history of History of blood transfusion and Hypertension. here with suspected multiple sclerosis. I will check some additional blood testing to look for mimicking conditions. I will check her JC virus antibody status to determine whether she would be a candidate for Tysabri. If she is positive for JC virus antibody, and we may consider Gilenya versus PREFER MS study.   Orders Placed This Encounter  Procedures  . Stratify JCV Antibody Test ([facility])  . ANCA Screen Reflex Titer  . ANA w/Reflex if Positive  . Angiotensin converting enzyme  . Neuromyelitis optica autoab, IgG  I reviewed extensive hospital records, imaging, lab test, discussed with patient and her daughter, reviewed diagnosis, plan, treatment options with patient. Extensive face-to-face time. High complexity medical decision-making.  Suanne Marker, MD 05/14/2012, 3:18 PM Certified in Neurology, Neurophysiology and Neuroimaging  [facility] [address] [place], [place] [address] [phone]

## 2012-05-18 LAB — ANGIOTENSIN CONVERTING ENZYME: Angio Convert Enzyme: 51 U/L (ref 14–82)

## 2012-05-18 LAB — PAN-ANCA: P-ANCA: <1:20 {titer}

## 2012-05-18 LAB — NMO IGG AUTOANTIBODIES: NMO-IgG: <1.6 U/mL (ref 0.0–3.0)

## 2012-05-18 LAB — ANA W/REFLEX IF POSITIVE: Anti Nuclear Antibody(ANA): NEGATIVE

## 2012-05-24 ENCOUNTER — Telehealth: Payer: Self-pay | Admitting: *Deleted

## 2012-05-24 NOTE — Telephone Encounter (Signed)
Requesting verbal order to extend visits 2w3  Verbal ok given.

## 2012-05-26 ENCOUNTER — Encounter: Payer: Self-pay | Admitting: Diagnostic Neuroimaging

## 2012-05-26 NOTE — Telephone Encounter (Signed)
Message copied by Guy Begin on Wed May 26, 2012  9:33 AM ------      Message from: Wallace Going B      Created: Mon May 24, 2012  3:37 PM      Contact: [phone]       Pt's daughter Beverlee Nims needs to make f/u appt for pt. in July--stated could not make appt @ check out @ would have clinic assistant call--never received call--[phone](C-Allison) ------

## 2012-05-28 NOTE — Telephone Encounter (Signed)
Gave to front desk, per L. Sanborn to have pt scheduled.  This encounter was created in error - please disregard.

## 2012-05-31 ENCOUNTER — Encounter: Payer: Medicare Other | Attending: Physical Medicine & Rehabilitation | Admitting: Physical Medicine & Rehabilitation

## 2012-05-31 ENCOUNTER — Encounter: Payer: Self-pay | Admitting: Physical Medicine & Rehabilitation

## 2012-05-31 VITALS — BP 118/82 | HR 67 | Resp 16 | Ht 69.0 in | Wt 127.0 lb

## 2012-05-31 DIAGNOSIS — R51 Headache: Secondary | ICD-10-CM | POA: Insufficient documentation

## 2012-05-31 DIAGNOSIS — R269 Unspecified abnormalities of gait and mobility: Secondary | ICD-10-CM | POA: Insufficient documentation

## 2012-05-31 DIAGNOSIS — K029 Dental caries, unspecified: Secondary | ICD-10-CM | POA: Insufficient documentation

## 2012-05-31 DIAGNOSIS — G35 Multiple sclerosis: Secondary | ICD-10-CM | POA: Insufficient documentation

## 2012-05-31 DIAGNOSIS — R519 Headache, unspecified: Secondary | ICD-10-CM | POA: Insufficient documentation

## 2012-05-31 DIAGNOSIS — R29898 Other symptoms and signs involving the musculoskeletal system: Secondary | ICD-10-CM | POA: Insufficient documentation

## 2012-05-31 DIAGNOSIS — R279 Unspecified lack of coordination: Secondary | ICD-10-CM | POA: Insufficient documentation

## 2012-05-31 DIAGNOSIS — R209 Unspecified disturbances of skin sensation: Secondary | ICD-10-CM | POA: Insufficient documentation

## 2012-05-31 MED ORDER — TRAMADOL HCL 50 MG PO TABS: 50.0000 mg | ORAL_TABLET | Freq: Four times a day (QID) | ORAL | Status: DC | PRN

## 2012-05-31 NOTE — Progress Notes (Signed)
Subjective:    Patient ID: Michelle Hodges, female    DOB: 10-09-1945, 67 y.o.   MRN: [id number]  HPI  Michelle Hodges is about her gait disorder. She is walking with her cane. She hasn't had any falls but has had a near miss or two. She still feels that there is some numbness and heaviness on the right arm and legs. She is having intermittent headaches predominantly on the left temporal area 4/10 in intensity. She generally uses tramadol for pain  She is having a dental procedure for a cavity in one of her left upper teeth.   She is involved in all three therapies with speech being added most recently. She still has some memory difficulties for day to day info. Daughter would like to transition back to work.  Pain Inventory Average Pain 6 Pain Right Now 0 My pain is sharp and tingling  In the last 24 hours, has pain interfered with the following? General activity 5 Relation with others 0 Enjoyment of life 0 What TIME of day is your pain at its worst? daytime Sleep (in general) n/a  Pain is worse with: unsure Pain improves with: medication Relief from Meds: 7  Mobility walk with assistance use a cane how many minutes can you walk? 15-20 min ability to climb steps?  yes do you drive?  no needs help with transfers  Function not employed: date last employed 04-15-12  Neuro/Psych bladder control problems bowel control problems numbness tingling trouble walking spasms dizziness confusion  Prior Studies Any changes since last visit?  yes x-rays CT/MRI  Physicians involved in your care Any changes since last visit?  yes Neurologist Dr Thad Ranger   Family History  Problem Relation Age of Onset  . Stroke Father   . Stroke Sister   . Cancer - Other Maternal Grandmother     stomach  . Multiple sclerosis Cousin    History   Social History  . Marital Status: Divorced    Spouse Name: N/A    Number of Children: 2  . Years of Education: B.S.   Occupational History   .  [employer], part-time   Social History Main Topics  . Smoking status: Former Smoker    Quit date: 02/04/1984  . Smokeless tobacco: None  . Alcohol Use: Yes     Comment: Wine Occassionally  . Drug Use: No     Comment: "I'm around it", quit using 04/05/2012  . Sexually Active: None   Other Topics Concern  . None   Social History Narrative   Pt lives at home with her daughter.   Caffeine Use- She does drink coffee.   Past Surgical History  Procedure Laterality Date  . Tonsillectomy    . Colonoscopy N/A 04/20/2012    Procedure: COLONOSCOPY;  Surgeon: Beverley Fiedler, MD;  Location: [facility] ENDOSCOPY;  Service: Gastroenterology;  Laterality: N/A;  . Cystectomy      in HS   Past Medical History  Diagnosis Date  . History of blood transfusion   . Hypertension    BP 118/82  Pulse 67  Resp 16  Ht 5\' 9"  (1.753 m)  Wt 127 lb (57.607 kg)  BMI 18.75 kg/m2  SpO2 93%      Review of Systems  Musculoskeletal: Positive for gait problem.  Neurological: Positive for numbness.  Psychiatric/Behavioral: Positive for confusion.  All other systems reviewed and are negative.       Objective:   Physical Exam  General: Alert and oriented x 3, No apparent distress HEENT: Head is normocephalic, atraumatic, PERRLA, EOMI, sclera anicteric, oral mucosa pink and moist, dentition intact, ext ear canals clear,  Neck: Supple without JVD or lymphadenopathy Heart: Reg rate and rhythm. No murmurs rubs or gallops Chest: CTA bilaterally without wheezes, rales, or rhonchi; no distress Abdomen: Soft, non-tender, non-distended, bowel sounds positive. Extremities: No clubbing, cyanosis, or edema. Pulses are 2+ Skin: Clean and intact without signs of breakdown Neuro: Pt is cognitively appropriate with normal insight, memory, and awareness. Cranial nerves 2-12 are intact. Sensory exam is normal essentially on the left and 1+ on the right with decreased proprioception noted. . Reflexes  are 2+ in all 4's. Fine motor coordination is intact. No tremors. Motor function is grossly 5/5 left, 4/5 RUE and 3+ to 4/5 RLE. She ambulated for me and was unsteady on her feet. Tended to lean to the right. The right knee buckled once during gait while using her cane. Short term memory was dimished. She was able to carry a coherent conversation.  Musculoskeletal: Full ROM, No pain with AROM or PROM in the neck, trunk, or extremities. Posture appropriate Psych: Pt's affect is appropriate. Pt is cooperative         Assessment & Plan:  1. 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  2. Headaches--likely related to dental caries    Plan:  1. Continue with home health therapies with progression to outpatient.  2. Think she needs a walker or at least a quad cane if she is to walk on her own at home, especially if her daughter is planning on going back to work.  3. Tramadol for headaches was refilled today.  4. Follow up with me in 2 months. She needs follow up with neurology as well over the next couple months.  5. Reviewed remedies for diarrhea as well including daily fiber and probiotic. 30 minutes of face to face patient care time were spent during this visit. All questions were encouraged and answered.

## 2012-05-31 NOTE — Patient Instructions (Signed)
1. PROBIOTIC AS DIRECTED (CAPSULE IN [facility]) 2. DAILY FIBER

## 2012-06-08 ENCOUNTER — Telehealth: Payer: Self-pay | Admitting: Diagnostic Neuroimaging

## 2012-06-14 NOTE — Telephone Encounter (Signed)
done

## 2012-06-22 ENCOUNTER — Telehealth: Payer: Self-pay

## 2012-06-22 DIAGNOSIS — G35 Multiple sclerosis: Secondary | ICD-10-CM

## 2012-06-22 NOTE — Telephone Encounter (Signed)
Michelle Hodges with [facility] called to get outpatient therapy orders for physical therapy and speech therapy.  Orders placed [facility] aware.

## 2012-06-25 ENCOUNTER — Ambulatory Visit: Payer: Medicare Other | Attending: Physical Medicine & Rehabilitation | Admitting: *Deleted

## 2012-06-25 DIAGNOSIS — Z5189 Encounter for other specified aftercare: Secondary | ICD-10-CM | POA: Insufficient documentation

## 2012-06-25 DIAGNOSIS — R269 Unspecified abnormalities of gait and mobility: Secondary | ICD-10-CM | POA: Insufficient documentation

## 2012-06-25 DIAGNOSIS — M6281 Muscle weakness (generalized): Secondary | ICD-10-CM | POA: Insufficient documentation

## 2012-06-25 DIAGNOSIS — R41841 Cognitive communication deficit: Secondary | ICD-10-CM | POA: Insufficient documentation

## 2012-06-30 ENCOUNTER — Ambulatory Visit: Payer: Medicare Other | Admitting: Physical Therapy

## 2012-07-02 ENCOUNTER — Ambulatory Visit: Payer: Medicare Other | Admitting: Physical Therapy

## 2012-07-02 ENCOUNTER — Ambulatory Visit: Payer: Medicare Other

## 2012-07-05 ENCOUNTER — Ambulatory Visit: Payer: Medicare Other | Attending: Physical Medicine & Rehabilitation | Admitting: Physical Therapy

## 2012-07-05 DIAGNOSIS — R41841 Cognitive communication deficit: Secondary | ICD-10-CM | POA: Insufficient documentation

## 2012-07-05 DIAGNOSIS — Z5189 Encounter for other specified aftercare: Secondary | ICD-10-CM | POA: Insufficient documentation

## 2012-07-05 DIAGNOSIS — R269 Unspecified abnormalities of gait and mobility: Secondary | ICD-10-CM | POA: Insufficient documentation

## 2012-07-05 DIAGNOSIS — M6281 Muscle weakness (generalized): Secondary | ICD-10-CM | POA: Insufficient documentation

## 2012-07-07 ENCOUNTER — Ambulatory Visit: Payer: Medicare Other

## 2012-07-07 ENCOUNTER — Ambulatory Visit: Payer: Medicare Other | Admitting: Physical Therapy

## 2012-07-12 ENCOUNTER — Ambulatory Visit: Payer: Medicare Other | Admitting: Physical Therapy

## 2012-07-16 ENCOUNTER — Ambulatory Visit: Payer: Medicare Other

## 2012-07-16 ENCOUNTER — Ambulatory Visit: Payer: Medicare Other | Admitting: Physical Therapy

## 2012-07-20 ENCOUNTER — Ambulatory Visit: Payer: Medicare Other | Admitting: Physical Therapy

## 2012-07-20 ENCOUNTER — Ambulatory Visit: Payer: Medicare Other

## 2012-07-22 ENCOUNTER — Ambulatory Visit: Payer: Medicare Other | Admitting: Physical Therapy

## 2012-07-23 ENCOUNTER — Ambulatory Visit: Payer: Medicare Other | Admitting: Physical Therapy

## 2012-07-23 ENCOUNTER — Ambulatory Visit: Payer: Medicare Other | Admitting: Speech Pathology

## 2012-07-27 ENCOUNTER — Ambulatory Visit: Payer: Medicare Other | Admitting: Physical Therapy

## 2012-07-29 ENCOUNTER — Ambulatory Visit: Payer: Medicare Other | Admitting: Physical Therapy

## 2012-07-30 ENCOUNTER — Encounter: Payer: Medicare Other | Attending: Physical Medicine & Rehabilitation | Admitting: Physical Medicine & Rehabilitation

## 2012-07-30 ENCOUNTER — Encounter: Payer: Self-pay | Admitting: Physical Medicine & Rehabilitation

## 2012-07-30 VITALS — BP 105/67 | HR 62 | Resp 14 | Ht 69.0 in | Wt 128.0 lb

## 2012-07-30 DIAGNOSIS — R279 Unspecified lack of coordination: Secondary | ICD-10-CM | POA: Insufficient documentation

## 2012-07-30 DIAGNOSIS — R29898 Other symptoms and signs involving the musculoskeletal system: Secondary | ICD-10-CM | POA: Insufficient documentation

## 2012-07-30 DIAGNOSIS — R269 Unspecified abnormalities of gait and mobility: Secondary | ICD-10-CM | POA: Insufficient documentation

## 2012-07-30 DIAGNOSIS — K029 Dental caries, unspecified: Secondary | ICD-10-CM | POA: Insufficient documentation

## 2012-07-30 DIAGNOSIS — G35 Multiple sclerosis: Secondary | ICD-10-CM | POA: Insufficient documentation

## 2012-07-30 DIAGNOSIS — R51 Headache: Secondary | ICD-10-CM | POA: Insufficient documentation

## 2012-07-30 DIAGNOSIS — R209 Unspecified disturbances of skin sensation: Secondary | ICD-10-CM | POA: Insufficient documentation

## 2012-07-30 MED ORDER — DICLOFENAC SODIUM 1 % TD GEL: 2.0000 g | Freq: Four times a day (QID) | TRANSDERMAL | Status: DC

## 2012-07-30 MED ORDER — TRAMADOL HCL 50 MG PO TABS: 50.0000 mg | ORAL_TABLET | Freq: Four times a day (QID) | ORAL | Status: DC | PRN

## 2012-07-30 NOTE — Patient Instructions (Signed)
WEAR YOUR BRACE WHEN YOU ARE WALKING LONGER DISTANCES OR OUTSIDE THE HOME.

## 2012-07-30 NOTE — Progress Notes (Signed)
Subjective:    Patient ID: Michelle Hodges, female    DOB: 02-20-1945, 67 y.o.   MRN: [id number]  HPI  Mrs. Moree is back regarding her MS.  She is still having some pain in her right foot/knee/hand but it's generally under control. She is wrapping up with PT at the [facility] center. An AFO is pending. shes walking with a straight cane. Her right knee still buckles somewhat with longer dx's.   No changes have taken place in regard to her MS or rx. She is looking for follow up sooner than can be provided locally.   She is using voltaren gel for pain relief as well as an occasional tramadol.   Pain Inventory Average Pain 7 Pain Right Now 7 My pain is intermittent  In the last 24 hours, has pain interfered with the following? General activity 0 Relation with others 0 Enjoyment of life 0 What TIME of day is your pain at its worst? evening and night Sleep (in general) Fair  Pain is worse with: walking and bending Pain improves with: pacing activities Relief from Meds: 6  Mobility use a cane how many minutes can you walk? 10  Function not employed: date last employed 03/14  Neuro/Psych numbness tingling  Prior Studies Any changes since last visit?  no  Physicians involved in your care Any changes since last visit?  no   Family History  Problem Relation Age of Onset  . Stroke Father   . Stroke Sister   . Cancer - Other Maternal Grandmother     stomach  . Multiple sclerosis Cousin    History   Social History  . Marital Status: Divorced    Spouse Name: N/A    Number of Children: 2  . Years of Education: B.S.   Occupational History  .  [employer], part-time   Social History Main Topics  . Smoking status: Former Smoker    Quit date: 02/04/1984  . Smokeless tobacco: None  . Alcohol Use: Yes     Comment: Wine Occassionally  . Drug Use: No     Comment: "I'm around it", quit using 04/05/2012  . Sexually Active: None   Other Topics  Concern  . None   Social History Narrative   Pt lives at home with her daughter.   Caffeine Use- She does drink coffee.   Past Surgical History  Procedure Laterality Date  . Tonsillectomy    . Colonoscopy N/A 04/20/2012    Procedure: COLONOSCOPY;  Surgeon: Beverley Fiedler, MD;  Location[facility]COPY;  Service: Gastroenterology;  Laterality: N/A;  . Cystectomy      in HS   Past Medical History  Diagnosis Date  . History of blood transfusion   . Hypertension    BP 105/67  Pulse 62  Resp 14  Ht 5\' 9"  (1.753 m)  Wt 128 lb (58.06 kg)  BMI 18.89 kg/m2  SpO2 98%     Review of Systems  Neurological: Positive for numbness.  All other systems reviewed and are negative.       Objective:   Physical Exam General: Alert and oriented x 3, No apparent distress  HEENT: Head is normocephalic, atraumatic, PERRLA, EOMI, sclera anicteric, oral mucosa pink and moist, dentition intact, ext ear canals clear,  Neck: Supple without JVD or lymphadenopathy  Heart: Reg rate and rhythm. No murmurs rubs or gallops  Chest: CTA bilaterally without wheezes, rales, or rhonchi; no distress  Abdomen: Soft, non-tender,  non-distended, bowel sounds positive.  Extremities: No clubbing, cyanosis, or edema. Pulses are 2+  Skin: Clean and intact without signs of breakdown  Neuro: Pt is cognitively appropriate with normal insight, memory, and awareness. Cranial nerves 2-12 are intact. Sensory exam is normal essentially on the left and 1+ on the right with decreased proprioception noted. . Reflexes are 2+ in all 4's. Fine motor coordination is intact. No tremors. Motor function is grossly 5/5 left, 4+/5 RUE and 4+/5 RLE. She ambulated with her walker and still drags the right foot occasionally and has difficulty with foot placement without her cane. Short term memory is improved bust sill was dimished. She was able to carry a coherent conversation.  Musculoskeletal: Full ROM, No pain with AROM or PROM in the neck,  trunk, or extremities. Posture appropriate  Psych: Pt's affect is appropriate. Pt is cooperative    Assessment & Plan:   1. 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  2. Headaches--likely related to dental caries    Plan:  1. Continue with outpt PT to completion. She will finish up over the next few weeks. I think she would do well with a maintenance exercise program at the [facility], particularly exercises in the water.  2. Continue with cane for balance when up. I think she could use the AFO for longer dx and when she's out of the house.  3. Tramadol for headaches-refill provided. 4. Follow up with me in 6 months. MS mgt per neurology   5. 30 minutes of face to face patient care time were spent during this visit. All questions were encouraged and answered.

## 2012-08-03 ENCOUNTER — Ambulatory Visit: Payer: Medicare Other

## 2012-08-03 ENCOUNTER — Ambulatory Visit: Payer: Medicare Other | Attending: Physical Medicine & Rehabilitation | Admitting: Physical Therapy

## 2012-08-03 DIAGNOSIS — R269 Unspecified abnormalities of gait and mobility: Secondary | ICD-10-CM | POA: Insufficient documentation

## 2012-08-03 DIAGNOSIS — R41841 Cognitive communication deficit: Secondary | ICD-10-CM | POA: Insufficient documentation

## 2012-08-03 DIAGNOSIS — Z5189 Encounter for other specified aftercare: Secondary | ICD-10-CM | POA: Insufficient documentation

## 2012-08-03 DIAGNOSIS — M6281 Muscle weakness (generalized): Secondary | ICD-10-CM | POA: Insufficient documentation

## 2012-08-05 ENCOUNTER — Ambulatory Visit: Payer: Medicare Other

## 2012-08-05 ENCOUNTER — Ambulatory Visit: Payer: Medicare Other | Admitting: Physical Therapy

## 2012-08-10 ENCOUNTER — Ambulatory Visit: Payer: Medicare Other | Admitting: Physical Therapy

## 2012-08-10 ENCOUNTER — Ambulatory Visit: Payer: Medicare Other

## 2012-08-12 ENCOUNTER — Ambulatory Visit: Payer: Medicare Other | Admitting: Physical Therapy

## 2012-08-18 ENCOUNTER — Ambulatory Visit: Payer: Medicare Other | Admitting: Physical Therapy

## 2012-08-18 ENCOUNTER — Ambulatory Visit: Payer: Medicare Other | Admitting: Speech Pathology

## 2012-08-19 ENCOUNTER — Ambulatory Visit: Payer: Medicare Other | Admitting: Physical Therapy

## 2012-08-20 ENCOUNTER — Ambulatory Visit: Payer: Medicare Other

## 2012-08-20 ENCOUNTER — Ambulatory Visit: Payer: Medicare Other | Admitting: Physical Therapy

## 2012-08-27 DIAGNOSIS — Z0289 Encounter for other administrative examinations: Secondary | ICD-10-CM

## 2012-10-21 ENCOUNTER — Telehealth: Payer: Self-pay

## 2012-10-21 MED ORDER — TRAMADOL HCL 50 MG PO TABS: 50.0000 mg | ORAL_TABLET | Freq: Four times a day (QID) | ORAL | Status: DC | PRN

## 2012-10-21 NOTE — Telephone Encounter (Signed)
Patient is out of town and out of her tramadol.  She request it be called into [facility] in [place] [phone].  I called this in.  Also reminded patient she needed to follow up.

## 2012-12-16 ENCOUNTER — Other Ambulatory Visit: Payer: Self-pay

## 2012-12-16 DIAGNOSIS — Z1231 Encounter for screening mammogram for malignant neoplasm of breast: Secondary | ICD-10-CM

## 2013-01-24 ENCOUNTER — Ambulatory Visit
Admission: RE | Admit: 2013-01-24 | Discharge: 2013-01-24 | Disposition: A | Discharge status: Home / Self Care | Payer: Medicare Other | Source: Ambulatory Visit

## 2013-01-24 ENCOUNTER — Encounter: Payer: Medicare Other | Attending: Physical Medicine & Rehabilitation | Admitting: Physical Medicine & Rehabilitation

## 2013-01-24 ENCOUNTER — Encounter: Payer: Self-pay | Admitting: Physical Medicine & Rehabilitation

## 2013-01-24 VITALS — BP 118/74 | HR 78 | Resp 14 | Ht 69.0 in | Wt 135.0 lb

## 2013-01-24 DIAGNOSIS — K029 Dental caries, unspecified: Secondary | ICD-10-CM | POA: Insufficient documentation

## 2013-01-24 DIAGNOSIS — I739 Peripheral vascular disease, unspecified: Secondary | ICD-10-CM

## 2013-01-24 DIAGNOSIS — Z1231 Encounter for screening mammogram for malignant neoplasm of breast: Secondary | ICD-10-CM

## 2013-01-24 DIAGNOSIS — R51 Headache: Secondary | ICD-10-CM | POA: Insufficient documentation

## 2013-01-24 DIAGNOSIS — G3184 Mild cognitive impairment, so stated: Secondary | ICD-10-CM | POA: Insufficient documentation

## 2013-01-24 DIAGNOSIS — G35 Multiple sclerosis: Secondary | ICD-10-CM | POA: Insufficient documentation

## 2013-01-24 DIAGNOSIS — R279 Unspecified lack of coordination: Secondary | ICD-10-CM | POA: Insufficient documentation

## 2013-01-24 MED ORDER — DICLOFENAC SODIUM 1 % TD GEL: 2.0000 g | Freq: Four times a day (QID) | TRANSDERMAL | Status: DC

## 2013-01-24 MED ORDER — TRAMADOL HCL 50 MG PO TABS: 50.0000 mg | ORAL_TABLET | Freq: Four times a day (QID) | ORAL | Status: DC | PRN

## 2013-01-24 NOTE — Progress Notes (Signed)
Subjective:    Patient ID: Michelle Hodges, female    DOB: Jun 30, 1945, 67 y.o.   MRN: [id number]  HPI  Mrs. Scalzo is back regarding her MS and associated gait disorder. She has tried to stay pretty active with her exercises at home. She tries to walk on a daily basis. She uses a cane for balance. Her daughter feels that her gait is more unsteady as a whole.   She has had difficulties with night time dysesthesias. Her neurologist at [facility] placed her on gabapentin which seems to have helped a bit. She has little pain during the day.  Tramadol is still effective for headaches and she likes the voltaren gel still for joint pain.   Pain Inventory Average Pain 5 Pain Right Now 7 My pain is tingling and aching  In the last 24 hours, has pain interfered with the following? General activity 7 Relation with others 7 Enjoyment of life 5 What TIME of day is your pain at its worst? night Sleep (in general) Fair  Pain is worse with: inactivity Pain improves with: therapy/exercise and medication Relief from Meds: 6  Mobility walk with assistance use a cane how many minutes can you walk? 20 ability to climb steps?  yes do you drive?  no needs help with transfers Do you have any goals in this area?  yes  Function I need assistance with the following:  feeding, bathing and meal prep  Neuro/Psych numbness tremor tingling trouble walking  Prior Studies Any changes since last visit?  no  Physicians involved in your care Any changes since last visit?  no   Family History  Problem Relation Age of Onset  . Stroke Father   . Stroke Sister   . Cancer - Other Maternal Grandmother     stomach  . Multiple sclerosis Cousin    History   Social History  . Marital Status: Divorced    Spouse Name: N/A    Number of Children: 2  . Years of Education: B.S.   Occupational History  .  Unemployed    [employer], part-time   Social History Main Topics  . Smoking status: Former  Smoker    Quit date: 02/04/1984  . Smokeless tobacco: None  . Alcohol Use: Yes     Comment: Wine Occassionally  . Drug Use: No     Comment: "I'm around it", quit using 04/05/2012  . Sexual Activity: None   Other Topics Concern  . None   Social History Narrative   Pt lives at home with her daughter.   Caffeine Use- She does drink coffee.   Past Surgical History  Procedure Laterality Date  . Tonsillectomy    . Colonoscopy N/A 04/20/2012    Procedure: COLONOSCOPY;  Surgeon: Beverley Fiedler, MD;  Location: [facility] ENDOSCOPY;  Service: Gastroenterology;  Laterality: N/A;  . Cystectomy      in HS   Past Medical History  Diagnosis Date  . History of blood transfusion   . Hypertension    BP 118/74  Pulse 78  Resp 14  Ht 5\' 9"  (1.753 m)  Wt 135 lb (61.236 kg)  BMI 19.93 kg/m2  SpO2 96%     Review of Systems  Gastrointestinal: Positive for diarrhea.  Musculoskeletal: Positive for gait problem.  Neurological: Positive for tremors and numbness.       Tingling  All other systems reviewed and are negative.       Objective:   Physical Exam  General: Alert and  oriented x 3, No apparent distress  HEENT: Head is normocephalic, atraumatic, PERRLA, EOMI, sclera anicteric, oral mucosa pink and moist, dentition intact, ext ear canals clear,  Neck: Supple without JVD or lymphadenopathy  Heart: Reg rate and rhythm. No murmurs rubs or gallops  Chest: CTA bilaterally without wheezes, rales, or rhonchi; no distress  Abdomen: Soft, non-tender, non-distended, bowel sounds positive.  Extremities: No clubbing, cyanosis, or edema. Pulses are 2+  Skin: Clean and intact without signs of breakdown  Neuro: Pt is cognitively appropriate with normal insight, memory, and awareness. Cranial nerves 2-12 are intact. Sensory exam is normal essentially on the left and 1+ on the right with decreased proprioception noted. . Reflexes are 2+ in all 4's. Fine motor coordination is intact. No tremors. Motor function  is grossly 5/5 left, 4+/5 RUE and 4+/5 RLE. She ambulated with her cane today and tends to shuffle the left foot forward as she is not entirely confident placing full weight on the right. She was wearing her AFO on the right along with over the ankle boots.  Short term memory is improved but remains impaired. She tries to cover up frequently for memory lapses. .  Musculoskeletal: Full ROM, No pain with AROM or PROM in the neck, trunk, or extremities. Posture appropriate  Psych: Pt's affect is appropriate. Pt is cooperative. She is pleasant as always!    Assessment & Plan:   1. 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  2. Headaches--likely related to dental caries  3. Dysesthesias related to #1  Plan:  1. I am in agreement with the gabapentin. i recommended moving the am dose to night to make 200mg  total which may be more effective for her pain pattern without increasing her overall dose. She can also use the tramadol for this pain.  2. Continue with cane for balance when up. I think she definitely should continue to use the AFO given her balance.   3. Tramadol for headaches-refill provided again today.  4. Follow up with me in 6 months. MS mgt per neurology  5. 30 minutes of face to face patient care time were spent during this visit. All questions were encouraged and answered.

## 2013-01-24 NOTE — Patient Instructions (Signed)
TRY TAKING TWO GABAPENTIN AT NIGHT. STOP THE MORNING GABAPENTIN FOR NOW. YOU WILL STILL TAKE A TOTAL OF 300MG  DAILY   USE YOUR BRACE WHEN YOU WALK.   SLOW DOWN WHEN YOU WALK AND MAKE SURE YOU SHIFT YOUR WEIGHT TO THE RIGHT.

## 2013-02-09 ENCOUNTER — Encounter: Payer: Self-pay | Admitting: Diagnostic Neuroimaging

## 2013-02-09 ENCOUNTER — Telehealth: Payer: Self-pay | Admitting: Diagnostic Neuroimaging

## 2013-02-09 NOTE — Telephone Encounter (Signed)
Called patient to reschedule 03/28/13, no answer on preferred number and unable to leave voicemail message because phone kept on ringing. Rescheduled to 05/03/13, printed letter and sent.

## 2013-03-05 ENCOUNTER — Other Ambulatory Visit: Payer: Self-pay | Admitting: Physical Medicine & Rehabilitation

## 2013-03-07 NOTE — Telephone Encounter (Signed)
Tramadol refill called to pharmacy per electronic request.

## 2013-03-08 ENCOUNTER — Telehealth: Payer: Self-pay | Admitting: *Deleted

## 2013-03-08 NOTE — Telephone Encounter (Signed)
Message was sent to Dr. Marjory LiesPenumalli, requesting lab results, by patient's daughter Beverlee Nimsllison Chiles, contact # is [phone].

## 2013-03-28 ENCOUNTER — Ambulatory Visit: Payer: Medicare Other | Admitting: Diagnostic Neuroimaging

## 2013-05-03 ENCOUNTER — Ambulatory Visit (INDEPENDENT_AMBULATORY_CARE_PROVIDER_SITE_OTHER): Payer: Medicare Other | Admitting: Diagnostic Neuroimaging

## 2013-05-03 ENCOUNTER — Encounter (INDEPENDENT_AMBULATORY_CARE_PROVIDER_SITE_OTHER): Payer: Self-pay

## 2013-05-03 ENCOUNTER — Encounter: Payer: Self-pay | Admitting: Diagnostic Neuroimaging

## 2013-05-03 VITALS — BP 89/53 | HR 62 | Ht 69.0 in | Wt 128.0 lb

## 2013-05-03 DIAGNOSIS — G35 Multiple sclerosis: Secondary | ICD-10-CM

## 2013-05-03 NOTE — Progress Notes (Signed)
[facility]  PATIENT: Michelle CroissantBrenda S Hodges DOB: 06/03/1945  REFERRING CLINICIAN:  HISTORY FROM: patient and daughter REASON FOR VISIT: follow up   HISTORICAL  CHIEF COMPLAINT:  Chief Complaint  Patient presents with  . Follow-up    ms    HISTORY OF PRESENT ILLNESS:   UPDATE 05/03/13: Since last visit patient unfortunately did not followup. Patient did have [facility] (Dr. Renne CriglerPharr). Patient has had progressive weakness and difficulty. She still reluctant to try disease modifying therapy. Laboratory testing reviewed with patient. Patient's daughter is here for the visit as well.  PRIOR HPI (05/14/12): 68 year old right-handed female here for evaluation of possible multiple sclerosis.  Patient was admitted to the hospital in March 2014 for right-sided weakness and possible stroke. MRI of the brain showed no acute stroke. MRI cervical spine showed multiple spinal cord lesions. Patient had lumbar puncture and other blood testing. Patient was given diagnosis of possible multiple sclerosis, treated with IV steroids, transferred to inpatient rehabilitation, and then sent home.  In retrospect patient has had at least 5-10 year history of balance and gait difficulty with intermittent falls. She has had intermittent episodes of double vision, numbness in her hands and feet, worsening memory. Some symptoms date back to 1995. Unfortunately patient did not seek medical attention for these issues over the years.  Since discharge patient has been stable. She still has significant gait difficulty and uses a single-point cane for ambulation. She's not back to work.  REVIEW OF SYSTEMS: Full 14 system review of systems performed and notable only for decreased appetite chills fatigue and pains next if this ringing in ears restless legs insomnia snoring sleep talking walking difficulty speech difficulty dizziness headache numbness behavior problem bruising  incontinence he can cold intolerance double vision eye itching.   ALLERGIES: Allergies  Allergen Reactions  . Aspirin     Does not take    HOME MEDICATIONS: Outpatient Prescriptions Prior to Visit  Medication Sig Dispense Refill  . Brewers Yeast 487.5 MG TABS Take by mouth 2 times daily. 1 in am , 2 at evening      . fish oil-omega-3 fatty acids 1000 MG capsule Take 2 g by mouth daily.      Marland Kitchen. gabapentin (NEURONTIN) 100 MG capsule 100 mg.      . MAGNESIUM CITRATE PO Take by mouth.      . traMADol (ULTRAM) 50 MG tablet Take 1 tablet (50 mg total) by mouth every 6 (six) hours as needed.  60 tablet  0  . Cholecalciferol (PA VITAMIN D-3) 2000 UNITS CAPS Take by mouth daily.      . Cholecalciferol (VITAMIN D PO) Take by mouth.      . diclofenac sodium (VOLTAREN) 1 % GEL Apply 2 g topically 4 (four) times daily.  1 Tube  5  . traMADol (ULTRAM) 50 MG tablet TAKE 1 TABLET BY MOUTH EVERY 6 HOURS AS NEEDED  60 tablet  1   No facility-administered medications prior to visit.    PAST MEDICAL HISTORY: Past Medical History  Diagnosis Date  . History of blood transfusion   . Hypertension     PAST SURGICAL HISTORY: Past Surgical History  Procedure Laterality Date  . Tonsillectomy    . Colonoscopy N/A 04/20/2012    Procedure: COLONOSCOPY;  Surgeon: Beverley FiedlerJay M Pyrtle, MD;  [facility];  Service: Gastroenterology;  Laterality: N/A;  . Cystectomy      in HS    FAMILY  HISTORY: Family History  Problem Relation Age of Onset  . Stroke Father   . Stroke Sister   . Cancer - Other Maternal Grandmother     stomach  . Multiple sclerosis Cousin     SOCIAL HISTORY:  History   Social History  . Marital Status: Divorced    Spouse Name: N/A    Number of Children: 2  . Years of Education: B.S.   Occupational History  .  Unemployed    [employer], part-time   Social History Main Topics  . Smoking status: Former Smoker    Quit date: 02/04/1984  . Smokeless tobacco: Never Used  .  Alcohol Use: Yes     Comment: Wine Occassionally  . Drug Use: No     Comment: "I'm around it", quit using 04/05/2012  . Sexual Activity: Not on file   Other Topics Concern  . Not on file   Social History Narrative   Pt lives at home with her daughter.   Caffeine Use- She does drink coffee.     PHYSICAL EXAM  Filed Vitals:   05/03/13 1203 05/03/13 1213  BP: 83/56 89/53  Pulse: 69 62  Height: 5\' 9"  (1.753 m)   Weight: 128 lb (58.06 kg)    Body mass index is 18.89 kg/(m^2).  GENERAL EXAM: Patient is in no distress  CARDIOVASCULAR: Regular rate and rhythm, no murmurs, no carotid bruits  NEUROLOGIC: MENTAL STATUS: awake, alert, language fluent, comprehension intact, naming intact; SOMEWHAT REPETITIVE AT TIMES, THEN TANGENTIAL AT OTHER TIMES. POOR INSIGHT. CRANIAL NERVE: no papilledema on fundoscopic exam, pupils equal and reactive to light, visual fields full to confrontation, extraocular muscles intact, NYSTAGMUS ON ENG GAZE, SACCADIC DYSMETRIA, facial sensation and strength symmetric, uvula midline, shoulder shrug symmetric, tongue midline. MOTOR: INCREASED TONE IN BLE. BUE 4. BLE 3.  SENSORY: normal and symmetric to light touch COORDINATION: finger-nose-finger, fine finger movements SLOW WITH DYSMETRIA REFLEXES: deep tendon reflexes BRISK IN BLE and symmetric GAIT/STATION: WIDE, ATAXIC GAIT; VERY UNSTEADY   DIAGNOSTIC DATA (LABS, IMAGING, TESTING) - I reviewed patient records, labs, notes, testing and imaging myself where available.  Lab Results  Component Value Date   WBC 7.4 04/22/2012   HGB 11.6* 04/22/2012   HCT 34.3* 04/22/2012   MCV 90.3 04/22/2012   PLT 200 04/22/2012      Component Value Date/Time   NA 145 04/22/2012 0643   K 3.9 04/22/2012 0643   CL 106 04/22/2012 0643   CO2 32 04/22/2012 0643   GLUCOSE 88 04/22/2012 0643   GLUCOSE 97 11/18/2005 1113   BUN 10 04/22/2012 0643   CREATININE 0.90 04/22/2012 0643   CALCIUM 9.0 04/22/2012 0643   PROT 5.8* 04/22/2012  0643   ALBUMIN 2.9* 04/22/2012 0643   AST 15 04/22/2012 0643   ALT 24 04/22/2012 0643   ALKPHOS 39 04/22/2012 0643   BILITOT 0.7 04/22/2012 0643   GFRNONAA 66* 04/22/2012 0643   GFRAA 76* 04/22/2012 0643   Lab Results  Component Value Date   CHOL 154 04/16/2012   HDL 56 04/16/2012   LDLCALC 84 04/16/2012   TRIG 71 04/16/2012   CHOLHDL 2.8 04/16/2012   Lab Results  Component Value Date   HGBA1C 5.6 04/16/2012   Lab Results  Component Value Date   VITAMINB12 493 04/15/2012   Lab Results  Component Value Date   TSH 3.985 04/15/2012    CSF studies (04/19/12): OCB: >5 well defined gamma restriction bands that are not present in the patient's corresponding serum  sample MBP < 2 WBC 4, RBC 990, Glucose 88, Protein 24 Cultures negative  UDS - positive THC JCV antibody - 0.35 (H) ANA - negative ACE - 51 pANCA, cANCA - normal NMO-IgG - < 1.6 (normal)  04/15/12 MRI brain - multiple periventricular and subcortical T2 hyperintense lesions  04/16/12 MRI cervical spine - T2 hyperintense spinal cord lesions at C3, C4, T2 on the right, and C5 on the left  ASSESSMENT AND PLAN  68 y.o. year old female  has a past medical history of History of blood transfusion and Hypertension. here with suspected multiple sclerosis. 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  PLAN: - follow up with [facility] neurology (Dr. [name]); once stabilized, may return here for local follow up in the future - consider tecfidera (patient doesn't want injections)  Return if symptoms worsen or fail to improve.    Suanne Marker, MD 05/03/2013, 1:26 PM Certified in Neurology, Neurophysiology and Neuroimaging  [facility] [address] [place], [place] [address] [phone]

## 2013-05-03 NOTE — Patient Instructions (Signed)
Consider tecfidera.

## 2013-05-09 ENCOUNTER — Telehealth: Payer: Self-pay | Admitting: Physical Medicine & Rehabilitation

## 2013-05-09 DIAGNOSIS — G35 Multiple sclerosis: Secondary | ICD-10-CM

## 2013-05-09 DIAGNOSIS — R609 Edema, unspecified: Secondary | ICD-10-CM

## 2013-05-09 DIAGNOSIS — Q232 Congenital mitral stenosis: Secondary | ICD-10-CM

## 2013-05-09 NOTE — Telephone Encounter (Signed)
Requesting an order for support hose and/or socks to be called in to [facility] on [address]

## 2013-05-10 NOTE — Telephone Encounter (Signed)
i am not aware that insurance will pay for OTC hose---however ive written an rx

## 2013-05-10 NOTE — Telephone Encounter (Signed)
Notified Ms Bily that we have an rx and she requested it be mailed to her.  Done.

## 2013-05-10 NOTE — Telephone Encounter (Signed)
Do you feel these are appropriate for the pt? A lot of times if there is an order then insurance or health care savings plan will pay for them.

## 2013-05-10 NOTE — Telephone Encounter (Signed)
Any hose or socks she would get form [facility] would be OTC.  Custom socks would have to fitted through a orthotist or in some cases a medical supply store.

## 2013-06-24 ENCOUNTER — Other Ambulatory Visit: Payer: Self-pay | Admitting: Physical Medicine & Rehabilitation

## 2013-06-24 ENCOUNTER — Telehealth: Payer: Self-pay

## 2013-06-24 MED ORDER — DICLOFENAC SODIUM 1 % TD GEL: 2.0000 g | Freq: Three times a day (TID) | TRANSDERMAL | Status: DC

## 2013-06-24 NOTE — Telephone Encounter (Signed)
rx written

## 2013-06-24 NOTE — Telephone Encounter (Signed)
Patient is requesting a refill on Voltaren Gel. It looks as it was D/C at the last OV.  Please advise.

## 2013-06-24 NOTE — Telephone Encounter (Signed)
Contacted patient to inform her that the refills on Tramadol and Voltaren Gel has been sent to the pharmacy.

## 2013-07-22 ENCOUNTER — Other Ambulatory Visit: Payer: Self-pay | Admitting: Physical Medicine & Rehabilitation

## 2013-07-25 ENCOUNTER — Encounter: Payer: Self-pay | Admitting: Physical Medicine & Rehabilitation

## 2013-07-25 ENCOUNTER — Encounter: Payer: Medicare Other | Attending: Physical Medicine & Rehabilitation | Admitting: Physical Medicine & Rehabilitation

## 2013-07-25 VITALS — BP 106/68 | HR 64 | Resp 14 | Ht 69.0 in | Wt 123.0 lb

## 2013-07-25 DIAGNOSIS — G35 Multiple sclerosis: Secondary | ICD-10-CM | POA: Diagnosis present

## 2013-07-25 DIAGNOSIS — I10 Essential (primary) hypertension: Secondary | ICD-10-CM | POA: Insufficient documentation

## 2013-07-25 DIAGNOSIS — R269 Unspecified abnormalities of gait and mobility: Secondary | ICD-10-CM | POA: Insufficient documentation

## 2013-07-25 DIAGNOSIS — R51 Headache: Secondary | ICD-10-CM | POA: Insufficient documentation

## 2013-07-25 DIAGNOSIS — Z87891 Personal history of nicotine dependence: Secondary | ICD-10-CM | POA: Insufficient documentation

## 2013-07-25 NOTE — Progress Notes (Signed)
Subjective:    Patient ID: Michelle Hodges, female    DOB: February 02, 1946, 68 y.o.   MRN: [id number]  HPI  Michelle Hodges is here regarding her MS. She continues to have dysesthesias on her right side, particularly at night time. They seem to be worse when she lies on her right side. She takes her gabapentin 100mg  up to 3 x per day.   She admits to falling once since she last saw me.  She walks daily with her cane. She doesn't use her walker often.   Pain Inventory Average Pain 5 Pain Right Now 2 My pain is tingling and aching  In the last 24 hours, has pain interfered with the following? General activity 5 Relation with others 5 Enjoyment of life 5 What TIME of day is your pain at its worst? night Sleep (in general) Fair  Pain is worse with: inactivity Pain improves with: medication Relief from Meds: did not answer  Mobility use a cane  Function I need assistance with the following:  meal prep, household duties and shopping  Neuro/Psych bladder control problems tingling trouble walking  Prior Studies Any changes since last visit?  no  Physicians involved in your care Any changes since last visit?  no   Family History  Problem Relation Age of Onset  . Stroke Father   . Stroke Sister   . Cancer - Other Maternal Grandmother     stomach  . Multiple sclerosis Cousin    History   Social History  . Marital Status: Divorced    Spouse Name: N/A    Number of Children: 2  . Years of Education: B.S.   Occupational History  .  Unemployed    [employer], part-time   Social History Main Topics  . Smoking status: Former Smoker    Quit date: 02/04/1984  . Smokeless tobacco: Never Used  . Alcohol Use: Yes     Comment: Wine Occassionally  . Drug Use: No     Comment: "I'm around it", quit using 04/05/2012  . Sexual Activity: None   Other Topics Concern  . None   Social History Narrative   Pt lives at home with her daughter.   Caffeine Use- She does drink coffee.     Past Surgical History  Procedure Laterality Date  . Tonsillectomy    . Colonoscopy N/A 04/20/2012    Procedure: COLONOSCOPY;  Surgeon: Beverley Fiedler, MD;  Location: [facility] ENDOSCOPY;  Service: Gastroenterology;  Laterality: N/A;  . Cystectomy      in HS   Past Medical History  Diagnosis Date  . History of blood transfusion   . Hypertension    BP 106/68  Pulse 64  Resp 14  Ht 5\' 9"  (1.753 m)  Wt 123 lb (55.792 kg)  BMI 18.16 kg/m2  SpO2 99%  Opioid Risk Score:   Fall Risk Score: High Fall Risk (>13 points) (educated and handout given for fall prevention in the home)  Review of Systems  Genitourinary:       Bladder control problems  Musculoskeletal: Positive for gait problem.  Neurological:       Tingling  All other systems reviewed and are negative.      Objective:   Physical Exam  General: Alert and oriented x 3, No apparent distress  HEENT: Head is normocephalic, atraumatic, PERRLA, EOMI, sclera anicteric, oral mucosa pink and moist, dentition intact, ext ear canals clear,  Neck: Supple without JVD or lymphadenopathy  Heart: Reg rate and rhythm.  No murmurs rubs or gallops  Chest: CTA bilaterally without wheezes, rales, or rhonchi; no distress  Abdomen: Soft, non-tender, non-distended, bowel sounds positive.  Extremities: No clubbing, cyanosis, or edema. Pulses are 2+  Skin: Clean and intact without signs of breakdown  Neuro: Pt is cognitively appropriate with normal insight, memory, and awareness. Cranial nerves 2-12 are intact. Sensory exam is normal essentially on the left and 1+ on the right with decreased proprioception noted. . Reflexes are 2+ in all 4's. Fine motor coordination is intact. No tremors. Motor function is grossly 5/5 left, 4+/5 RUE and 4+/5 RLE. She ambulated with her cane today and tends to shuffle the RIGHT foot forward. Her shoe is warn anteriorly as well from all of the shuffling Short term memory is slightly impaired  But stable. Good awareness and  insight.  Musculoskeletal: Full ROM, No pain with AROM or PROM in the neck, trunk, or extremities. Posture appropriate  Psych: Pt's affect is appropriate. Pt is cooperative. She is pleasant as always!  Assessment & Plan:   1. 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  2. Headaches--likely related to dental caries  3. Dysesthesias related to #1    Plan:   1.Try 300mg  gabapentin at night to better cover neuropathic pain.  2. Should wear AFO most of the day, especially when out of the house. Recommended moving to a quad cane or walker when outside the house as well.   3. Tramadol for headaches-refilled recently 4. Follow up with me in 6 months. MS mgt per neurology  5. 15 minutes of face to face patient care time were spent during this visit. All questions were encouraged and answered.

## 2013-07-25 NOTE — Patient Instructions (Addendum)
TRY TAKING 3 OF THE GABAPENTIN AT NIGHT (300MG  TOTAL) TO BETTER HELP THE RIGHT SIDED NERVE PAIN. IF IT IS HELPFUL, WE CAN SWITCH YOU TO A 300MG  CAPSULE   PLEASE USE YOUR WALKER OR A FOUR-PRONGED CANE FOR AMBULATION OUTSIDE OF THE HOUSE!!!   WEAR YOUR RIGHT ANKLE/FOOT BRACE AT LEAST WHEN YOUR OUTSIDE THE HOUSE----PREFERABLY IN THE HOUSE TOO IF POSSIBLE!!

## 2013-08-12 ENCOUNTER — Telehealth: Payer: Self-pay

## 2013-08-12 NOTE — Telephone Encounter (Signed)
If she continues to have pain in the left arm after her fall, the arm needs to be checked out. Certainly, if it's not feeling better by Monday, she should call us back. If it worsens, she should go to urgent care over the weekend.

## 2013-08-12 NOTE — Telephone Encounter (Signed)
FYI:: Patient states that she has been having right side pain that comes and goes. Left arm throbbing pain. Patient is having weakness and gait problems also. Patient reports that she fell last night, but was not injured. She states that the MS MD put her on a new medication recently. Patient did not know the name of the medication. Advised patient to contact the MS MD and see if the new medication she was prescribed could be causing the symptoms. Patient verbalized understanding.

## 2013-08-15 NOTE — Telephone Encounter (Signed)
Attempted to contact patient at different numbers listed. No answer nor voicemail, and the other number was disconnected.

## 2013-08-26 ENCOUNTER — Telehealth: Payer: Self-pay

## 2013-08-26 NOTE — Telephone Encounter (Signed)
Patient called requesting a refill on her tramadol and an RX for support hose.

## 2013-08-29 MED ORDER — TRAMADOL HCL 50 MG PO TABS: ORAL_TABLET | ORAL | Status: DC

## 2013-08-29 NOTE — Telephone Encounter (Signed)
Support hose can be purchased over counter unless she wants a higher pressure stocking. A refill of tramadol could be called in to her pharmacy.

## 2013-08-29 NOTE — Telephone Encounter (Signed)
Contacted patient/patient's daughter to inform her that her Tramadol RX was called in at the pharmacy and per Dr. Riley Kill she can purchase support hose OTC.

## 2013-08-30 ENCOUNTER — Telehealth: Payer: Self-pay

## 2013-08-30 NOTE — Telephone Encounter (Signed)
Patient is requesting a RX for support hose. Patient states she does not have transportation to pick up the RX and would like it mailed to her.

## 2013-09-01 NOTE — Telephone Encounter (Signed)
Unable to contact patient to inform her that she can purchase support hose without an rx.

## 2013-11-15 ENCOUNTER — Other Ambulatory Visit: Payer: Self-pay

## 2013-11-15 DIAGNOSIS — Z1231 Encounter for screening mammogram for malignant neoplasm of breast: Secondary | ICD-10-CM

## 2014-01-24 ENCOUNTER — Ambulatory Visit: Payer: Medicare Other | Admitting: Physical Medicine & Rehabilitation

## 2014-01-25 ENCOUNTER — Ambulatory Visit
Admission: RE | Admit: 2014-01-25 | Discharge: 2014-01-25 | Disposition: A | Discharge status: Home / Self Care | Payer: Medicare Other | Source: Ambulatory Visit

## 2014-01-25 DIAGNOSIS — Z1231 Encounter for screening mammogram for malignant neoplasm of breast: Secondary | ICD-10-CM

## 2014-02-01 ENCOUNTER — Encounter: Payer: Self-pay | Admitting: Physical Medicine & Rehabilitation

## 2014-02-01 ENCOUNTER — Encounter: Payer: Medicare Other | Attending: Physical Medicine & Rehabilitation | Admitting: Physical Medicine & Rehabilitation

## 2014-02-01 VITALS — BP 113/70 | HR 62 | Resp 14

## 2014-02-01 DIAGNOSIS — R51 Headache: Secondary | ICD-10-CM | POA: Diagnosis not present

## 2014-02-01 DIAGNOSIS — K029 Dental caries, unspecified: Secondary | ICD-10-CM | POA: Insufficient documentation

## 2014-02-01 DIAGNOSIS — Z79899 Other long term (current) drug therapy: Secondary | ICD-10-CM | POA: Diagnosis not present

## 2014-02-01 DIAGNOSIS — M6289 Other specified disorders of muscle: Secondary | ICD-10-CM | POA: Diagnosis not present

## 2014-02-01 DIAGNOSIS — G35 Multiple sclerosis: Secondary | ICD-10-CM | POA: Diagnosis present

## 2014-02-01 DIAGNOSIS — R531 Weakness: Secondary | ICD-10-CM

## 2014-02-01 DIAGNOSIS — I10 Essential (primary) hypertension: Secondary | ICD-10-CM | POA: Insufficient documentation

## 2014-02-01 DIAGNOSIS — Z87891 Personal history of nicotine dependence: Secondary | ICD-10-CM | POA: Diagnosis not present

## 2014-02-01 DIAGNOSIS — R29818 Other symptoms and signs involving the nervous system: Secondary | ICD-10-CM | POA: Insufficient documentation

## 2014-02-01 NOTE — Progress Notes (Signed)
Subjective:    Patient ID: Michelle Hodges, female    DOB: 12/04/1945, 68 y.o.   MRN: [id number]  HPI   Michelle Hodges is back regarding her MS and associated pain. She is still having some pain in her arms and hands but this has been better since she changed her sleeping positions. She has mild gluteal pain on the riht which responds to stretching.   She has remained pro-active with her HEP. In fact, I have seen her outside many days myself!  She reports no progression of her MS related symptoms.   She uses tramadol occasionally for breakthrough pain.   Pain Inventory Average Pain 7 Pain Right Now 7 My pain is intermittent  In the last 24 hours, has pain interfered with the following? General activity 5 Relation with others 5 Enjoyment of life 7 What TIME of day is your pain at its worst? daytime, night Sleep (in general) Good  Pain is worse with: sitting and inactivity Pain improves with: medication Relief from Meds: 7  Mobility use a cane ability to climb steps?  yes do you drive?  no  Function I need assistance with the following:  meal prep, household duties and shopping  Neuro/Psych numbness tingling  Prior Studies Any changes since last visit?  no  Physicians involved in your care Any changes since last visit?  no   Family History  Problem Relation Age of Onset  . Stroke Father   . Stroke Sister   . Cancer - Other Maternal Grandmother     stomach  . Multiple sclerosis Cousin    History   Social History  . Marital Status: Divorced    Spouse Name: N/A    Number of Children: 2  . Years of Education: B.S.   Occupational History  .  Unemployed    [employer], part-time   Social History Main Topics  . Smoking status: Former Smoker    Quit date: 02/04/1984  . Smokeless tobacco: Never Used  . Alcohol Use: Yes     Comment: Wine Occassionally  . Drug Use: No     Comment: "I'm around it", quit using 04/05/2012  . Sexual Activity: None   Other  Topics Concern  . None   Social History Narrative   Pt lives at home with her daughter.   Caffeine Use- She does drink coffee.   Past Surgical History  Procedure Laterality Date  . Tonsillectomy    . Colonoscopy N/A 04/20/2012    Procedure: COLONOSCOPY;  Surgeon: Beverley FiedlerJay M Pyrtle, MD;  [facility];  Service: Gastroenterology;  Laterality: N/A;  . Cystectomy      in HS   Past Medical History  Diagnosis Date  . History of blood transfusion   . Hypertension    There were no vitals taken for this visit.  Opioid Risk Score:   Fall Risk Score:  2 Review of Systems  Neurological: Positive for numbness.       Tingling   All other systems reviewed and are negative.      Objective:   Physical Exam  General: Alert and oriented x 3, No apparent distress  HEENT: Head is normocephalic, atraumatic, PERRLA, EOMI, sclera anicteric, oral mucosa pink and moist, dentition intact, ext ear canals clear,  Neck: Supple without JVD or lymphadenopathy  Heart: Reg rate and rhythm. No murmurs rubs or gallops  Chest: CTA bilaterally without wheezes, rales, or rhonchi; no distress  Abdomen: Soft, non-tender, non-distended, bowel sounds positive.  Extremities:  No clubbing, cyanosis, or edema. Pulses are 2+  Skin: Clean and intact without signs of breakdown  Neuro: Pt is cognitively appropriate with normal insight, memory, and awareness. Cranial nerves 2-12 are intact. Sensory exam is normal essentially on the left and 1+ on the right with decreased proprioception noted. . Reflexes are 2+ in all 4's. Fine motor coordination is intact. No tremors. Motor function is grossly 5/5 left, 4+/5 RUE and 4+/5 RLE. She ambulated with her cane today and her form is improved. She still tends to use the HF to assist the swing of her distal legs.  Short term memory reamins slightly impaired but is functional. Good awareness and insight.  Musculoskeletal: Full ROM, No pain with AROM or PROM in the neck, trunk, or  extremities. Posture appropriate  Psych: Pt's affect is appropriate. Pt is cooperative. She is pleasant as always!    Assessment & Plan:   1. 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  2. Headaches--likely related to dental caries  3. Dysesthesias related to #1    Plan:  1.Try 300mg  gabapentin at night to better cover neuropathic pain.  2.  Straight cane for balance. Continue HEP. Reviewed importance of adequate stretching, rest breaks and use of correct form 3. Tramadol for headaches-no refills needed. 4. Follow up with me in 6 months. MS mgt per neurology  5. 15 minutes of face to face patient care time were spent during this visit. All questions were encouraged and answered.

## 2014-02-01 NOTE — Patient Instructions (Addendum)
PLEASE CALL ME WITH ANY PROBLEMS OR QUESTIONS (#[phone]).     CONTINUE TO WORK ON STRETCHING OF YOUR RIGHT HIP BEFORE AND AFTER EXERCISE

## 2014-03-13 ENCOUNTER — Other Ambulatory Visit: Payer: Self-pay | Admitting: Physical Medicine & Rehabilitation

## 2014-04-12 IMAGING — MG STANDARD SCREENING - COMBO
8 series · 9 of 24 positions shown · non-contrast
Comparison: Previous exam(s).

CLINICAL DATA: Screening.

EXAM:
DIGITAL SCREENING BILATERAL MAMMOGRAM WITH CAD
DIGITAL BREAST TOMOSYNTHESIS
Digital breast tomosynthesis images are acquired in two projections.
These images are reviewed in combination with the digital mammogram,
confirming the findings below.

[R MLO]
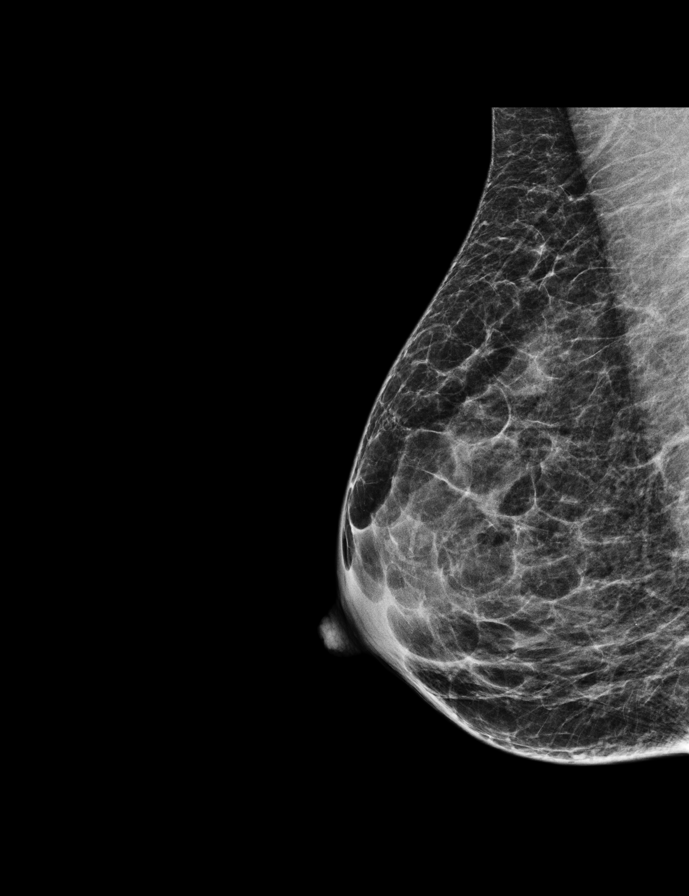

[R CC]
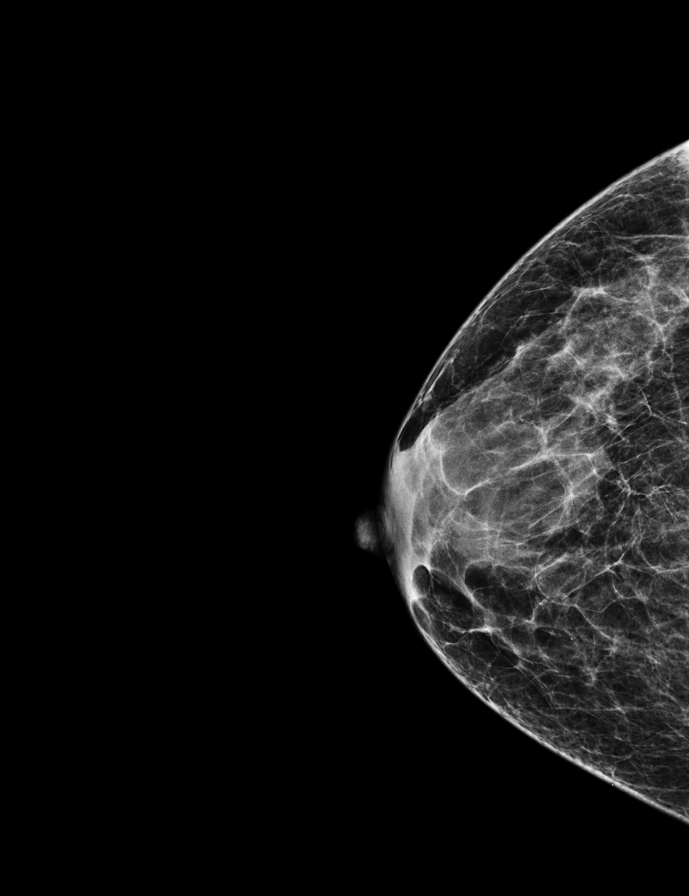

[L MLO]
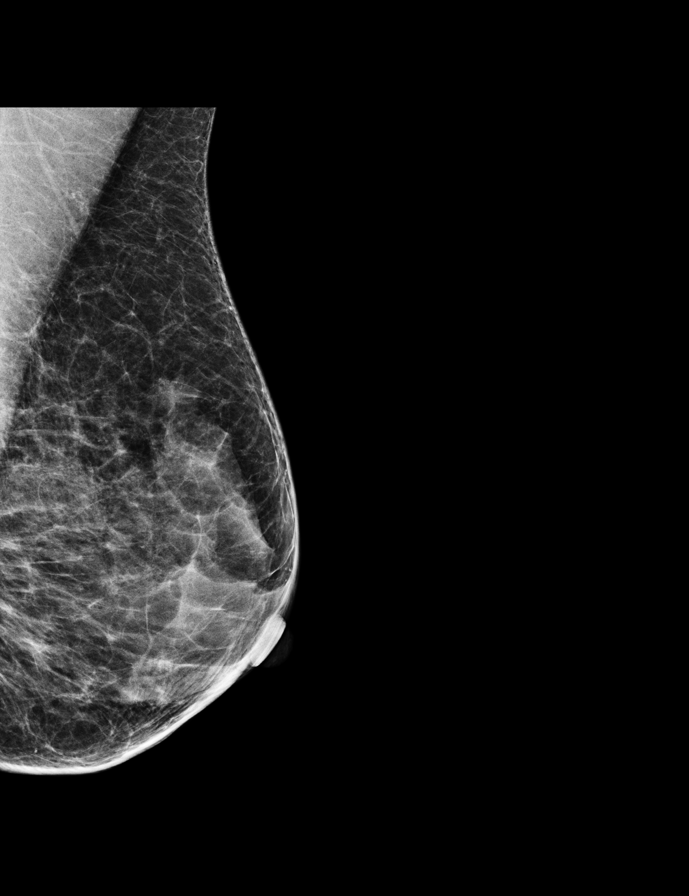

[L CC]
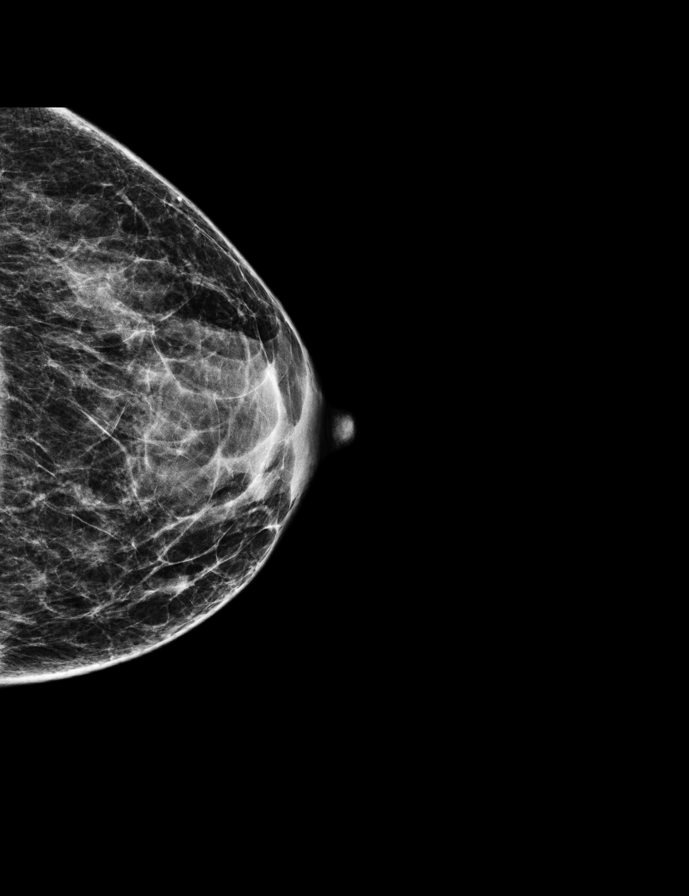

[L MLO tomo · 2 of 45 frames shown]
[frame 15/45]
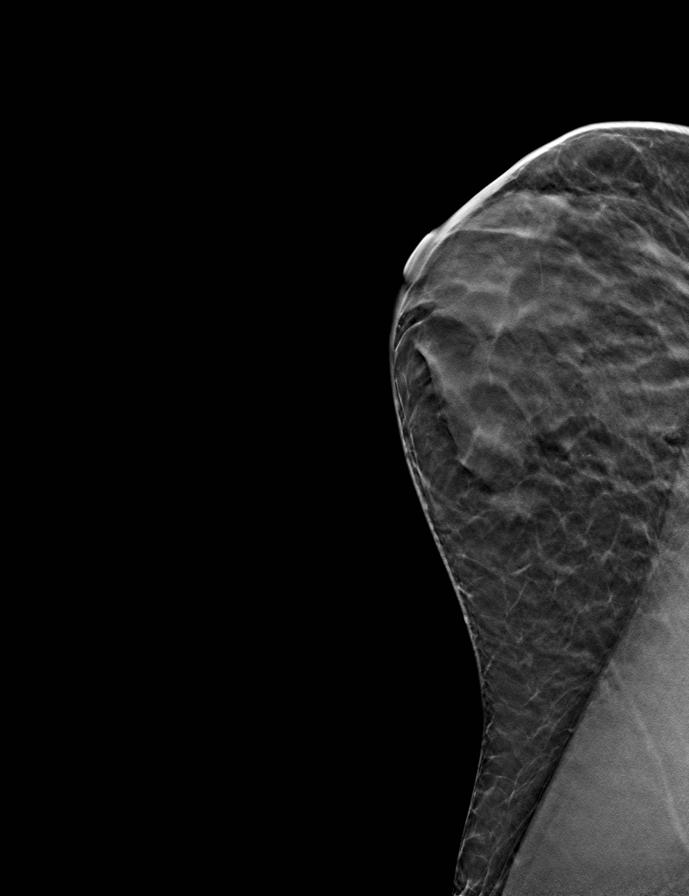
[frame 23/45]
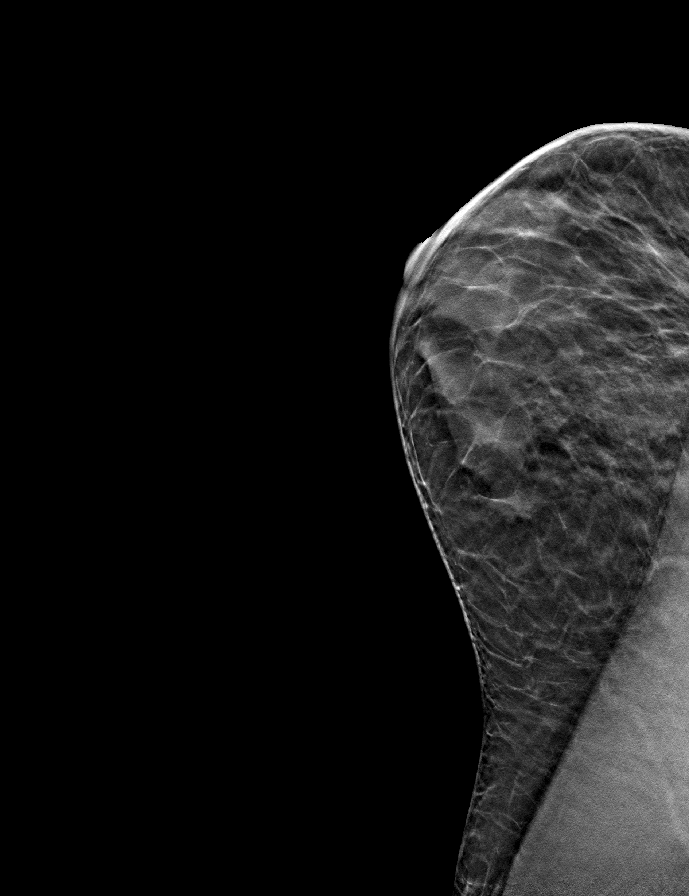

[R CC tomo · tomo slice 21/41.0]
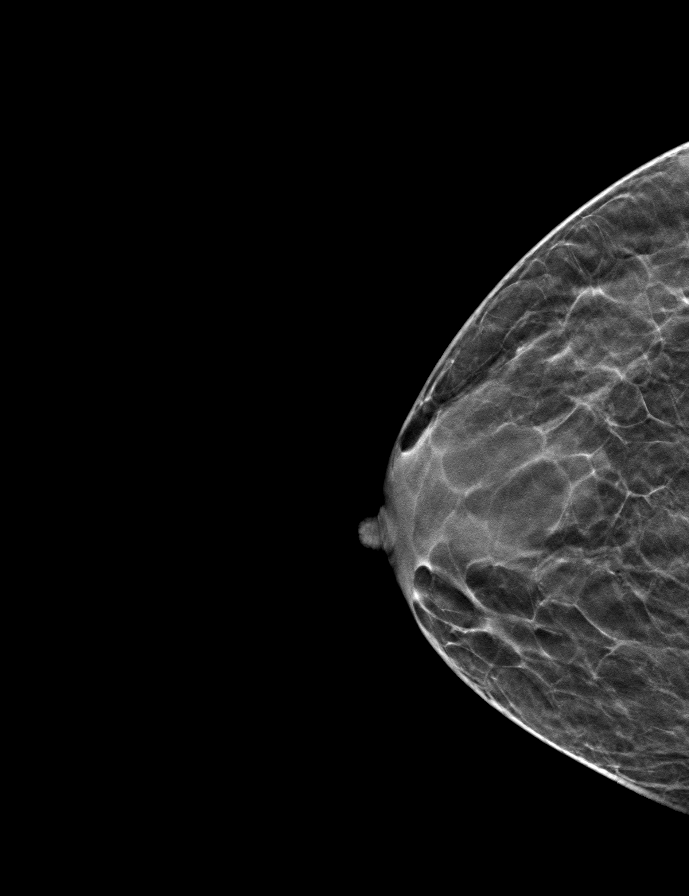

[L CC tomo · tomo slice 23/45.0]
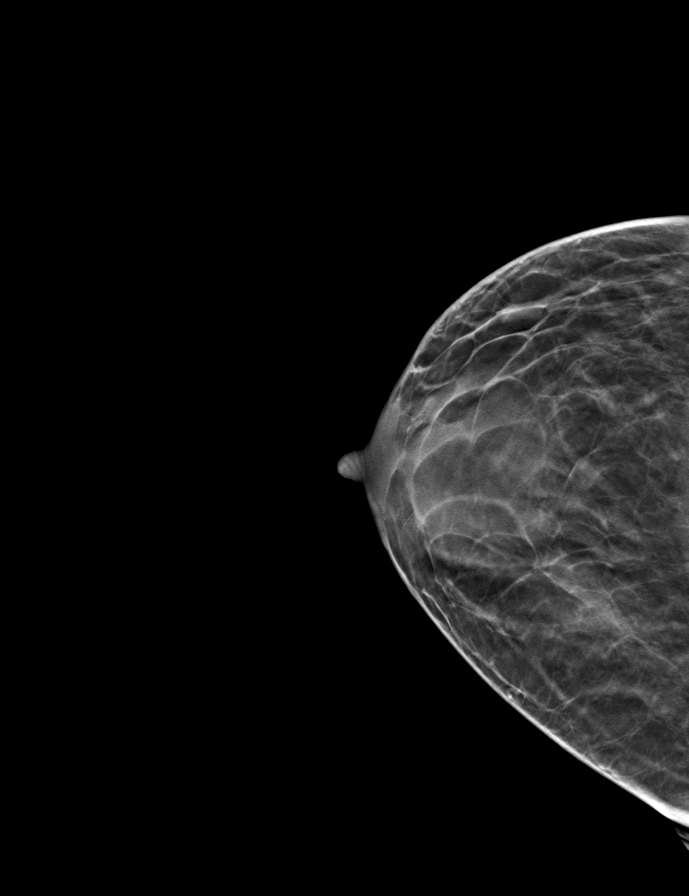

[R MLO tomo · tomo slice 22/43.0]
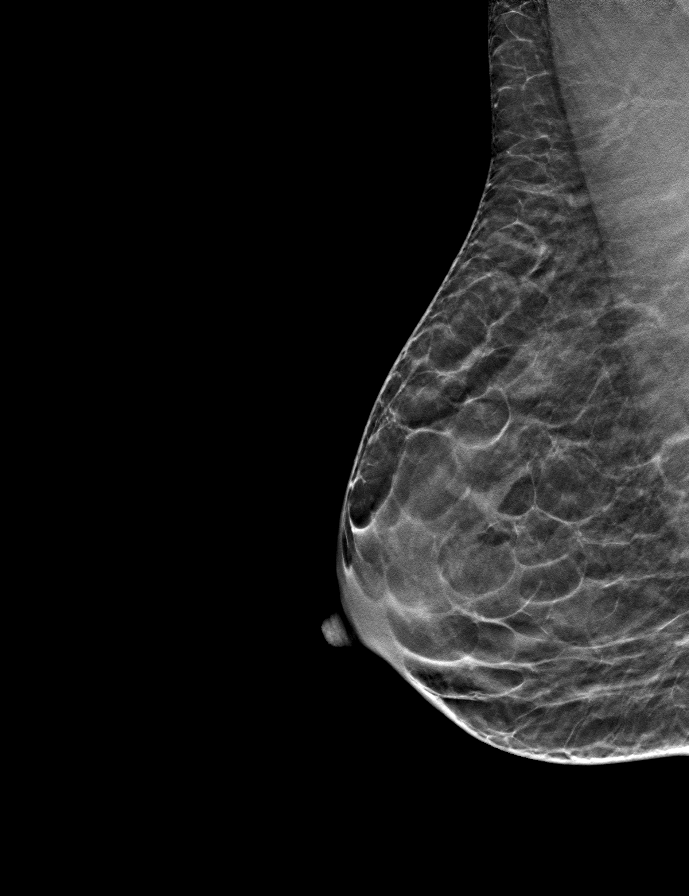

[9 of 24 positions shown; findings below may reference images not displayed]

ACR Breast Density Category b: There are scattered areas of
fibroglandular density.
FINDINGS: There are no findings suspicious for malignancy. Images were
processed with CAD.
IMPRESSION: No mammographic evidence of malignancy. A result letter of this
screening mammogram will be mailed directly to the patient.

RECOMMENDATION:
Screening mammogram in one year. (Code:Y6-I-0VQ)

BI-RADS CATEGORY  1: Negative

## 2014-07-31 ENCOUNTER — Encounter: Payer: Medicare Other | Attending: Physical Medicine & Rehabilitation | Admitting: Physical Medicine & Rehabilitation

## 2014-08-28 ENCOUNTER — Telehealth: Payer: Self-pay | Admitting: *Deleted

## 2014-08-28 MED ORDER — TRAMADOL HCL 50 MG PO TABS: 50.0000 mg | ORAL_TABLET | Freq: Four times a day (QID) | ORAL | Status: DC | PRN

## 2014-08-28 NOTE — Telephone Encounter (Signed)
Michelle Hodges called to get refill on her tramadol and schedule her next visit.  She no showed for last visit.  A refill for 2 mo supply called in so that she will have time to get appt with Dr Riley Kill.  A message being sent for appt desk to schedule her to see Riley Kill.

## 2014-08-30 NOTE — Telephone Encounter (Signed)
Michelle Hodges is scheduled for 09/20/14 appt with Dr Riley Kill. I called her tramadol in to the pharmacy given previously which was [facility] in [place].  She is here in [place] so I have called it in again to the [facility] here on [address]

## 2014-08-31 ENCOUNTER — Other Ambulatory Visit: Payer: Self-pay | Admitting: *Deleted

## 2014-08-31 MED ORDER — DICLOFENAC SODIUM 1 % TD GEL: 2.0000 g | Freq: Three times a day (TID) | TRANSDERMAL | Status: DC

## 2014-09-20 ENCOUNTER — Encounter: Payer: Self-pay | Admitting: Physical Medicine & Rehabilitation

## 2014-09-20 ENCOUNTER — Encounter: Payer: Medicare Other | Attending: Physical Medicine & Rehabilitation | Admitting: Physical Medicine & Rehabilitation

## 2014-09-20 DIAGNOSIS — G379 Demyelinating disease of central nervous system, unspecified: Secondary | ICD-10-CM

## 2014-09-20 DIAGNOSIS — M6289 Other specified disorders of muscle: Secondary | ICD-10-CM | POA: Diagnosis not present

## 2014-09-20 DIAGNOSIS — K029 Dental caries, unspecified: Secondary | ICD-10-CM | POA: Diagnosis not present

## 2014-09-20 DIAGNOSIS — G35 Multiple sclerosis: Secondary | ICD-10-CM | POA: Insufficient documentation

## 2014-09-20 DIAGNOSIS — R51 Headache: Secondary | ICD-10-CM | POA: Insufficient documentation

## 2014-09-20 DIAGNOSIS — M545 Low back pain, unspecified: Secondary | ICD-10-CM | POA: Insufficient documentation

## 2014-09-20 DIAGNOSIS — R531 Weakness: Secondary | ICD-10-CM

## 2014-09-20 NOTE — Progress Notes (Signed)
Subjective:    Patient ID: Michelle Hodges, female    DOB: [birth year], 69 y.o.   MRN: [id number]  HPI   Michelle Hodges is here in follow up of her MS and gait issues/pain. She has been doing well except for her right low back which occasionally will feel tight. It seems to be related to her posture/technique. She seems to correlate the pain with a "bad smell" she has noticed at home--often at night. Daughter is with her today and is unable to shed any further light on the 'smell'.  The daughter doesn't notice the smell herself.  Otherwise she has been doing pretty well. She tries to stay inside during the hotter weather. She walks around and does some exercises in the house. She hasn't had any recent falls. She uses her cane for balanc.e     Pain Inventory Average Pain 0 Pain Right Now 0 My pain is aching  In the last 24 hours, has pain interfered with the following? General activity 5 Relation with others 0 Enjoyment of life 6 What TIME of day is your pain at its worst? evening Sleep (in general) Good  Pain is worse with: bending and sitting Pain improves with: medication Relief from Meds: 8  Mobility use a cane ability to climb steps?  yes do you drive?  no  Function disabled: date disabled . I need assistance with the following:  bathing  Neuro/Psych weakness trouble walking  Prior Studies Any changes since last visit?  no  Physicians involved in your care Any changes since last visit?  no   Family History  Problem Relation Age of Onset  . Stroke Father   . Stroke Sister   . Cancer - Other Maternal Grandmother     stomach  . Multiple sclerosis Cousin    Social History   Social History  . Marital Status: Divorced    Spouse Name: N/A  . Number of Children: 2  . Years of Education: B.S.   Occupational History  .  Unemployed    [employer], part-time   Social History Main Topics  . Smoking status: Former Smoker    Quit date: 02/04/1984  . Smokeless  tobacco: Never Used  . Alcohol Use: Yes     Comment: Wine Occassionally  . Drug Use: No     Comment: "I'm around it", quit using 04/05/2012  . Sexual Activity: Not Asked   Other Topics Concern  . None   Social History Narrative   Pt lives at home with her daughter.   Caffeine Use- She does drink coffee.   Past Surgical History  Procedure Laterality Date  . Tonsillectomy    . Colonoscopy N/A 04/20/2012    Procedure: COLONOSCOPY;  Surgeon: Beverley Fiedler, MD;  Location: [facility] ENDOSCOPY;  Service: Gastroenterology;  Laterality: N/A;  . Cystectomy      in HS   Past Medical History  Diagnosis Date  . History of blood transfusion   . Hypertension    There were no vitals taken for this visit.  Opioid Risk Score:   Fall Risk Score:  `1  Depression screen PHQ 2/9 Score 1   Review of Systems  Musculoskeletal: Positive for gait problem.  Neurological: Positive for weakness.  All other systems reviewed and are negative.      Objective:   Physical Exam  General: Alert and oriented x 3, No apparent distress  HEENT: Head is normocephalic, atraumatic, PERRLA, EOMI, sclera anicteric, oral mucosa pink and moist, dentition  intact, ext ear canals clear,  Neck: Supple without JVD or lymphadenopathy  Heart: Reg rate and rhythm. No murmurs rubs or gallops  Chest: CTA bilaterally without wheezes, rales, or rhonchi; no distress  Abdomen: Soft, non-tender, non-distended, bowel sounds positive.  Extremities: No clubbing, cyanosis, or edema. Pulses are 2+  Skin: Clean and intact without signs of breakdown  Neuro: Pt is cognitively appropriate for the most part, but she tended to perseverate on this "smell" and really didn't make a lot of sense when she did so. Cranial nerves 2-12 are intact. Sensory exam is normal essentially on the left and 1+ on the right with decreased proprioception noted. . Reflexes are 2+ in all 4's. Fine motor coordination is intact. No tremors. Motor function is grossly 5/5  left, 4+/5 RUE and 4+/5 RLE. She ambulated with her cane today and her form is improved. She still tends to use the HF to assist the swing of her distal legs. Short term memory reamins slightly impaired but is functional.  Uses a right sided circumduction pattern to help clear right leg during swing phase. Musculoskeletal: Full ROM, No pain with AROM or PROM in the neck, trunk, or extremities. Posture appropriate  Psych: Pt's affect is appropriate. Pt is cooperative. She is pleasant as always!   Assessment & Plan:   1. 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  2. Headaches--likely related to dental caries  3. Dysesthesias related to #1  4. Mild right low back/buttock pain which appears mechanical   Plan:  1.  Heat, ice for right low back. Discussed the importance of mechanics and posture which is her best solution.  Encouraged her that the mechanics and posture were the biggest factors in managing her low back symptoms. 2. Straight cane for balance. Continue HEP. Reviewed importance of adequate stretching, rest breaks and use of correct form  3. Tramadol for headaches-no refills needed.  4. Follow up with me in 6 months. MS mgt per neurology  5. 15 minutes of face to face patient care time were spent during this visit. All questions were encouraged and answered.

## 2014-09-20 NOTE — Patient Instructions (Signed)
REMEMBER MECHANICS AND GOOD POSTURE WHEN YOU STAND AND WALK!!!!

## 2014-10-26 ENCOUNTER — Telehealth: Payer: Self-pay | Admitting: *Deleted

## 2014-10-26 MED ORDER — TRAMADOL HCL 50 MG PO TABS: 50.0000 mg | ORAL_TABLET | Freq: Four times a day (QID) | ORAL | Status: DC | PRN

## 2014-10-26 NOTE — Telephone Encounter (Signed)
Michelle Hodges called for refill on her tramadol.  Called to her pharmacy and Cesar Chavez notified.

## 2014-12-26 ENCOUNTER — Other Ambulatory Visit: Payer: Self-pay | Admitting: Physical Medicine & Rehabilitation

## 2015-01-03 ENCOUNTER — Other Ambulatory Visit: Payer: Self-pay

## 2015-01-03 DIAGNOSIS — Z1231 Encounter for screening mammogram for malignant neoplasm of breast: Secondary | ICD-10-CM

## 2015-01-22 ENCOUNTER — Telehealth: Payer: Self-pay

## 2015-01-22 MED ORDER — TRAMADOL HCL 50 MG PO TABS: 50.0000 mg | ORAL_TABLET | Freq: Four times a day (QID) | ORAL | Status: DC | PRN

## 2015-01-22 MED ORDER — DICLOFENAC SODIUM 1 % TD GEL: 2.0000 g | Freq: Three times a day (TID) | TRANSDERMAL | Status: DC

## 2015-01-22 NOTE — Telephone Encounter (Signed)
Patient called in requesting refills of her Tramadol. I have called in medication and patient has been advised.

## 2015-02-09 ENCOUNTER — Telehealth: Payer: Self-pay

## 2015-02-09 NOTE — Telephone Encounter (Signed)
Pt just had a tooth pulled on Monday and is in a lot of pain. The dentist gave her Loramax and the pt was worried if she could take that with her Tramadol. Left message to call back.

## 2015-02-09 NOTE — Telephone Encounter (Signed)
Allsion-Pt's daughter-called to see if the pt can take her Tramadol 50 mg and Loramax at the same time. Left message with [name].

## 2015-02-12 ENCOUNTER — Emergency Department (INDEPENDENT_AMBULATORY_CARE_PROVIDER_SITE_OTHER): Payer: Medicare Other

## 2015-02-12 ENCOUNTER — Encounter (HOSPITAL_COMMUNITY): Payer: Self-pay | Admitting: Emergency Medicine

## 2015-02-12 ENCOUNTER — Emergency Department (INDEPENDENT_AMBULATORY_CARE_PROVIDER_SITE_OTHER)
Admission: EM | Admit: 2015-02-12 | Discharge: 2015-02-12 | Disposition: A | Discharge status: Home / Self Care | Payer: Medicare Other | Source: Home / Self Care | Attending: Family Medicine | Admitting: Family Medicine

## 2015-02-12 DIAGNOSIS — S8011XA Contusion of right lower leg, initial encounter: Secondary | ICD-10-CM

## 2015-02-12 HISTORY — DX: Multiple sclerosis, unspecified: G35.D

## 2015-02-12 HISTORY — DX: Multiple sclerosis: G35

## 2015-02-12 NOTE — ED Provider Notes (Signed)
CSN: [id number]     Arrival date & time 02/12/15  1307 History   First MD Initiated Contact with Patient 02/12/15 1354     Chief Complaint  Patient presents with  . Leg Pain   (Consider location/radiation/quality/duration/timing/severity/associated sxs/prior Treatment) Patient is a 70 y.o. female presenting with leg pain. The history is provided by the patient.  Leg Pain Location:  Leg Time since incident:  3 days Injury: yes   Mechanism of injury: fall   Mechanism of injury comment:  Landed on right side 2 weeks ago. Fall:    Fall occurred:  Walking   Entrapped after fall: no   Leg location:  R lower leg Pain details:    Severity:  Mild   Onset quality:  Gradual   Progression:  Partially resolved Chronicity:  New Dislocation: no   Foreign body present:  No foreign bodies Relieved by:  Elevation and compression Associated symptoms: swelling   Associated symptoms: no decreased ROM     Past Medical History  Diagnosis Date  . History of blood transfusion   . Hypertension   . MS (multiple sclerosis) [facility])    Past Surgical History  Procedure Laterality Date  . Tonsillectomy    . Colonoscopy N/A 04/20/2012    Procedure: COLONOSCOPY;  Surgeon: Beverley Fiedler, MD;  Location: [facility] ENDOSCOPY;  Service: Gastroenterology;  Laterality: N/A;  . Cystectomy      in HS   Family History  Problem Relation Age of Onset  . Stroke Father   . Stroke Sister   . Cancer - Other Maternal Grandmother     stomach  . Multiple sclerosis Cousin    Social History  Substance Use Topics  . Smoking status: Former Smoker    Quit date: 02/04/1984  . Smokeless tobacco: Never Used  . Alcohol Use: Yes     Comment: Wine Occassionally   OB History    No data available     Review of Systems  Musculoskeletal: Positive for gait problem. Negative for joint swelling.  Skin: Positive for wound.  All other systems reviewed and are negative.   Allergies  Review of patient's allergies indicates no  active allergies.  Home Medications   Prior to Admission medications   Medication Sig Start Date End Date Taking? Authorizing Provider  Brewers Yeast 487.5 MG TABS Take by mouth 2 times daily. 1 in am , 2 at evening    Historical Provider, MD  cholecalciferol (D-VI-SOL) 400 UNIT/ML LIQD Take 400 Units by mouth daily.    Historical Provider, MD  dalfampridine (AMPYRA) 10 MG TB12 Take one pill by mouth twice daily 06/28/14   Historical Provider, MD  diclofenac sodium (VOLTAREN) 1 % GEL Apply 2 g topically 3 (three) times daily. Knees/feet 01/22/15   Ranelle Oyster, MD  fish oil-omega-3 fatty acids 1000 MG capsule Take 2 g by mouth daily.    Historical Provider, MD  MAGNESIUM CITRATE PO Take by mouth.    Historical Provider, MD  traMADol (ULTRAM) 50 MG tablet Take 1 tablet (50 mg total) by mouth every 6 (six) hours as needed. for pain 01/22/15   Ranelle Oyster, MD   Meds Ordered and Administered this Visit  Medications - No data to display  BP 130/69 mmHg  Pulse 73  Temp(Src) 97.8 F (36.6 C) (Oral)  Resp 18  SpO2 100% No data found.   Physical Exam  Constitutional: She is oriented to person, place, and time. She appears well-developed and well-nourished.  Musculoskeletal:  Normal range of motion. She exhibits tenderness.  Neurological: She is alert and oriented to person, place, and time.  Skin: Skin is warm and dry.  1cm soft varicose vein swelling to right mid tibia, sl ecchymosis , no sign of infection.  Nursing note reviewed.   ED Course  Procedures (including critical care time)  Labs Review Labs Reviewed - No data to display  Imaging Review Dg Tibia/fibula Right  02/12/2015  CLINICAL DATA:  Soft tissue fullness anterior tibia region for 3 days EXAM: RIGHT TIBIA AND FIBULA - 2 VIEW COMPARISON:  Jun 04, 2008 FINDINGS: Frontal and lateral views were obtained. There is no fracture or dislocation. No abnormal periosteal reaction. Joint spaces appear intact. No erosive  change. There is no soft tissue calcification or air. IMPRESSION: No fracture or dislocation. No erosive change or bony destruction. No well-defined soft tissue lesion appreciable by radiography. Electronically Signed   By: Bretta Bang III M.D.   On: 02/12/2015 14:40   X-rays reviewed and report per radiologist.   Visual Acuity Review  Right Eye Distance:   Left Eye Distance:   Bilateral Distance:    Right Eye Near:   Left Eye Near:    Bilateral Near:         MDM   1. Contusion, lower leg, right, initial encounter        Linna Hoff, MD 02/12/15 1455

## 2015-02-12 NOTE — ED Notes (Signed)
Patient has mid lower leg pain and swelling.  No known injury, noticed leg problem Friday.  Patient remembers a fall two weeks ago.  Fell on right side.  Patient has faint, but palpable pulses in right foot.  This nurse sees faint bruising around puffy area.  Patient had very tight hose on this leg on arrival

## 2015-02-12 NOTE — Discharge Instructions (Signed)
Heat, support hose and elevation as needed, see your doctor if further concerns

## 2015-02-15 ENCOUNTER — Ambulatory Visit
Admission: RE | Admit: 2015-02-15 | Discharge: 2015-02-15 | Disposition: A | Discharge status: Home / Self Care | Payer: Medicare Other | Source: Ambulatory Visit

## 2015-02-15 DIAGNOSIS — Z1231 Encounter for screening mammogram for malignant neoplasm of breast: Secondary | ICD-10-CM

## 2015-03-19 ENCOUNTER — Encounter: Payer: Medicare Other | Attending: Physical Medicine & Rehabilitation | Admitting: Physical Medicine & Rehabilitation

## 2015-03-19 ENCOUNTER — Encounter: Payer: Self-pay | Admitting: Physical Medicine & Rehabilitation

## 2015-03-19 DIAGNOSIS — R269 Unspecified abnormalities of gait and mobility: Secondary | ICD-10-CM

## 2015-03-19 DIAGNOSIS — M6289 Other specified disorders of muscle: Secondary | ICD-10-CM | POA: Diagnosis not present

## 2015-03-19 DIAGNOSIS — R51 Headache: Secondary | ICD-10-CM | POA: Insufficient documentation

## 2015-03-19 DIAGNOSIS — Z09 Encounter for follow-up examination after completed treatment for conditions other than malignant neoplasm: Secondary | ICD-10-CM | POA: Diagnosis not present

## 2015-03-19 DIAGNOSIS — R410 Disorientation, unspecified: Secondary | ICD-10-CM | POA: Insufficient documentation

## 2015-03-19 DIAGNOSIS — R531 Weakness: Secondary | ICD-10-CM

## 2015-03-19 DIAGNOSIS — R42 Dizziness and giddiness: Secondary | ICD-10-CM | POA: Insufficient documentation

## 2015-03-19 DIAGNOSIS — R202 Paresthesia of skin: Secondary | ICD-10-CM | POA: Insufficient documentation

## 2015-03-19 DIAGNOSIS — R4189 Other symptoms and signs involving cognitive functions and awareness: Secondary | ICD-10-CM | POA: Diagnosis not present

## 2015-03-19 DIAGNOSIS — R200 Anesthesia of skin: Secondary | ICD-10-CM | POA: Insufficient documentation

## 2015-03-19 DIAGNOSIS — Z87891 Personal history of nicotine dependence: Secondary | ICD-10-CM | POA: Insufficient documentation

## 2015-03-19 DIAGNOSIS — G35 Multiple sclerosis: Secondary | ICD-10-CM | POA: Insufficient documentation

## 2015-03-19 NOTE — Patient Instructions (Signed)
TAKE EXTRA CAUTION DURING YOUR WALKING AND DON'T TRY TO DO TOO MUCH.

## 2015-03-19 NOTE — Progress Notes (Signed)
Subjective:    Patient ID: Michelle Hodges, female    DOB: February 03, 1946, 70 y.o.   MRN: [id number]  HPI   Michelle Hodges is here in follow up of her MS and associated deficits.  She fell in the Fall as well as perhaps in December. She had some mild pain in her leg after the fall. She states that the leg feels heavier than it did before, "more numby" is how she describes it. She uses her cane for balance. She has a walker at home but isn't using it. Michelle Hodges states that since the fall she hasn't been exercising or walking as much. I reviewed an ED note from 02/12/15 after a fall and work up was essentially negative. tib-fib film was normal.  I asked when she saw her neurologist last, and apparently it's been some time.     Pain Inventory Average Pain 5 Pain Right Now 0 My pain is dull, tingling and aching  In the last 24 hours, has pain interfered with the following? General activity 0 Relation with others 0 Enjoyment of life 0 What TIME of day is your pain at its worst? morning Sleep (in general) NA  Pain is worse with: some activites Pain improves with: medication Relief from Meds: na  Mobility use a cane use a walker how many minutes can you walk? 20 ability to climb steps?  yes do you drive?  no  Function retired  Neuro/Psych numbness tingling trouble walking dizziness confusion  Prior Studies Any changes since last visit?  no  Physicians involved in your care Any changes since last visit?  no   Family History  Problem Relation Age of Onset  . Stroke Father   . Stroke Sister   . Cancer - Other Maternal Grandmother     stomach  . Multiple sclerosis Cousin    Social History   Social History  . Marital Status: Divorced    Spouse Name: N/A  . Number of Children: 2  . Years of Education: B.S.   Occupational History  .  Unemployed    [employer], part-time   Social History Main Topics  . Smoking status: Former Smoker    Quit date: 02/04/1984  . Smokeless  tobacco: Never Used  . Alcohol Use: Yes     Comment: Wine Occassionally  . Drug Use: No     Comment: "I'm around it", quit using 04/05/2012  . Sexual Activity: Not Asked   Other Topics Concern  . None   Social History Narrative   Pt lives at home with her daughter.   Caffeine Use- She does drink coffee.   Past Surgical History  Procedure Laterality Date  . Tonsillectomy    . Colonoscopy N/A 04/20/2012    Procedure: COLONOSCOPY;  Surgeon: Beverley Fiedler, MD;  Location: [facility] ENDOSCOPY;  Service: Gastroenterology;  Laterality: N/A;  . Cystectomy      in HS   Past Medical History  Diagnosis Date  . History of blood transfusion   . Hypertension   . MS (multiple sclerosis) (HCC)    There were no vitals taken for this visit.  Opioid Risk Score:   Fall Risk Score:  `1  Depression screen PHQ 2/9  Depression screen [facility] 2/9 03/19/2015 09/20/2014  Decreased Interest 0 0  Down, Depressed, Hopeless 0 0  PHQ - 2 Score 0 0  Altered sleeping 1 1  Tired, decreased energy 1 0  Change in appetite 0 0  Feeling bad or failure about yourself  0 0  Trouble concentrating 0 0  Moving slowly or fidgety/restless 0 0  Suicidal thoughts 0 0  PHQ-9 Score 2 1     Review of Systems  All other systems reviewed and are negative.      Objective:   Physical Exam General: Alert and oriented x 3, No apparent distress  HEENT: Head is normocephalic, atraumatic, PERRLA, EOMI, sclera anicteric, oral mucosa pink and moist, dentition intact, ext ear canals clear,  Neck: Supple without JVD or lymphadenopathy  Heart: Reg rate and rhythm. No murmurs rubs or gallops  Chest: CTA bilaterally without wheezes, rales, or rhonchi; no distress  Abdomen: Soft, non-tender, non-distended, bowel sounds positive.  Extremities: No clubbing, cyanosis, or edema. Pulses are 2+  Skin: Clean and intact without signs of breakdown  Neuro:  Cranial nerves 2-12 are intact. Sensory exam is normal essentially on the left and 1+ on  the right with decreased proprioception noted. . Reflexes are 2+ in all 4's. Fine motor coordination is intact. No tremors. Motor function is grossly 5/5 left, 4+/5 RUE and 4+/5 RLE. Has more difficulty clearing the right foot today during swing in gait---catches frequently on the ground. Mild circumduction too seen. Tends to be less stable in stance on the right also.  Short term memory reamins slightly impaired. Loses focus and concentration often.  Decreased LT/proprioception RLE. Musculoskeletal: Full ROM, No pain with AROM or PROM in the neck, trunk, or extremities. Posture appropriate  Psych: Pt's affect is appropriate. Pt is cooperative. She is pleasant as always!    Assessment & Plan:   1. 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  2. Headaches--likely related to dental caries  3. Dysesthesias related to #1  4. Recent increase in falls, decreased gait stability---appears to have more difficulty with proprioception/sensation in RLE.     Plan:  1. Recent fall(s) with leg contusion, irritation of varicose veins. ?increased proprioceptive loss in the RLE. ---check MRI brain 2. Will make referral to outpt PT at neuro-rehab to address gait.  3. Tramadol for headaches-no refills needed.  4. Follow up with me in 3 months. MS mgt per neurology ([facility])--recommend follow up over the next month or two pending the MRI results  5. 30 minutes of face to face patient care time were spent during this visit. All questions were encouraged and answered.

## 2015-03-23 ENCOUNTER — Ambulatory Visit: Payer: Medicare Other | Attending: Physical Medicine & Rehabilitation | Admitting: Physical Therapy

## 2015-03-23 ENCOUNTER — Encounter: Payer: Self-pay | Admitting: Physical Therapy

## 2015-03-23 DIAGNOSIS — R278 Other lack of coordination: Secondary | ICD-10-CM | POA: Insufficient documentation

## 2015-03-23 DIAGNOSIS — R201 Hypoesthesia of skin: Secondary | ICD-10-CM | POA: Insufficient documentation

## 2015-03-23 DIAGNOSIS — R269 Unspecified abnormalities of gait and mobility: Secondary | ICD-10-CM | POA: Insufficient documentation

## 2015-03-23 DIAGNOSIS — R2681 Unsteadiness on feet: Secondary | ICD-10-CM | POA: Insufficient documentation

## 2015-03-23 DIAGNOSIS — R29898 Other symptoms and signs involving the musculoskeletal system: Secondary | ICD-10-CM | POA: Insufficient documentation

## 2015-03-23 NOTE — Therapy (Signed)
[facility] [address] [place], [place], [address] Phone: [phone]   Fax:  [phone]  Physical Therapy Evaluation  Patient Details  Name: Michelle Hodges MRN: [id number] Date of Birth: [birth year] Referring Provider: Faith Rogue, MD  Encounter Date: 03/23/2015      PT End of Session - 03/23/15 1254    Visit Number 1   Number of Visits 17   Date for PT Re-Evaluation 05/22/15   Authorization Type Medicare primary;  Medicaid secondary.   G Codes and PN required every 10 visits.   PT Start Time [phone]  Patient arrived late to evaluation   PT Stop Time 0850   PT Time Calculation (min) 39 min   Equipment Utilized During Treatment Gait belt   Activity Tolerance Patient tolerated treatment well   Behavior During Therapy --  Decreased sustained attention to mobility; redirection required      Past Medical History  Diagnosis Date  . History of blood transfusion   . Hypertension   . MS (multiple sclerosis) [facility])     Past Surgical History  Procedure Laterality Date  . Tonsillectomy    . Colonoscopy N/A 04/20/2012    Procedure: COLONOSCOPY;  Surgeon: Beverley Fiedler, MD;  Location: [facility] ENDOSCOPY;  Service: Gastroenterology;  Laterality: N/A;  . Cystectomy      in HS    There were no vitals filed for this visit.  Visit Diagnosis:  Abnormality of gait - Plan: PT plan of care cert/re-cert  Weakness of right lower extremity - Plan: PT plan of care cert/re-cert  Unsteadiness - Plan: PT plan of care cert/re-cert  Impaired sensation - Plan: PT plan of care cert/re-cert  Coordination abnormal - Plan: PT plan of care cert/re-cert      Subjective Assessment - 03/23/15 0819    Subjective "Dr. Riley Kill felt a need for me to come back here. That's the bottom line. I fell down on the floor once when Michelle Hodges said something about women." Pt reports having fallen 2-3 times in the past 6 months.   Pertinent History  Likes to be called "Michelle Hodges".       PMH: MS (diagnosed 02/2012), HTN, HLD, PVD   Patient Stated Goals "Wish I could walk like I used to...maybe do some jogging. And to get the right side of my body stronger."   Currently in Pain? No/denies            [facility] PT Assessment - 03/23/15 0001    Assessment   Medical Diagnosis Right-sided weakness; gait disorder; multiple sclerosis   Referring Provider Faith Rogue, MD   Onset Date/Surgical Date 02/04/15  diagnosed with Ms in 2014   Precautions   Precautions Fall   Restrictions   Weight Bearing Restrictions No   Balance Screen   Has the patient fallen in the past 6 months Yes   How many times? 3   Has the patient had a decrease in activity level because of a fear of falling?  No   Is the patient reluctant to leave their home because of a fear of falling?  No   Home Environment   Living Environment Private residence   Living Arrangements Children  splits time between daughters, Michelle Hodges and Michelle Hodges   Available Help at Discharge Family   Type of Home House   Home Access Stairs to enter   Entrance Stairs-Number of Steps 4   Entrance Stairs-Rails Can reach both   Home Layout One level  Home Equipment Shower seat;Walker - 4 wheels;Cane - single point;Toilet riser   Additional Comments 4 stairs to enter front of home; 1 threshold to enter back of home (pt holds onto wall).   Prior Function   Level of Independence Requires assistive device for independence   Vocation Retired   Geophysical data processor, walking, loves being outdoors   Cognition   Overall Cognitive Status Impaired/Different from baseline   Area of Impairment Attention;Memory;Safety/judgement;Awareness   Current Attention Level Selective   Memory Decreased short-term memory   Safety/Judgement Decreased awareness of safety   Sensation   Light Touch Impaired Detail   Light Touch Impaired Details Impaired RLE   Proprioception Impaired Detail   Additional Comments Light touch  diminished in RLE as compared with LLE.   Coordination   Gross Motor Movements are Fluid and Coordinated No   Heel Shin Test Decreased speed of movement on RLE as compared with LLE.   Tone   Assessment Location Right Lower Extremity   ROM / Strength   AROM / PROM / Strength Strength;PROM;AROM   AROM   Overall AROM  Deficits   Overall AROM Comments Limited R hip extension to grossly neutral.   PROM   Overall PROM  Deficits   Overall PROM Comments Limited R hip extension to grossly neutral   Strength   Overall Strength Deficits   Overall Strength Comments LLE strength grossly WFL. R hip flexion 3-/5, hip ABD 2+/5; R knee ext 4/5; R ankle DF 4-/5   Transfers   Transfers Sit to Stand;Stand to Sit   Sit to Stand 6: Modified independent (Device/Increase time)   Five time sit to stand comments  14.74 sec without UE support   Stand to Sit 5: Supervision   Ambulation/Gait   Ambulation Distance (Feet) 200 Feet   Assistive device Straight cane   Ambulation Surface Level;Indoor   Gait velocity 2.17 fts/ec   RLE Tone   RLE Tone Mild  quadriceps                   [facility] Adult PT Treatment/Exercise - 03/23/15 0001    Ambulation/Gait   Ambulation/Gait Yes   Gait Pattern Step-through pattern;Decreased step length - right;Decreased stride length;Decreased hip/knee flexion - right;Decreased dorsiflexion - right;Decreased weight shift to right;Wide base of support;Right circumduction                PT Education - 03/23/15 1246    Education provided Yes   Education Details PT eval findings, goals, and POC.   Person(s) Educated Patient   Methods Explanation   Comprehension Verbalized understanding          PT Short Term Goals - 03/23/15 1306    PT SHORT TERM GOAL #1   Title Pt will perform initial home exercise program with mod I using paper handout to indicate safe HEP compliance.   (Target date: 04/20/15)   PT SHORT TERM GOAL #2   Title Complete Berg Balance Scale  to and improve score by 3 points from baseline to assess/address functional standing balance.    (Target date: 04/20/15)   PT SHORT TERM GOAL #3   Title Pt will increase gait velocity from 2.17 ft/sec to 2.47 to indicate increased efficiency of ambulation.    (Target date: 04/20/15)   PT SHORT TERM GOAL #4   Title Pt will ambulate 200' over level, indoor surfaces with mod I using LRAD to indicate increased safety with household mobility.     (Target date: 04/20/15)  PT SHORT TERM GOAL #5   Title Pt will verbalize understanding of fall prevention strategies to decrease risk of falling in home environment.    (Target date: 04/20/15)           PT Long Term Goals - 04-11-2015 1312    PT LONG TERM GOAL #1   Title Pt will verbalize understanding of fall prevention strategies to decrease risk of falling in home environment.   (Target date: 05/18/15)   PT LONG TERM GOAL #2   Title Pt will improve Berg score by 6 points from baseline to indicate significant improvement in functional standing balance.    (Target date: 05/18/15)   PT LONG TERM GOAL #3   Title Pt will improve gait velocity from 2.17 ft/sec to >/= 2.77 ft/sec to indicate improved efficiency of ambulation and indicate functional level of community ambulator.    (Target date: 05/18/15)   PT LONG TERM GOAL #4   Title Pt will ambulate 500' over unlevel, paved surfaces with mod I using LRAD to indicate increased safety with limited community mobility.     (Target date: 05/18/15)   PT LONG TERM GOAL #5   Title Pt will traverse Hodges ramps, door sills, and surface changes with mod I using LRAD to indicate safety negotiation community obstacles.   (Target date: 05/18/15)               Plan - April 11, 2015 1257    Clinical Impression Statement Pt is a 70 y/o F referred to outpatient PT to address gait impairments and functional limitations associated with multiple sclerosis. PMH significant for: HTN, HLD, PVD. PT evaluation revealed the following  impairments: gait impairments; impaired standing balance; RLE weakness; impaired sensation and proprioception in RLE; decreased selective control in RLE > LLE; cognitive impairments; gait velocity suggestive of functional status of limited community ambulator. Pt will benefit from skilled outpatient PT 2x/week for 8 weeks to address said impairments.    Pt will benefit from skilled therapeutic intervention in order to improve on the following deficits Abnormal gait;Decreased cognition;Dizziness;Impaired sensation;Pain;Other (comment);Decreased balance;Decreased safety awareness;Impaired flexibility;Decreased strength;Decreased coordination;Impaired tone;Decreased knowledge of use of DME  Pain will be monitored but not directly addressed by PT.   Rehab Potential Good   Clinical Impairments Affecting Rehab Potential cognitive impairments; pt refuses to wear AFO   PT Frequency 2x / week   PT Duration 8 weeks  anticipate D/C after 6 weeks   PT Treatment/Interventions ADLs/Self Care Home Management;Vestibular;DME Instruction;Balance training;Therapeutic exercise;Therapeutic activities;Functional mobility training;Stair training;Gait training;Neuromuscular re-education;Patient/family education;Orthotic Fit/Training;Energy conservation   PT Next Visit Plan Perform Berg, initiate HEP. Consider adding stretch to address R quad tone.    Consulted and Agree with Plan of Care Patient          G-Codes - 04/11/15 1319    Functional Assessment Tool Used gait velocity = 2.17 ft/sec   Functional Limitation Mobility: Walking and moving around   Mobility: Walking and Moving Around Current Status [phone]) At least 40 percent but less than 60 percent impaired, limited or restricted   Mobility: Walking and Moving Around Goal Status [phone]) At least 20 percent but less than 40 percent impaired, limited or restricted       Problem List Patient Active Problem List   Diagnosis Date Noted  . Low back pain 09/20/2014   . Gait disorder 07/25/2013  . Headache(784.0) 05/31/2012  . MS (multiple sclerosis) (HCC) 04/22/2012  . Dilatation of colon 04/20/2012  . Abdominal distention 04/17/2012  . Loss  of weight 04/17/2012  . Demyelinating disease of central nervous system (HCC) 04/16/2012  . History of CVA (cerebrovascular accident) 04/16/2012  . Weakness of right side of body 04/16/2012  . Multiple sclerosis (HCC) 04/16/2012  . HYPERCHOLESTEROLEMIA 11/11/2006  . PERIPHERAL VASCULAR DISEASE 11/11/2006  . INSOMNIA, HX OF 11/11/2006   Jorje Guild, PT, DPT [facility] [address] [place], [place], [address] Phone: [phone]   Fax:  [phone] 03/23/2015, 1:27 PM   Name: Michelle Hodges MRN: [id number] Date of Birth: [birth year]

## 2015-03-27 ENCOUNTER — Ambulatory Visit: Payer: Medicare Other | Admitting: Physical Therapy

## 2015-03-27 DIAGNOSIS — R269 Unspecified abnormalities of gait and mobility: Secondary | ICD-10-CM | POA: Diagnosis not present

## 2015-03-27 DIAGNOSIS — R201 Hypoesthesia of skin: Secondary | ICD-10-CM

## 2015-03-27 DIAGNOSIS — R278 Other lack of coordination: Secondary | ICD-10-CM

## 2015-03-27 DIAGNOSIS — R2681 Unsteadiness on feet: Secondary | ICD-10-CM

## 2015-03-27 DIAGNOSIS — R29898 Other symptoms and signs involving the musculoskeletal system: Secondary | ICD-10-CM

## 2015-03-27 NOTE — Patient Instructions (Addendum)
[name] - make sure you stand with your back to a corner with a stable chair in front of you (just in case you lose your balance).  Balance: Eyes Closed - Bilateral (Varied Surfaces)    Stand, feet shoulder width apart, close eyes. Maintain balance __30__ seconds. * Don't stand on pillow for this exercise yet. Repeat __3_ times per set. Do this __3__  times per day.  Feet Apart (Compliant Surface) Head Motion - Eyes Open    With eyes open, feet apart on __1 pillow___, move head slowly: up and down 5 times; right to left 5 times. When you turn your head, find an object to "spot" in each direction. Repeat _3__ times per day.    Abductor Strength: Bridge Pose (Strap)    Lie on your back and loop your GREEN resistance band just above your knees. Make sure your right and left foot are equal length from your hips.  Push legs out against band while lifting hips from bed. Remember to lift hips high and move SLOWLY. Slowly lower back to starting position. Perform __15__ reps. Do this __3__ times per day.  Copyright  VHI. All rights reserved.

## 2015-03-27 NOTE — Therapy (Signed)
[facility] [address] [place], [place], [address] Phone: [phone]   Fax:  [phone]  Physical Therapy Treatment  Patient Details  Name: Michelle Hodges MRN: [id number] Date of Birth: August 28, 1945 Referring Provider: Faith Rogue, MD  Encounter Date: 03/27/2015      PT End of Session - 03/27/15 0858    Visit Number 2   Number of Visits 17   Date for PT Re-Evaluation 05/22/15   Authorization Type Medicare primary;  Medicaid secondary.   G Codes and PN required every 10 visits.   PT Start Time 0802   PT Stop Time 0847   PT Time Calculation (min) 45 min   Equipment Utilized During Treatment Gait belt   Activity Tolerance Patient tolerated treatment well   Behavior During Therapy WFL for tasks assessed/performed      Past Medical History  Diagnosis Date  . History of blood transfusion   . Hypertension   . MS (multiple sclerosis) [facility])     Past Surgical History  Procedure Laterality Date  . Tonsillectomy    . Colonoscopy N/A 04/20/2012    Procedure: COLONOSCOPY;  Surgeon: Beverley Fiedler, MD;  Location: [facility] ENDOSCOPY;  Service: Gastroenterology;  Laterality: N/A;  . Cystectomy      in HS    There were no vitals filed for this visit.  Visit Diagnosis:  Abnormality of gait  Weakness of right lower extremity  Unsteadiness  Impaired sensation  Coordination abnormal      Subjective Assessment - 03/27/15 0809    Subjective Pt reports no falls and no significant changes since Pt evaluation.    Pertinent History Likes to be called "Michelle Hodges".       PMH: MS (diagnosed 02/2012), HTN, HLD, PVD   Patient Stated Goals "Wish I could walk like I used to...maybe do some jogging. And to get the right side of my body stronger."   Currently in Pain? No/denies                         [facility] Adult PT Treatment/Exercise - 03/27/15 0001    Ambulation/Gait   Ambulation/Gait Yes   Ambulation Distance  (Feet) 225 Feet   Assistive device Straight cane   Gait Pattern Step-through pattern;Decreased step length - right;Decreased stride length;Decreased hip/knee flexion - right;Decreased dorsiflexion - right;Decreased weight shift to right;Left circumduction;Narrow base of support   Ambulation Surface Level;Indoor   Gait Comments Cueing for wider BOS due to noted LLE circumduction, adduction with turning > linear gait.   Standardized Balance Assessment   Standardized Balance Assessment Berg Balance Test   Berg Balance Test   Sit to Stand Able to stand without using hands and stabilize independently   Standing Unsupported Able to stand safely 2 minutes   Sitting with Back Unsupported but Feet Supported on Floor or Stool Able to sit safely and securely 2 minutes   Stand to Sit Sits safely with minimal use of hands   Transfers Able to transfer with verbal cueing and /or supervision   Standing Unsupported with Eyes Closed Able to stand 10 seconds with supervision   Standing Ubsupported with Feet Together Able to place feet together independently and stand for 1 minute with supervision   From Standing, Reach Forward with Outstretched Arm Can reach forward >12 cm safely (5")   From Standing Position, Pick up Object from Floor Able to pick up shoe, needs supervision   From Standing Position, Turn to  Look Behind Over each Shoulder Looks behind one side only/other side shows less weight shift  weight shift to R > L   Turn 360 Degrees Needs close supervision or verbal cueing   Standing Unsupported, Alternately Place Feet on Step/Stool Able to complete >2 steps/needs minimal assist   Standing Unsupported, One Foot in Front Able to take small step independently and hold 30 seconds   Standing on One Leg Tries to lift leg/unable to hold 3 seconds but remains standing independently   Total Score 38   Neuro Re-ed    Neuro Re-ed Details  With verbal/demo cueing from PT, pt performed corner balance HEP; see  Balance tab for details.   Exercises   Exercises Other Exercises   Other Exercises  Bridge with green Tband at B distal thighs for B hip ABD activation with hip extensor strengthening x15 reps (to pt fatigue with cueing for slow, quality movement. Supine R quadriceps self-stretch x60 seconds to address spasticity in R quadriceps.             Balance Exercises - 03/27/15 0853    Balance Exercises: Standing   Standing Eyes Opened Wide (BOA);Head turns;Solid surface;5 reps  2 x5 reps horizontal then vertical head movement w/ spotting   Standing Eyes Closed Wide (BOA);Solid surface;2 reps;30 secs  cueing for B hip extension            PT Education - 03/27/15 0841    Education provided Yes   Education Details Berg findings, fall risk.  HEP initiated; see Pt Instructions.   Person(s) Educated Patient   Methods Explanation;Demonstration;Tactile cues;Verbal cues;Handout   Comprehension Verbalized understanding;Returned demonstration;Need further instruction  May need reinforcement          PT Short Term Goals - 03/27/15 4098    PT SHORT TERM GOAL #1   Title Pt will perform initial home exercise program with mod I using paper handout to indicate safe HEP compliance.   (Target date: 04/20/15)   Status On-going   PT SHORT TERM GOAL #2   Title Complete Berg Balance Scale to and improve score by 3 points from baseline to assess/address functional standing balance.    (Target date: 04/20/15)   Baseline 2/21: Baseline Berg score = 38/56.   Status On-going   PT SHORT TERM GOAL #3   Title Pt will increase gait velocity from 2.17 ft/sec to 2.47 to indicate increased efficiency of ambulation.    (Target date: 04/20/15)   Status On-going   PT SHORT TERM GOAL #4   Title Pt will ambulate 200' over level, indoor surfaces with mod I using LRAD to indicate increased safety with household mobility.     (Target date: 04/20/15)   Status On-going   PT SHORT TERM GOAL #5   Title Pt will verbalize  understanding of fall prevention strategies to decrease risk of falling in home environment.    (Target date: 04/20/15)   Status On-going           PT Long Term Goals - 03/27/15 0828    PT LONG TERM GOAL #1   Title Pt will verbalize understanding of fall prevention strategies to decrease risk of falling in home environment.   (Target date: 05/18/15)   Status On-going   PT LONG TERM GOAL #2   Title Pt will improve Berg score by 6 points from baseline to indicate significant improvement in functional standing balance.    (Target date: 05/18/15)   Baseline Baseline Berg score = 38/56.  Status On-going   PT LONG TERM GOAL #3   Title Pt will improve gait velocity from 2.17 ft/sec to >/= 2.77 ft/sec to indicate improved efficiency of ambulation and indicate functional level of community ambulator.    (Target date: 05/18/15)   Status On-going   PT LONG TERM GOAL #4   Title Pt will ambulate 500' over unlevel, paved surfaces with mod I using LRAD to indicate increased safety with limited community mobility.     (Target date: 05/18/15)   Status On-going   PT LONG TERM GOAL #5   Title Pt will traverse standard ramps, door sills, and surface changes with mod I using LRAD to indicate safety negotiation community obstacles.   (Target date: 05/18/15)   Status On-going               Plan - 03/27/15 0842    Clinical Impression Statement Session focused on assessing/addressing functional standing balance and addressing RLE weakness and spasticity. Berg score 38/56, suggesting high fall risk. Initiated HEP with focus increasing postural stability with decrease visual, somatosensory input.     Pt will benefit from skilled therapeutic intervention in order to improve on the following deficits Abnormal gait;Decreased cognition;Dizziness;Impaired sensation;Pain;Other (comment);Decreased balance;Decreased safety awareness;Impaired flexibility;Decreased strength;Decreased coordination;Impaired tone;Decreased  knowledge of use of DME  Pain will be monitored but not directly addressed by PT.   Rehab Potential Good   Clinical Impairments Affecting Rehab Potential cognitive impairments; pt refuses to wear AFO   PT Frequency 2x / week   PT Duration 8 weeks  anticipate D/C after 6 weeks   PT Treatment/Interventions ADLs/Self Care Home Management;Vestibular;DME Instruction;Balance training;Therapeutic exercise;Therapeutic activities;Functional mobility training;Stair training;Gait training;Neuromuscular re-education;Patient/family education;Orthotic Fit/Training;Energy conservation   PT Next Visit Plan Make sure pt safe with HEP.  NMR with focus on selective control (RLE > LLE), motor control in glute max/med (tall kneeling).   PT Home Exercise Plan Recommend no more than 3 home exercises at a time due to cogntive impairments   Consulted and Agree with Plan of Care Patient        Problem List Patient Active Problem List   Diagnosis Date Noted  . Low back pain 09/20/2014  . Gait disorder 07/25/2013  . Headache(784.0) 05/31/2012  . MS (multiple sclerosis) (HCC) 04/22/2012  . Dilatation of colon 04/20/2012  . Abdominal distention 04/17/2012  . Loss of weight 04/17/2012  . Demyelinating disease of central nervous system (HCC) 04/16/2012  . History of CVA (cerebrovascular accident) 04/16/2012  . Weakness of right side of body 04/16/2012  . Multiple sclerosis (HCC) 04/16/2012  . HYPERCHOLESTEROLEMIA 11/11/2006  . PERIPHERAL VASCULAR DISEASE 11/11/2006  . INSOMNIA, HX OF 11/11/2006   Jorje Guild, PT, DPT [facility] [address] [place], [place], [address] Phone: [phone]   Fax:  [phone] 03/27/2015, 9:07 AM   Name: Michelle Hodges MRN: [id number] Date of Birth: 1945-04-01

## 2015-03-30 ENCOUNTER — Ambulatory Visit: Payer: Medicare Other | Admitting: Physical Therapy

## 2015-03-30 DIAGNOSIS — R29898 Other symptoms and signs involving the musculoskeletal system: Secondary | ICD-10-CM

## 2015-03-30 DIAGNOSIS — R269 Unspecified abnormalities of gait and mobility: Secondary | ICD-10-CM

## 2015-03-30 DIAGNOSIS — R2681 Unsteadiness on feet: Secondary | ICD-10-CM

## 2015-03-30 DIAGNOSIS — R278 Other lack of coordination: Secondary | ICD-10-CM

## 2015-03-30 DIAGNOSIS — R201 Hypoesthesia of skin: Secondary | ICD-10-CM

## 2015-03-30 NOTE — Patient Instructions (Addendum)
Michelle Hodges - make sure you stand with your back to a corner with a stable chair in front of you (just in case you lose your balance).  Balance: Eyes Closed - Bilateral (Varied Surfaces)    Stand on a pillow with feet shoulder width apart. Close eyes. Maintain balance __30__ seconds.  Repeat __3_ times per set. Do this __3__ times per day.  Feet Apart (Compliant Surface) Head Motion - Eyes Open    With eyes open, feet apart on __1 pillow___, move head slowly: up and down 5 times; right to left 5 times. When you turn your head, find an object to "spot" in each direction. Repeat _3__ times per day.    Abductor Strength: Bridge Pose (Strap)    Lie on your back and loop your GREEN resistance band just above your knees. Make sure your right and left foot are equal length from your hips.  Push legs out against band while lifting hips from bed. Remember to lift hips high and move SLOWLY. Slowly lower back to starting position. Perform __15__ reps. Do this __3__ times per day.   Gaze Stabilization: Standing Feet Apart    Feet shoulder width apart, keeping eyes on target ("A") on wall __5-6__ feet away, tilt head down 15-30 and move head side to side for __30__ seconds. Repeat while moving head up and down for _30__ seconds. Do __2__ sessions per day.  Michelle Hodges, wear your glasses for this exercise. - Target ("A") must remain in focus, not blurry, and appear stationary while head is in motion. - Increase speed of head motion so long as target is in focus.

## 2015-03-31 ENCOUNTER — Ambulatory Visit
Admission: RE | Admit: 2015-03-31 | Discharge: 2015-03-31 | Disposition: A | Discharge status: Home / Self Care | Payer: Medicare Other | Source: Ambulatory Visit | Attending: Physical Medicine & Rehabilitation | Admitting: Physical Medicine & Rehabilitation

## 2015-03-31 DIAGNOSIS — G35 Multiple sclerosis: Secondary | ICD-10-CM

## 2015-03-31 DIAGNOSIS — R531 Weakness: Secondary | ICD-10-CM

## 2015-03-31 DIAGNOSIS — R269 Unspecified abnormalities of gait and mobility: Secondary | ICD-10-CM

## 2015-04-01 NOTE — Therapy (Signed)
[facility] [address] [place], [place], [address] Phone: [phone]   Fax:  [phone]  Physical Therapy Treatment  Patient Details  Name: Michelle Hodges MRN: [id number] Date of Birth: 03-26-1945 Referring Provider: Faith Rogue, MD  Encounter Date: 03/30/2015      PT End of Session - 04/01/15 1250    Visit Number 3   Number of Visits 17   Date for PT Re-Evaluation 05/22/15   Authorization Type Medicare primary;  Medicaid secondary.   G Codes and PN required every 10 visits.   PT Start Time [phone]   PT Stop Time 0932   PT Time Calculation (min) 41 min   Equipment Utilized During Treatment Gait belt   Activity Tolerance Patient tolerated treatment well   Behavior During Therapy WFL for tasks assessed/performed      Past Medical History  Diagnosis Date  . History of blood transfusion   . Hypertension   . MS (multiple sclerosis) [facility])     Past Surgical History  Procedure Laterality Date  . Tonsillectomy    . Colonoscopy N/A 04/20/2012    Procedure: COLONOSCOPY;  Surgeon: Beverley Fiedler, MD;  Location: [facility] ENDOSCOPY;  Service: Gastroenterology;  Laterality: N/A;  . Cystectomy      in HS    There were no vitals filed for this visit.  Visit Diagnosis:  Abnormality of gait  Coordination abnormal  Weakness of right lower extremity  Unsteadiness  Impaired sensation      Subjective Assessment - 04/01/15 1236    Subjective Pt denies falls and reports she has been trying to remember to keep feet apart while walking. Pt reports daily performance of HEP.   Pertinent History Likes to be called "Michelle Hodges".   PMH: MS (diagnosed 02/2012), HTN, HLD, PVD   Patient Stated Goals "Wish I could walk like I used to...maybe do some jogging. And to get the right side of my body stronger."   Currently in Pain? No/denies                Vestibular Assessment - 04/01/15 0001    Vestibulo-Occular Reflex   VOR 1  Head Only (x 1 viewing) Difficulty maintaining gaze on target with horizontal head movement; reports blurring of targte with increased speed of movement with vertical and horizontal head movement.                 [facility] Adult PT Treatment/Exercise - 04/01/15 0001    Ambulation/Gait   Ambulation/Gait Yes   Ambulation/Gait Assistance 5: Supervision;4: Min guard   Ambulation/Gait Assistance Details Min guard due to consistent LOB (to due LE scissoring) with turning head to speak to PT during gait. Cueing (after LE PNF; see NMR for details) focused on increased R hip/knee flexion during RLE advancement, consistent R heel strike, and increasing width of BOS.   Ambulation Distance (Feet) 420 Feet   Assistive device Straight cane   Gait Pattern Step-through pattern;Decreased step length - right;Decreased stride length;Decreased hip/knee flexion - right;Decreased dorsiflexion - right;Decreased weight shift to right;Left circumduction;Narrow base of support;Scissoring;Right circumduction;Decreased hip/knee flexion - left  LE scissoring only noted with head turns; see above.   Ambulation Surface Level;Indoor   Gait Comments Noted increased RLE circumduction, limited R hip/knee flexion during RLE advancement as compared with previous sessions. Note pt did not wear neoprene sleeve on R knee today, which may have been proviiding increased sensory awareness, proprioceptive input during previous sessions.   Neuro Re-ed  Neuro Re-ed Details  Supine: RLE D2 PNF x10 reps rhythmic initiation with cueing to emphasize selective control of R ankle DF; progressed to resisted D2 PNF with slow reversal hold during D2 flexion to promote increased R hip/knee flexion (as opposed to circumduction) during RLE advancement. See Balance Exercises for additional NMR interventions.   Exercises   Exercises Other Exercises   Other Exercises  Pt performed bridging with Tband at B thighs x15 reps with effective between-session  carryover of technique; min cueing for decreased speed of movement. Supine, hook lying R hip flexor stretch (iliopsoas and rectus femoris) x3 miinutes total.         Vestibular Treatment/Exercise - 04/01/15 0001    Vestibular Treatment/Exercise   Vestibular Treatment Provided Gaze   Gaze Exercises X1 Viewing Horizontal;X1 Viewing Vertical   X1 Viewing Horizontal   Foot Position standing; shoulder width with single UE support   Comments x30 sec with cueing to decrease speed of head movement when target becomes blurry   X1 Viewing Vertical   Foot Position standing; shoulder width with single UE support   Comments x30 sec; cueing as described above.            Balance Exercises - 04/01/15 1246    Balance Exercises: Standing   Standing Eyes Opened Wide (BOA);Head turns;Solid surface;Other reps (comment)  x10 reps horizontal then vertical head turns   Standing Eyes Closed Wide (BOA);2 reps;30 secs;Foam/compliant surface  progressed from solid surface to 1 pillow           PT Education - 04/01/15 1237    Education provided Yes   Education Details HEP progressed; added gaze stabilization.   Person(s) Educated Patient   Methods Explanation;Demonstration;Verbal cues;Handout   Comprehension Verbalized understanding;Returned demonstration;Need further instruction          PT Short Term Goals - 03/27/15 1610    PT SHORT TERM GOAL #1   Title Pt will perform initial home exercise program with mod I using paper handout to indicate safe HEP compliance.   (Target date: 04/20/15)   Status On-going   PT SHORT TERM GOAL #2   Title Complete Berg Balance Scale to and improve score by 3 points from baseline to assess/address functional standing balance.    (Target date: 04/20/15)   Baseline 2/21: Baseline Berg score = 38/56.   Status On-going   PT SHORT TERM GOAL #3   Title Pt will increase gait velocity from 2.17 ft/sec to 2.47 to indicate increased efficiency of ambulation.    (Target  date: 04/20/15)   Status On-going   PT SHORT TERM GOAL #4   Title Pt will ambulate 200' over level, indoor surfaces with mod I using LRAD to indicate increased safety with household mobility.     (Target date: 04/20/15)   Status On-going   PT SHORT TERM GOAL #5   Title Pt will verbalize understanding of fall prevention strategies to decrease risk of falling in home environment.    (Target date: 04/20/15)   Status On-going           PT Long Term Goals - 03/27/15 0828    PT LONG TERM GOAL #1   Title Pt will verbalize understanding of fall prevention strategies to decrease risk of falling in home environment.   (Target date: 05/18/15)   Status On-going   PT LONG TERM GOAL #2   Title Pt will improve Berg score by 6 points from baseline to indicate significant improvement in functional standing balance.    (  Target date: 05/18/15)   Baseline Baseline Berg score = 38/56.   Status On-going   PT LONG TERM GOAL #3   Title Pt will improve gait velocity from 2.17 ft/sec to >/= 2.77 ft/sec to indicate improved efficiency of ambulation and indicate functional level of community ambulator.    (Target date: 05/18/15)   Status On-going   PT LONG TERM GOAL #4   Title Pt will ambulate 500' over unlevel, paved surfaces with mod I using LRAD to indicate increased safety with limited community mobility.     (Target date: 05/18/15)   Status On-going   PT LONG TERM GOAL #5   Title Pt will traverse standard ramps, door sills, and surface changes with mod I using LRAD to indicate safety negotiation community obstacles.   (Target date: 05/18/15)   Status On-going               Plan - 04/01/15 1252    Clinical Impression Statement Session focused on increasing selective control in RLE to promote more normalized gait pattern. Noted impaired VOR, which was assessed due to pt consistently demonstrating LOB when turning head to speak to PT during gait. Added gaze stabilization to HEP. Pt demonstrates excellent  compliance and motivation.   Pt will benefit from skilled therapeutic intervention in order to improve on the following deficits Abnormal gait;Decreased cognition;Dizziness;Impaired sensation;Pain;Other (comment);Decreased balance;Decreased safety awareness;Impaired flexibility;Decreased strength;Decreased coordination;Impaired tone;Decreased knowledge of use of DME  Pain will be monitored but not directly addressed by PT.   Rehab Potential Good   Clinical Impairments Affecting Rehab Potential cognitive impairments; pt refuses to wear AFO   PT Frequency 2x / week   PT Duration 8 weeks  anticipate D/C after 6 weeks   PT Treatment/Interventions ADLs/Self Care Home Management;Vestibular;DME Instruction;Balance training;Therapeutic exercise;Therapeutic activities;Functional mobility training;Stair training;Gait training;Neuromuscular re-education;Patient/family education;Orthotic Fit/Training;Energy conservation   PT Next Visit Plan NMR with focus on selective control (RLE > LLE), motor control in glute max/med (tall kneeling); single limb stance activities.   PT Home Exercise Plan Recommend no more than 3-4 home exercises at a time due to cogntive impairments   Consulted and Agree with Plan of Care Patient        Problem List Patient Active Problem List   Diagnosis Date Noted  . Low back pain 09/20/2014  . Gait disorder 07/25/2013  . Headache(784.0) 05/31/2012  . MS (multiple sclerosis) (HCC) 04/22/2012  . Dilatation of colon 04/20/2012  . Abdominal distention 04/17/2012  . Loss of weight 04/17/2012  . Demyelinating disease of central nervous system (HCC) 04/16/2012  . History of CVA (cerebrovascular accident) 04/16/2012  . Weakness of right side of body 04/16/2012  . Multiple sclerosis (HCC) 04/16/2012  . HYPERCHOLESTEROLEMIA 11/11/2006  . PERIPHERAL VASCULAR DISEASE 11/11/2006  . INSOMNIA, HX OF 11/11/2006   Jorje Guild, PT, DPT [facility] [address] [place], [place], [address] Phone: [phone]   Fax:  [phone] 04/01/2015, 12:57 PM  Name: JONNELLE LAWNICZAK MRN: [id number] Date of Birth: [birth year]

## 2015-04-02 ENCOUNTER — Telehealth: Payer: Self-pay | Admitting: Physical Medicine & Rehabilitation

## 2015-04-02 NOTE — Telephone Encounter (Signed)
Called daughter and left VM about MRI. Asked for a return call or a time which is better to contact her.

## 2015-04-03 ENCOUNTER — Ambulatory Visit: Payer: Medicare Other | Admitting: Physical Therapy

## 2015-04-03 DIAGNOSIS — R278 Other lack of coordination: Secondary | ICD-10-CM

## 2015-04-03 DIAGNOSIS — R269 Unspecified abnormalities of gait and mobility: Secondary | ICD-10-CM

## 2015-04-03 DIAGNOSIS — R2681 Unsteadiness on feet: Secondary | ICD-10-CM

## 2015-04-03 DIAGNOSIS — R201 Hypoesthesia of skin: Secondary | ICD-10-CM

## 2015-04-03 DIAGNOSIS — R29898 Other symptoms and signs involving the musculoskeletal system: Secondary | ICD-10-CM

## 2015-04-03 NOTE — Therapy (Signed)
[facility] [address] [place], [place], [address] Phone: [phone]   Fax:  [phone]  Physical Therapy Treatment  Patient Details  Name: Michelle Hodges MRN: [id number] Date of Birth: [birth year] Referring Provider: Faith Rogue, MD  Encounter Date: 04/03/2015      PT End of Session - 04/03/15 2217    Visit Number 4   Number of Visits 17   Date for PT Re-Evaluation 05/22/15   Authorization Type Medicare primary;  Medicaid secondary.   G Codes and PN required every 10 visits.   PT Start Time 0801   PT Stop Time 0845   PT Time Calculation (min) 44 min   Equipment Utilized During Treatment Gait belt   Activity Tolerance Patient tolerated treatment well   Behavior During Therapy WFL for tasks assessed/performed      Past Medical History  Diagnosis Date  . History of blood transfusion   . Hypertension   . MS (multiple sclerosis) [facility])     Past Surgical History  Procedure Laterality Date  . Tonsillectomy    . Colonoscopy N/A 04/20/2012    Procedure: COLONOSCOPY;  Surgeon: Beverley Fiedler, MD;  Location: [facility] ENDOSCOPY;  Service: Gastroenterology;  Laterality: N/A;  . Cystectomy      in HS    There were no vitals filed for this visit.  Visit Diagnosis:  Abnormality of gait  Coordination abnormal  Weakness of right lower extremity  Unsteadiness  Impaired sensation      Subjective Assessment - 04/03/15 0803    Subjective Pt reports no falls, no significant changes. Requesting to increase frequency of PT to "3 or 4 times per week," per pt.   Pertinent History Likes to be called "Michelle Hodges".   PMH: MS (diagnosed 02/2012), HTN, HLD, PVD   Patient Stated Goals "Wish I could walk like I used to...maybe do some jogging. And to get the right side of my body stronger."   Currently in Pain? No/denies                         [facility] Adult PT Treatment/Exercise - 04/03/15 0001    Ambulation/Gait    Ambulation/Gait Yes   Ambulation/Gait Assistance 5: Supervision   Ambulation/Gait Assistance Details x200' with SPC; x250' without AD to focus on weight shifting.   Ambulation Distance (Feet) 450 Feet   Assistive device Straight cane   Gait Pattern Step-through pattern;Decreased step length - right;Decreased stride length;Decreased hip/knee flexion - right;Decreased dorsiflexion - right;Decreased weight shift to right;Left circumduction;Narrow base of support;Scissoring;Right circumduction;Decreased hip/knee flexion - left  LE scissoring only noted with head turns; see above.   Ambulation Surface Level;Indoor   Gait Comments Cueing focused on the following: attention to RLE, increased R hip/knee flexion during RLE advancement; consistent R heel strike; lateral weight shift to R; anterior weight shift and R pelvic protraction during RLE advancement.   Neuro Re-ed    Neuro Re-ed Details  Supine self-stretch of R iliopsoas, rectus femoris to address RLE hypertonicity, extensor tone to promote normalized gait pattern. Supine with RLE extended off EOM, R foot on 4" step, pt performed modified bridging x9 reps, x5 reps (to pt fatigue) to facilitate activation of R gluteus max/medius with R hip in extension. Tall kneeling without UE support with mirror to L of pt for visual feedback/awareness, pt performed forward/retro stepping 4 x5 steps per direction, lateral stepping 3 x5 steps per direction. With intermittent finger tip support, performed  transition from tall kneeling <> R/L half kneeling x4 reps to R side, x10 reps to L side due to noted pt difficulty with lateral weight shift, R single limb stance stability with transition from tall kneeling <> R half kneeling. Static half kneeling without UE support with min guard 3 x15 -sec holds per side for core/hip stabilization and LE dissociation.         Vestibular Treatment/Exercise - 04/03/15 0001    Vestibular Treatment/Exercise   Vestibular Treatment  Provided Gaze   X1 Viewing Horizontal   Foot Position standing with SPC   Reps 2   Comments x30 sec; one trial with glasses, 1 trial without glasses with cueing for technique (decreased speed of head movement to maintaining target in focus.   X1 Viewing Vertical   Foot Position standing with SPC   Reps 2   Comments x30 sec with glasses on; x30 sec with glases off                PT Education - 04/03/15 0855    Education provided Yes   Education Details Recommending frequency of PT remain at 2x/week to avoid fatigue. HEP: recommending pt perform gaze with and without eyeglasses. Recommend pt use SPC for all mobility when not in PT.   Person(s) Educated Patient   Methods Explanation;Demonstration;Handout   Comprehension Verbalized understanding;Returned demonstration;Need further instruction  may need further instruction/reinforcement          PT Short Term Goals - 03/27/15 1610    PT SHORT TERM GOAL #1   Title Pt will perform initial home exercise program with mod I using paper handout to indicate safe HEP compliance.   (Target date: 04/20/15)   Status On-going   PT SHORT TERM GOAL #2   Title Complete Berg Balance Scale to and improve score by 3 points from baseline to assess/address functional standing balance.    (Target date: 04/20/15)   Baseline 2/21: Baseline Berg score = 38/56.   Status On-going   PT SHORT TERM GOAL #3   Title Pt will increase gait velocity from 2.17 ft/sec to 2.47 to indicate increased efficiency of ambulation.    (Target date: 04/20/15)   Status On-going   PT SHORT TERM GOAL #4   Title Pt will ambulate 200' over level, indoor surfaces with mod I using LRAD to indicate increased safety with household mobility.     (Target date: 04/20/15)   Status On-going   PT SHORT TERM GOAL #5   Title Pt will verbalize understanding of fall prevention strategies to decrease risk of falling in home environment.    (Target date: 04/20/15)   Status On-going            PT Long Term Goals - 03/27/15 0828    PT LONG TERM GOAL #1   Title Pt will verbalize understanding of fall prevention strategies to decrease risk of falling in home environment.   (Target date: 05/18/15)   Status On-going   PT LONG TERM GOAL #2   Title Pt will improve Berg score by 6 points from baseline to indicate significant improvement in functional standing balance.    (Target date: 05/18/15)   Baseline Baseline Berg score = 38/56.   Status On-going   PT LONG TERM GOAL #3   Title Pt will improve gait velocity from 2.17 ft/sec to >/= 2.77 ft/sec to indicate improved efficiency of ambulation and indicate functional level of community ambulator.    (Target date: 05/18/15)   Status On-going  PT LONG TERM GOAL #4   Title Pt will ambulate 500' over unlevel, paved surfaces with mod I using LRAD to indicate increased safety with limited community mobility.     (Target date: 05/18/15)   Status On-going   PT LONG TERM GOAL #5   Title Pt will traverse standard ramps, door sills, and surface changes with mod I using LRAD to indicate safety negotiation community obstacles.   (Target date: 05/18/15)   Status On-going               Plan - 04/03/15 2218    Clinical Impression Statement Session focused on use of WB activities (dynamic tall kneeling) to increase proprioceptive input, promote activation/motor control in R gluteus max/medius to increase R stance stability during gait. Pt tolerated all interventions well.   Pt will benefit from skilled therapeutic intervention in order to improve on the following deficits Abnormal gait;Decreased cognition;Dizziness;Impaired sensation;Pain;Other (comment);Decreased balance;Decreased safety awareness;Impaired flexibility;Decreased strength;Decreased coordination;Impaired tone;Decreased knowledge of use of DME  Pain will be monitored but not directly addressed by PT.   Rehab Potential Good   Clinical Impairments Affecting Rehab Potential cognitive  impairments; pt refuses to wear AFO   PT Frequency 2x / week   PT Duration 8 weeks  anticipate DC after 6 weeks   PT Treatment/Interventions ADLs/Self Care Home Management;Vestibular;DME Instruction;Balance training;Therapeutic exercise;Therapeutic activities;Functional mobility training;Stair training;Gait training;Neuromuscular re-education;Patient/family education;Orthotic Fit/Training;Energy conservation   PT Next Visit Plan NMR with focus on WB activities (tall/half kneeling, quadruped). RLE strengthening with focus on quality of movement; functional core/hip strengthening.   PT Home Exercise Plan Recommend no more than 3-4 home exercises at a time due to cogntive impairments   Consulted and Agree with Plan of Care Patient        Problem List Patient Active Problem List   Diagnosis Date Noted  . Low back pain 09/20/2014  . Gait disorder 07/25/2013  . Headache(784.0) 05/31/2012  . MS (multiple sclerosis) (HCC) 04/22/2012  . Dilatation of colon 04/20/2012  . Abdominal distention 04/17/2012  . Loss of weight 04/17/2012  . Demyelinating disease of central nervous system (HCC) 04/16/2012  . History of CVA (cerebrovascular accident) 04/16/2012  . Weakness of right side of body 04/16/2012  . Multiple sclerosis (HCC) 04/16/2012  . HYPERCHOLESTEROLEMIA 11/11/2006  . PERIPHERAL VASCULAR DISEASE 11/11/2006  . INSOMNIA, HX OF 11/11/2006   Jorje Guild, PT, DPT [facility] [address] [place], [place], [address] Phone: [phone]   Fax:  [phone] 04/03/2015, 10:27 PM  Name: Michelle Hodges MRN: [id number] Date of Birth: [birth year]

## 2015-04-03 NOTE — Patient Instructions (Signed)
Michelle Hodges - make sure you stand with your back to a corner with a stable chair in front of you (just in case you lose your balance).  Balance: Eyes Closed - Bilateral (Varied Surfaces)    Stand on a pillow with feet shoulder width apart. Close eyes. Maintain balance __30__ seconds. Repeat __3_ times per set. Do this __3__ times per day.  Feet Apart (Compliant Surface) Head Motion - Eyes Open    With eyes open, feet apart on __1 pillow___, move head slowly: up and down 5 times; right to left 5 times. When you turn your head, find an object to "spot" in each direction. Repeat _3__ times per day.    Abductor Strength: Bridge Pose (Strap)    Lie on your back and loop your GREEN resistance band just above your knees. Make sure your right and left foot are equal length from your hips.  Push legs out against band while lifting hips from bed. Remember to lift hips high and move SLOWLY. Slowly lower back to starting position. Perform __15__ reps. Do this __3__ times per day.   Gaze Stabilization: Standing Feet Apart    Feet shoulder width apart, keeping eyes on target ("A") on wall __5-6__ feet away, tilt head down 15-30 and move head side to side for __30__ seconds. Repeat while moving head up and down for _30__ seconds. Do __2__ sessions per day with your glasses on, and 2 sessions per day with your glasses off.  Michelle Hodges, wear your glasses for this exercise. - Target ("A") must remain in focus, not blurry, and appear stationary while head is in motion. - Increase speed of head motion so long as target is in focus.

## 2015-04-04 ENCOUNTER — Telehealth: Payer: Self-pay | Admitting: *Deleted

## 2015-04-04 NOTE — Telephone Encounter (Signed)
It would ultimately be up to her therapist. It might be appropriate to go to 3x per week, but the utility of 4x per week is probably a stretch.

## 2015-04-04 NOTE — Telephone Encounter (Signed)
Pt's daughter left a message stating that pt has been going to therapy 2X a week. She says the pt is going to visit her other daughter at the end of the month. She is asking if it would be possible to double up the number of visits per week so she could complete her therapy before she leaves or should she maintain her present schedule for therapy, stop when she visits her daughter and then resume when she returns.  She says it will probably be a 2-3 week visit with her daughter.Marland KitchenMarland KitchenMarland KitchenMarland Kitchenplease advise

## 2015-04-05 ENCOUNTER — Other Ambulatory Visit: Payer: Self-pay | Admitting: Physical Medicine & Rehabilitation

## 2015-04-05 NOTE — Telephone Encounter (Signed)
Spoke with [name]. [name] states that she will ask the therapist to extend therapy to 3x per week.

## 2015-04-06 ENCOUNTER — Ambulatory Visit: Payer: Medicare Other | Attending: Physical Medicine & Rehabilitation | Admitting: Physical Therapy

## 2015-04-06 ENCOUNTER — Encounter: Payer: Self-pay | Admitting: Physical Therapy

## 2015-04-06 DIAGNOSIS — R29898 Other symptoms and signs involving the musculoskeletal system: Secondary | ICD-10-CM | POA: Insufficient documentation

## 2015-04-06 DIAGNOSIS — R201 Hypoesthesia of skin: Secondary | ICD-10-CM

## 2015-04-06 DIAGNOSIS — R2681 Unsteadiness on feet: Secondary | ICD-10-CM | POA: Diagnosis present

## 2015-04-06 DIAGNOSIS — R269 Unspecified abnormalities of gait and mobility: Secondary | ICD-10-CM | POA: Diagnosis present

## 2015-04-06 DIAGNOSIS — R278 Other lack of coordination: Secondary | ICD-10-CM | POA: Diagnosis present

## 2015-04-06 NOTE — Therapy (Signed)
[facility] [address] [place], [place], [address] Phone: [phone]   Fax:  [phone]  Physical Therapy Treatment  Patient Details  Name: Michelle Hodges MRN: [id number] Date of Birth: [birth year] Referring Provider: Faith Rogue, MD  Encounter Date: 04/06/2015      PT End of Session - 04/06/15 6962    Visit Number 5   Number of Visits 17   Date for PT Re-Evaluation 05/22/15   Authorization Type Medicare primary;  Medicaid secondary.   G Codes and PN required every 10 visits.   PT Start Time 0805   PT Stop Time 0845   PT Time Calculation (min) 40 min   Equipment Utilized During Treatment Gait belt   Activity Tolerance Patient tolerated treatment well   Behavior During Therapy WFL for tasks assessed/performed      Past Medical History  Diagnosis Date  . History of blood transfusion   . Hypertension   . MS (multiple sclerosis) [facility])     Past Surgical History  Procedure Laterality Date  . Tonsillectomy    . Colonoscopy N/A 04/20/2012    Procedure: COLONOSCOPY;  Surgeon: Beverley Fiedler, MD;  Location: [facility] ENDOSCOPY;  Service: Gastroenterology;  Laterality: N/A;  . Cystectomy      in HS    There were no vitals filed for this visit.  Visit Diagnosis:  Abnormality of gait  Coordination abnormal  Weakness of right lower extremity  Unsteadiness  Impaired sensation      Subjective Assessment - 04/06/15 0808    Subjective No new complaints. No falls or pain to report. Reports the corner balance HEP is going okay.   Pertinent History Likes to be called "[name]".   PMH: MS (diagnosed 02/2012), HTN, HLD, PVD   Patient Stated Goals "Wish I could walk like I used to...maybe do some jogging. And to get the right side of my body stronger."   Currently in Pain? No/denies     Treatment: Neuro Re-ed Pt performed HEP issued at previous session with min guard assist and cues on posture/ex  form.  Exercises Bridge with hip abduction with green band resistance, x 10 reps. Moderate cues needed to perform as HEP instructed.  Quadruped: No ball Alternating UE raises x 10 reps each side Alternating LE raises x 10 reps attempted combo UE/leg raises, pt needed maximal multimodal cues on sequencing/technique, and cues/assist for trunk stabilty, performed ~5-6 reps each side  With ball: improved form noted Alternating UE raises x 10 reps each side Alternating leg raises, 5 sec holds x 10 reps each side Combo UE/leg raises x 10 reps each side with 2-3 second holds with less cues needed.  tall kneeling with/without ball support: minimal cues needed on form/posture, min guard assist Partial sit backs, 5 sec holds x 10 reps with UE support Alternating UE raises x 10 reps, emphasis on tall posture Rolling ball out for modified plank with cues on core bracing/posture x 10 reps With dowel rod, alternating perturbations with emphasis on pt staying tall and resisting PTA pull, needed cues when changing pull directions for pt to prepare/maintain abdominal bracing/posture.         PT Short Term Goals - 03/27/15 9528    PT SHORT TERM GOAL #1   Title Pt will perform initial home exercise program with mod I using paper handout to indicate safe HEP compliance.   (Target date: 04/20/15)   Status On-going   PT SHORT TERM GOAL #2  Title Complete Berg Balance Scale to and improve score by 3 points from baseline to assess/address functional standing balance.    (Target date: 04/20/15)   Baseline 2/21: Baseline Berg score = 38/56.   Status On-going   PT SHORT TERM GOAL #3   Title Pt will increase gait velocity from 2.17 ft/sec to 2.47 to indicate increased efficiency of ambulation.    (Target date: 04/20/15)   Status On-going   PT SHORT TERM GOAL #4   Title Pt will ambulate 200' over level, indoor surfaces with mod I using LRAD to indicate increased safety with household mobility.     (Target  date: 04/20/15)   Status On-going   PT SHORT TERM GOAL #5   Title Pt will verbalize understanding of fall prevention strategies to decrease risk of falling in home environment.    (Target date: 04/20/15)   Status On-going           PT Long Term Goals - 03/27/15 0828    PT LONG TERM GOAL #1   Title Pt will verbalize understanding of fall prevention strategies to decrease risk of falling in home environment.   (Target date: 05/18/15)   Status On-going   PT LONG TERM GOAL #2   Title Pt will improve Berg score by 6 points from baseline to indicate significant improvement in functional standing balance.    (Target date: 05/18/15)   Baseline Baseline Berg score = 38/56.   Status On-going   PT LONG TERM GOAL #3   Title Pt will improve gait velocity from 2.17 ft/sec to >/= 2.77 ft/sec to indicate improved efficiency of ambulation and indicate functional level of community ambulator.    (Target date: 05/18/15)   Status On-going   PT LONG TERM GOAL #4   Title Pt will ambulate 500' over unlevel, paved surfaces with mod I using LRAD to indicate increased safety with limited community mobility.     (Target date: 05/18/15)   Status On-going   PT LONG TERM GOAL #5   Title Pt will traverse standard ramps, door sills, and surface changes with mod I using LRAD to indicate safety negotiation community obstacles.   (Target date: 05/18/15)   Status On-going            Plan - 04/06/15 0808    Clinical Impression Statement Continued to work on balance and strengthening toward goals. No issues reported in session today.   Pt will benefit from skilled therapeutic intervention in order to improve on the following deficits Abnormal gait;Decreased cognition;Dizziness;Impaired sensation;Pain;Other (comment);Decreased balance;Decreased safety awareness;Impaired flexibility;Decreased strength;Decreased coordination;Impaired tone;Decreased knowledge of use of DME  Pain will be monitored but not directly addressed by  PT.   Rehab Potential Good   Clinical Impairments Affecting Rehab Potential cognitive impairments; pt refuses to wear AFO   PT Frequency 2x / week   PT Duration 8 weeks  anticipate DC after 6 weeks   PT Treatment/Interventions ADLs/Self Care Home Management;Vestibular;DME Instruction;Balance training;Therapeutic exercise;Therapeutic activities;Functional mobility training;Stair training;Gait training;Neuromuscular re-education;Patient/family education;Orthotic Fit/Training;Energy conservation   PT Next Visit Plan NMR with focus on WB activities (tall/half kneeling, quadruped). RLE strengthening with focus on quality of movement; functional core/hip strengthening.   PT Home Exercise Plan Recommend no more than 3-4 home exercises at a time due to cogntive impairments   Consulted and Agree with Plan of Care Patient        Problem List Patient Active Problem List   Diagnosis Date Noted  . Low back pain 09/20/2014  .  Gait disorder 07/25/2013  . Headache(784.0) 05/31/2012  . MS (multiple sclerosis) (HCC) 04/22/2012  . Dilatation of colon 04/20/2012  . Abdominal distention 04/17/2012  . Loss of weight 04/17/2012  . Demyelinating disease of central nervous system (HCC) 04/16/2012  . History of CVA (cerebrovascular accident) 04/16/2012  . Weakness of right side of body 04/16/2012  . Multiple sclerosis (HCC) 04/16/2012  . HYPERCHOLESTEROLEMIA 11/11/2006  . PERIPHERAL VASCULAR DISEASE 11/11/2006  . INSOMNIA, HX OF 11/11/2006    Sallyanne Kuster, PTA, [facility] Outpatient Neuro [facility] [address] [place], [place] [address] [phone] 04/06/2015, 12:17 PM   Name: LETZY GULLICKSON MRN: [id number] Date of Birth: September 25, 1945

## 2015-04-10 ENCOUNTER — Ambulatory Visit: Payer: Medicare Other | Admitting: Physical Therapy

## 2015-04-10 DIAGNOSIS — M6289 Other specified disorders of muscle: Secondary | ICD-10-CM | POA: Insufficient documentation

## 2015-04-10 DIAGNOSIS — R269 Unspecified abnormalities of gait and mobility: Secondary | ICD-10-CM

## 2015-04-10 DIAGNOSIS — G35 Multiple sclerosis: Secondary | ICD-10-CM | POA: Insufficient documentation

## 2015-04-10 DIAGNOSIS — R29898 Other symptoms and signs involving the musculoskeletal system: Secondary | ICD-10-CM | POA: Diagnosis present

## 2015-04-10 DIAGNOSIS — R278 Other lack of coordination: Secondary | ICD-10-CM | POA: Insufficient documentation

## 2015-04-10 DIAGNOSIS — R2681 Unsteadiness on feet: Secondary | ICD-10-CM | POA: Diagnosis present

## 2015-04-10 DIAGNOSIS — R201 Hypoesthesia of skin: Secondary | ICD-10-CM | POA: Diagnosis present

## 2015-04-10 NOTE — Therapy (Signed)
[facility] [address] [place], [place], [address] Phone: [phone]   Fax:  [phone]  Physical Therapy Treatment  Patient Details  Name: Michelle Hodges MRN: [id number] Date of Birth: Mar 13, 1945 Referring Provider: Alger Simons, MD  Encounter Date: 04/10/2015      PT End of Session - 04/10/15 0926    Visit Number 6   Number of Visits 17   Date for PT Re-Evaluation 05/22/15   Authorization Type Medicare primary;  Medicaid secondary.   G Codes and PN required every 10 visits.   PT Start Time 0801   PT Stop Time 0846   PT Time Calculation (min) 45 min   Equipment Utilized During Treatment Gait belt   Activity Tolerance Patient tolerated treatment well   Behavior During Therapy WFL for tasks assessed/performed      Past Medical History  Diagnosis Date  . History of blood transfusion   . Hypertension   . MS (multiple sclerosis) [facility])     Past Surgical History  Procedure Laterality Date  . Tonsillectomy    . Colonoscopy N/A 04/20/2012    Procedure: COLONOSCOPY;  Surgeon: Jerene Bears, MD;  Location: [place];  Service: Gastroenterology;  Laterality: N/A;  . Cystectomy      in HS    There were no vitals filed for this visit.  Visit Diagnosis:  Abnormality of gait  Coordination abnormal  Weakness of right lower extremity  Unsteadiness      Subjective Assessment - 04/10/15 0802    Subjective Pt reports no falls and no significant changes. Reports HEP is "good; very good." Reports daughter is unhappy that this therapist will not modify frequency to 3 to 4x/week.   Pertinent History Likes to be called "Manuela Schwartz".   PMH: MS (diagnosed 02/2012), HTN, HLD, PVD   Patient Stated Goals "Wish I could walk like I used to...maybe do some jogging. And to get the right side of my body stronger."   Currently in Pain? No/denies                         [facility] Adult PT Treatment/Exercise -  04/10/15 0001    Ambulation/Gait   Ambulation/Gait Yes   Ambulation/Gait Assistance 5: Supervision   Ambulation/Gait Assistance Details min guard due to LE scissoring when pt performed head turns   Ambulation Distance (Feet) 630 Feet   Assistive device Straight cane   Gait Pattern Step-through pattern;Decreased stride length;Decreased hip/knee flexion - right;Decreased dorsiflexion - right;Left circumduction;Narrow base of support;Scissoring;Decreased hip/knee flexion - left;Decreased weight shift to left;Decreased dorsiflexion - left  LE scissoring only noted with head turns   Ambulation Surface Level;Indoor   Gait Comments Noted postural alignment (B hip extension throughout gait cycle) much improved. Cueing focused on attention to RLE, consistent R hip/knee flexion during RLE advancement, R heel strike, and lateral weight shift to L.   Neuro Re-ed    Neuro Re-ed Details  Supine, R hip flexor self-stretch 3 x60s-sec holds to address extensor tone in RLE to address RLE circumduction during gait. Standing, B gastrocnemius/soleus self-stretch 2 x30-sec holds each muscle group, bilaterally, to address limited B ankle DF during advancement of respective LE during gait. Standing with single UE support, performed B ankle DF x20 reps with cueing for visual attention to R ankle to address selective control of R ankle DF with concurrent R knee ext. Without UE support, performed transition from tall kneeling <> R/L half kneeling x10  reps per side with noted improvement in lateral weight shift to L during transition from tall kneeling <> R half kneeling as compared with previous session; cueing focused on increased attention to RLE placement (avoiding excessive RLE adduction). Performed B half kneeling without UE support x30-sec holds per side with min guard to min A for stability/balance with focus on core/hip stabilization and LE dissociation.   Exercises   Exercises Other Exercises   Other Exercises  Pt  performed all home exercises provided during previous session with use of paper handout, only subtle cueing required for technique. Progressed all exercises to pt tolerance. See Pt Instuctions for details on all exercises, reps, sets, frequency, and duration.          Vestibular Treatment/Exercise - 04/10/15 0001    Vestibular Treatment/Exercise   Vestibular Treatment Provided Gaze   Gaze Exercises X1 Viewing Horizontal;X1 Viewing Vertical   X1 Viewing Horizontal   Foot Position standing with SPC   Reps 1   Comments x30 sec with subtle cueing for technique.   X1 Viewing Vertical   Foot Position standing   Reps 1   Comments x30 sec            Balance Exercises - 04/10/15 0925    Balance Exercises: Standing   Standing Eyes Closed Wide (BOA);Foam/compliant surface;Other reps (comment)  1 pillow; horizontal, vertical head turns x10 each           PT Education - 04/10/15 0820    Education provided Yes   Education Details HEP progressed; see Pt Instructions.   Spoke with both pt and daughter post-session about rationale for frequency 2x/week, with emphasis on MS-related fatigue, excellent pt compliance with HEP.   Person(s) Educated Patient;Child(ren)  daughter, Ebony Hail   Methods Explanation;Demonstration;Verbal cues;Handout   Comprehension Verbalized understanding;Returned demonstration  .          PT Short Term Goals - 04/10/15 0820    PT SHORT TERM GOAL #1   Title Pt will perform initial home exercise program with mod I using paper handout to indicate safe HEP compliance.   (Target date: 04/20/15)   Baseline Met 3/7.   Status Achieved   PT SHORT TERM GOAL #2   Title Complete Berg Balance Scale to and improve score by 3 points from baseline to assess/address functional standing balance.    (Target date: 04/20/15)   Baseline 2/21: Baseline Berg score = 38/56.   Status On-going   PT SHORT TERM GOAL #3   Title Pt will increase gait velocity from 2.17 ft/sec to 2.47 to  indicate increased efficiency of ambulation.    (Target date: 04/20/15)   Status On-going   PT SHORT TERM GOAL #4   Title Pt will ambulate 200' over level, indoor surfaces with mod I using LRAD to indicate increased safety with household mobility.     (Target date: 04/20/15)   Status On-going   PT SHORT TERM GOAL #5   Title Pt will verbalize understanding of fall prevention strategies to decrease risk of falling in home environment.    (Target date: 04/20/15)   Status On-going           PT Long Term Goals - 03/27/15 0828    PT LONG TERM GOAL #1   Title Pt will verbalize understanding of fall prevention strategies to decrease risk of falling in home environment.   (Target date: 05/18/15)   Status On-going   PT LONG TERM GOAL #2   Title Pt will improve Merrilee Jansky  score by 6 points from baseline to indicate significant improvement in functional standing balance.    (Target date: 05/18/15)   Baseline Baseline Berg score = 38/56.   Status On-going   PT LONG TERM GOAL #3   Title Pt will improve gait velocity from 2.17 ft/sec to >/= 2.77 ft/sec to indicate improved efficiency of ambulation and indicate functional level of community ambulator.    (Target date: 05/18/15)   Status On-going   PT LONG TERM GOAL #4   Title Pt will ambulate 500' over unlevel, paved surfaces with mod I using LRAD to indicate increased safety with limited community mobility.     (Target date: 05/18/15)   Status On-going   PT LONG TERM GOAL #5   Title Pt will traverse standard ramps, door sills, and surface changes with mod I using LRAD to indicate safety negotiation community obstacles.   (Target date: 05/18/15)   Status On-going               Plan - 04/10/15 0927    Clinical Impression Statement Session focused on addressing RLE extensor synergy,  increasing RLE selective control to increase gait stability and decrease fall risk. STG 1 met for HEP performance; current HEP progressed. Spoke with pt and daughter about  rationale for PT frequency of 2x/week. Pt/daughter verbalized understanding and were in full agreement.    Pt will benefit from skilled therapeutic intervention in order to improve on the following deficits Abnormal gait;Decreased cognition;Dizziness;Impaired sensation;Pain;Other (comment);Decreased balance;Decreased safety awareness;Impaired flexibility;Decreased strength;Decreased coordination;Impaired tone;Decreased knowledge of use of DME  Pain will be monitored but not directly addressed by PT.   Rehab Potential Good   Clinical Impairments Affecting Rehab Potential cognitive impairments; pt refuses to wear AFO   PT Frequency 2x / week   PT Duration 8 weeks  anticipate DC after 6 weeks   PT Treatment/Interventions ADLs/Self Care Home Management;Vestibular;DME Instruction;Balance training;Therapeutic exercise;Therapeutic activities;Functional mobility training;Stair training;Gait training;Neuromuscular re-education;Patient/family education;Orthotic Fit/Training;Energy conservation   PT Next Visit Plan RLE strengthening with focus on quality of movement; functional core/hip strengthening. Address extensor synergy/tone in RLE.   Consulted and Agree with Plan of Care Patient;Family member/caregiver   Family Member Consulted daughter,  Ebony Hail.        Problem List Patient Active Problem List   Diagnosis Date Noted  . Low back pain 09/20/2014  . Gait disorder 07/25/2013  . Headache(784.0) 05/31/2012  . MS (multiple sclerosis) ([place]) 04/22/2012  . Dilatation of colon 04/20/2012  . Abdominal distention 04/17/2012  . Loss of weight 04/17/2012  . Demyelinating disease of central nervous system ([place]) 04/16/2012  . History of CVA (cerebrovascular accident) 04/16/2012  . Weakness of right side of body 04/16/2012  . Multiple sclerosis ([place]) 04/16/2012  . HYPERCHOLESTEROLEMIA 11/11/2006  . PERIPHERAL VASCULAR DISEASE 11/11/2006  . INSOMNIA, HX OF 11/11/2006    Billie Ruddy, PT, DPT [facility] [address] [place], [place], [address] Phone: [phone]   Fax:  [phone] 04/10/2015, 9:30 AM  Name: PATRIC VANPELT MRN: [id number] Date of Birth: [birth year]

## 2015-04-10 NOTE — Patient Instructions (Addendum)
[name] - make sure you stand with your back to a corner with a stable chair in front of you (just in case you lose your balance).  Feet Apart (Compliant Surface) Head Motion - Eyes Closed    Stand on 1 pillow with feet shoulder width apart. Close eyes and move head slowly: up and down 10 times; right to left 10 times.  Do __3__ sessions per day.  Copyright  VHI. All rights reserved.      Abductor Strength: Bridge Pose (Strap)    Lie on your back and loop your BLUE resistance band just above your knees. Make sure your right and left foot are equal length from your hips.  Push legs out against band while lifting hips from bed. Remember to lift hips high and move SLOWLY. Slowly lower back to starting position. Perform __20__ reps. Do this __3__ times per day.   Gaze Stabilization: Standing Feet Apart    Feet shoulder width apart, keeping eyes on target ("A") on wall __5-6__ feet away, tilt head down 15-30 and move head side to side for __30__ seconds. Repeat while moving head up and down for _30__ seconds. Do __2__ sessions per day with your glasses on, and 2 sessions per day with your glasses off.  [name], wear your glasses for this exercise. - Target ("A") must remain in focus, not blurry, and appear stationary while head is in motion. - Increase speed of head motion so long as target is in focus.     HIP: Flexors - Supine    Lie on edge of surface. Place RIGHT leg off the surface, allow knee to bend. BringoLEFT knee toward chest. Hold for 1 minute, 2-3 times per day. Try to do this stretch first thing in the morning before you start walking.  Copyright  VHI. All rights reserved.

## 2015-04-13 ENCOUNTER — Ambulatory Visit: Payer: Medicare Other | Admitting: Physical Therapy

## 2015-04-17 ENCOUNTER — Ambulatory Visit: Payer: Medicare Other | Admitting: Physical Therapy

## 2015-04-20 ENCOUNTER — Ambulatory Visit: Payer: Medicare Other | Admitting: Physical Therapy

## 2015-04-24 ENCOUNTER — Ambulatory Visit: Payer: Medicare Other | Admitting: Physical Therapy

## 2015-04-27 ENCOUNTER — Ambulatory Visit: Payer: Medicare Other | Admitting: Physical Therapy

## 2015-04-30 ENCOUNTER — Ambulatory Visit: Payer: Medicare Other | Admitting: Physical Therapy

## 2015-05-04 ENCOUNTER — Ambulatory Visit: Payer: Medicare Other | Admitting: Physical Therapy

## 2015-05-08 ENCOUNTER — Ambulatory Visit: Payer: Medicare Other | Admitting: Physical Therapy

## 2015-05-09 ENCOUNTER — Telehealth: Payer: Self-pay | Admitting: *Deleted

## 2015-05-09 MED ORDER — DICLOFENAC SODIUM 1 % TD GEL: 2.0000 g | Freq: Three times a day (TID) | TRANSDERMAL | Status: DC

## 2015-05-09 MED ORDER — TRAMADOL HCL 50 MG PO TABS: 50.0000 mg | ORAL_TABLET | Freq: Four times a day (QID) | ORAL | Status: DC | PRN

## 2015-05-09 NOTE — Telephone Encounter (Signed)
Mrs Burkemper daughter called and she will be staying temporarily in [place] and needs a refill on her tramadol and voltaren gel sent there. I have sent one fill of each to the [facility] there. I notified her daughter [name] this had been done.

## 2015-05-11 ENCOUNTER — Ambulatory Visit: Payer: Medicare Other | Attending: Physical Medicine & Rehabilitation

## 2015-05-12 ENCOUNTER — Encounter: Payer: Self-pay | Admitting: Physical Therapy

## 2015-05-12 NOTE — Therapy (Signed)
[facility] [address] [place], [place], [address] Phone: [phone]   Fax:  [phone]  Patient Details  Name: MERLEEN PICAZO MRN: [id number] Date of Birth: Dec 15, 1945 Referring Provider: Alger Simons, MD  Encounter Date: 05/12/2015   PHYSICAL THERAPY DISCHARGE SUMMARY  Visits from Start of Care: 6  Current functional level related to goals / functional outcomes: Unknown, as patient did not return to PT after initial 6 sessions.      PT Long Term Goals - 03/27/15 0630    PT LONG TERM GOAL #1   Title Pt will verbalize understanding of fall prevention strategies to decrease risk of falling in home environment.   (Target date: 05/18/15)   Status On-going   PT LONG TERM GOAL #2   Title Pt will improve Berg score by 6 points from baseline to indicate significant improvement in functional standing balance.    (Target date: 05/18/15)   Baseline Baseline Berg score = 38/56.   Status On-going   PT LONG TERM GOAL #3   Title Pt will improve gait velocity from 2.17 ft/sec to >/= 2.77 ft/sec to indicate improved efficiency of ambulation and indicate functional level of community ambulator.    (Target date: 05/18/15)   Status On-going   PT LONG TERM GOAL #4   Title Pt will ambulate 500' over unlevel, paved surfaces with mod I using LRAD to indicate increased safety with limited community mobility.     (Target date: 05/18/15)   Status On-going   PT LONG TERM GOAL #5   Title Pt will traverse standard ramps, door sills, and surface changes with mod I using LRAD to indicate safety negotiation community obstacles.   (Target date: 05/18/15)   Status On-going        Remaining deficits: Unknown, as patient did not return to PT after initial 6 sessions.    Education / Equipment: HEP, fall prevention strategies.  Plan: Patient agrees to discharge.  Patient goals were not met. Patient is being discharged due to the patient's  request.  ?????       Patient's daughter contacted outpatient neurorehab to cancel all remaining appointments, stating that patient was no longer interested in coming to physical therapy.  Billie Ruddy, PT, DPT [facility] [address] [place], [place], [address] Phone: [phone]   Fax:  [phone] 05/12/2015, 3:04 PM

## 2015-05-14 ENCOUNTER — Ambulatory Visit: Payer: Medicare Other | Admitting: Physical Therapy

## 2015-05-16 ENCOUNTER — Ambulatory Visit: Payer: Medicare Other | Admitting: Physical Therapy

## 2015-05-17 ENCOUNTER — Ambulatory Visit: Payer: Medicare Other | Admitting: Physical Therapy

## 2015-05-21 ENCOUNTER — Ambulatory Visit: Payer: Medicare Other | Admitting: Physical Therapy

## 2015-05-25 ENCOUNTER — Ambulatory Visit: Payer: Medicare Other | Admitting: Physical Therapy

## 2015-05-29 ENCOUNTER — Ambulatory Visit: Payer: Medicare Other | Admitting: Physical Therapy

## 2015-06-01 ENCOUNTER — Ambulatory Visit: Payer: Medicare Other | Admitting: Physical Therapy

## 2015-06-18 ENCOUNTER — Encounter: Payer: Medicare Other | Attending: Physical Medicine & Rehabilitation | Admitting: Physical Medicine & Rehabilitation

## 2015-06-18 DIAGNOSIS — R4189 Other symptoms and signs involving cognitive functions and awareness: Secondary | ICD-10-CM | POA: Insufficient documentation

## 2015-06-18 DIAGNOSIS — R410 Disorientation, unspecified: Secondary | ICD-10-CM | POA: Insufficient documentation

## 2015-06-18 DIAGNOSIS — R42 Dizziness and giddiness: Secondary | ICD-10-CM | POA: Insufficient documentation

## 2015-06-18 DIAGNOSIS — Z09 Encounter for follow-up examination after completed treatment for conditions other than malignant neoplasm: Secondary | ICD-10-CM | POA: Insufficient documentation

## 2015-06-18 DIAGNOSIS — R202 Paresthesia of skin: Secondary | ICD-10-CM | POA: Insufficient documentation

## 2015-06-18 DIAGNOSIS — R51 Headache: Secondary | ICD-10-CM | POA: Insufficient documentation

## 2015-06-18 DIAGNOSIS — Z87891 Personal history of nicotine dependence: Secondary | ICD-10-CM | POA: Insufficient documentation

## 2015-06-18 DIAGNOSIS — G35 Multiple sclerosis: Secondary | ICD-10-CM | POA: Insufficient documentation

## 2015-06-18 DIAGNOSIS — R200 Anesthesia of skin: Secondary | ICD-10-CM | POA: Insufficient documentation

## 2015-07-05 ENCOUNTER — Other Ambulatory Visit: Payer: Self-pay | Admitting: *Deleted

## 2015-07-05 MED ORDER — DICLOFENAC SODIUM 1 % TD GEL
2.0000 g | Freq: Three times a day (TID) | TRANSDERMAL | Status: DC
Start: 2015-07-05 — End: 2020-07-08

## 2016-02-13 ENCOUNTER — Other Ambulatory Visit: Payer: Self-pay | Admitting: Cardiology

## 2016-02-13 DIAGNOSIS — Z1231 Encounter for screening mammogram for malignant neoplasm of breast: Secondary | ICD-10-CM

## 2016-02-28 ENCOUNTER — Other Ambulatory Visit: Payer: Self-pay | Admitting: Family Medicine

## 2016-02-28 DIAGNOSIS — Z79899 Other long term (current) drug therapy: Secondary | ICD-10-CM

## 2016-03-24 ENCOUNTER — Ambulatory Visit
Admission: RE | Admit: 2016-03-24 | Discharge: 2016-03-24 | Disposition: A | Discharge status: Home / Self Care | Payer: Medicare Other | Source: Ambulatory Visit | Attending: Cardiology | Admitting: Cardiology

## 2016-03-24 ENCOUNTER — Other Ambulatory Visit: Payer: Medicare Other

## 2016-03-24 ENCOUNTER — Ambulatory Visit
Admission: RE | Admit: 2016-03-24 | Discharge: 2016-03-24 | Disposition: A | Discharge status: Home / Self Care | Payer: Medicare Other | Source: Ambulatory Visit | Attending: Family Medicine | Admitting: Family Medicine

## 2016-03-24 ENCOUNTER — Ambulatory Visit: Payer: Medicare Other

## 2016-03-24 DIAGNOSIS — Z79899 Other long term (current) drug therapy: Secondary | ICD-10-CM

## 2016-03-24 DIAGNOSIS — Z1231 Encounter for screening mammogram for malignant neoplasm of breast: Secondary | ICD-10-CM

## 2017-01-16 ENCOUNTER — Other Ambulatory Visit: Payer: Self-pay | Admitting: Cardiology

## 2017-01-16 DIAGNOSIS — Z1231 Encounter for screening mammogram for malignant neoplasm of breast: Secondary | ICD-10-CM

## 2017-03-12 ENCOUNTER — Ambulatory Visit: Payer: Medicare Other

## 2017-03-25 ENCOUNTER — Ambulatory Visit: Payer: Medicare Other

## 2017-04-06 ENCOUNTER — Ambulatory Visit
Admission: RE | Admit: 2017-04-06 | Discharge: 2017-04-06 | Disposition: A | Discharge status: Home / Self Care | Payer: Medicare Other | Source: Ambulatory Visit | Attending: Cardiology | Admitting: Cardiology

## 2017-04-06 ENCOUNTER — Ambulatory Visit: Payer: Medicare Other

## 2017-04-06 DIAGNOSIS — Z1231 Encounter for screening mammogram for malignant neoplasm of breast: Secondary | ICD-10-CM

## 2017-11-12 ENCOUNTER — Other Ambulatory Visit: Payer: Self-pay | Admitting: Cardiology

## 2017-12-01 ENCOUNTER — Other Ambulatory Visit: Payer: Self-pay | Admitting: Family Medicine

## 2017-12-01 DIAGNOSIS — Z1231 Encounter for screening mammogram for malignant neoplasm of breast: Secondary | ICD-10-CM

## 2020-07-08 ENCOUNTER — Other Ambulatory Visit: Payer: Self-pay

## 2020-07-08 ENCOUNTER — Encounter (HOSPITAL_COMMUNITY): Payer: Self-pay | Admitting: Emergency Medicine

## 2020-07-08 ENCOUNTER — Ambulatory Visit (HOSPITAL_COMMUNITY)
Admission: EM | Admit: 2020-07-08 | Discharge: 2020-07-08 | Disposition: A | Discharge status: Home / Self Care | Payer: Medicare Other

## 2020-07-08 DIAGNOSIS — S90851A Superficial foreign body, right foot, initial encounter: Secondary | ICD-10-CM

## 2020-07-08 DIAGNOSIS — M25471 Effusion, right ankle: Secondary | ICD-10-CM | POA: Diagnosis not present

## 2020-07-08 MED ORDER — DICLOFENAC SODIUM 1 % EX GEL: 2.0000 g | Freq: Four times a day (QID) | CUTANEOUS | 1 refills | Status: DC

## 2020-07-08 NOTE — ED Triage Notes (Signed)
Pt presents with right foot pain xs 2 days. States thinks there may be glass in foot.

## 2020-07-08 NOTE — Discharge Instructions (Addendum)
Can use gel over area 4 times a day as needed  Can use ibuprofen 400-600 mg ibuprofen every 6 hours as needed for pain   Can elevate ankle while sitting or lying to help with pain  Can wear compression stocking 2-3 hours then remove for 2-3 hours, repeat to help with swelling

## 2020-07-09 NOTE — ED Provider Notes (Signed)
[facility]    CSN: [id number] Arrival date & time: 07/08/20  1715      History   Chief Complaint Chief Complaint  Patient presents with  . Foot Pain    Right    HPI Michelle Hodges is a 75 y.o. female.   Patient presents with right foot pain for two days after possibly stepping in glass. Dropped cup onto ground, does not recall stepping in glass, does not recall bleeding but feels like something is in her foot. Denies numbness, tingling. ROM intact.  Concerned with right ankle swelling that began within the last 2-3 days. ROM intact. Denies pain, tenderness, numbness, tingling. Denies injury, trauma or prior injury. Per daughter, patient has been walking and doing more activities over the last few days than normal. Using walker at baseline.   Past Medical History:  Diagnosis Date  . History of blood transfusion   . Hypertension   . MS (multiple sclerosis) [facility])     Patient Active Problem List   Diagnosis Date Noted  . Low back pain 09/20/2014  . Gait disorder 07/25/2013  . Headache(784.0) 05/31/2012  . MS (multiple sclerosis) (HCC) 04/22/2012  . Dilatation of colon 04/20/2012  . Abdominal distention 04/17/2012  . Loss of weight 04/17/2012  . Demyelinating disease of central nervous system (HCC) 04/16/2012  . History of CVA (cerebrovascular accident) 04/16/2012  . Weakness of right side of body 04/16/2012  . Multiple sclerosis (HCC) 04/16/2012  . HYPERCHOLESTEROLEMIA 11/11/2006  . PERIPHERAL VASCULAR DISEASE 11/11/2006  . INSOMNIA, HX OF 11/11/2006    Past Surgical History:  Procedure Laterality Date  . COLONOSCOPY N/A 04/20/2012   Procedure: COLONOSCOPY;  Surgeon: Beverley Fiedler, [place];  Location: [facility] ENDOSCOPY;  Service: Gastroenterology;  Laterality: N/A;  . CYSTECTOMY     in HS  . TONSILLECTOMY      OB History   No obstetric history on file.      Home Medications    Prior to Admission medications   Medication Sig Start Date End Date Taking?  Authorizing Provider  diclofenac Sodium (VOLTAREN) 1 % GEL Apply 2 g topically 4 (four) times daily. 07/08/20  Yes Blessed Cotham R, NP  amLODipine (NORVASC) 5 MG tablet Take 1 tablet by mouth daily. 06/29/20   [provider]  aspirin EC 81 MG tablet Take 81 mg by mouth daily as needed.    [provider]  Brewers Yeast 487.5 MG TABS Take by mouth 2 times daily. 1 in am , 2 at evening    [provider]  cholecalciferol (D-VI-SOL) 400 UNIT/ML LIQD Take 400 Units by mouth daily.    [provider]  dalfampridine (AMPYRA) 10 MG TB12 Take one pill by mouth twice daily 06/28/14   [provider]  fish oil-omega-3 fatty acids 1000 MG capsule Take 2 g by mouth daily.    [provider]  MAGNESIUM CITRATE PO Take by mouth.    [provider]  traMADol (ULTRAM) 50 MG tablet Take 1 tablet (50 mg total) by mouth every 6 (six) hours as needed. for pain 05/09/15   Ranelle Oyster, MD    Family History Family History  Problem Relation Age of Onset  . Breast cancer Mother 13  . Stroke Father   . Stroke Sister   . Breast cancer Sister   . Cancer - Other Maternal Grandmother        stomach  . Multiple sclerosis Cousin     Social History Social  History   Tobacco Use  . Smoking status: Former Smoker    Quit date: 02/04/1984    Years since quitting: 36.4  . Smokeless tobacco: Never Used  Substance Use Topics  . Alcohol use: Yes    Comment: Wine Occassionally  . Drug use: No    Types: Marijuana    Comment: "I'm around it", quit using 04/05/2012     Allergies   Patient has no active allergies.   Review of Systems Review of Systems  Defer to HPI    Physical Exam Triage Vital Signs ED Triage Vitals  Enc Vitals Group     BP 07/08/20 1741 139/88     Pulse Rate 07/08/20 1741 73     Resp 07/08/20 1741 17     Temp 07/08/20 1741 98.3 F (36.8 C)     Temp Source 07/08/20 1741 Oral     SpO2 07/08/20 1741 98 %     Weight --       Height --      Head Circumference --      Peak Flow --      Pain Score 07/08/20 1739 3     Pain Loc --      Pain Edu? --      Excl. in GC? --    No data found.  Updated Vital Signs BP 139/88 (BP Location: Left Arm)   Pulse 73   Temp 98.3 F (36.8 C) (Oral)   Resp 17   SpO2 98%   Visual Acuity Right Eye Distance:   Left Eye Distance:   Bilateral Distance:    Right Eye Near:   Left Eye Near:    Bilateral Near:     Physical Exam Constitutional:      Appearance: Normal appearance. She is normal weight.  HENT:     Head: Normocephalic.  Eyes:     Extraocular Movements: Extraocular movements intact.  Pulmonary:     Effort: Pulmonary effort is normal.  Musculoskeletal:     Right ankle: Swelling present. No ecchymosis. No tenderness. Normal range of motion.     Left ankle: Normal.     Comments: Minute opening in center of heel, no erythema, tenderness, swelling noted. 2+ dorsalis pedis pulse, sensation intact   Skin:    General: Skin is warm and dry.  Neurological:     General: No focal deficit present.     Mental Status: She is alert and oriented to person, place, and time. Mental status is at baseline.  Psychiatric:        Mood and Affect: Mood normal.        Behavior: Behavior normal.        Thought Content: Thought content normal.        Judgment: Judgment normal.      UC Treatments / Results  Labs (all labs ordered are listed, but only abnormal results are displayed) Labs Reviewed - No data to display  EKG   Radiology No results found.  Procedures Procedures (including critical care time)  Medications Ordered in UC Medications - No data to display  Initial Impression / Assessment and Plan / UC Course  I have reviewed the triage vital signs and the nursing notes.  Pertinent labs & imaging results that were available during my care of the patient were reviewed by me and considered in my medical decision making (see chart for details).  Right ankle  swelling Foreign body in right foot  1. Diclofenac gel 1% four times  a day prn 2. otc ibuprofen prn  3. Minute piece of glass removed from right foot heel with 11 blade, numbed with painease mist spray, area cleansed with betadine.  4. Advised elevation, compression socks and heat to ease swelling as needed  Final Clinical Impressions(s) / UC Diagnoses   Final diagnoses:  Right ankle swelling     Discharge Instructions     Can use gel over area 4 times a day as needed  Can use ibuprofen 400-600 mg ibuprofen every 6 hours as needed for pain   Can elevate ankle while sitting or lying to help with pain  Can wear compression stocking 2-3 hours then remove for 2-3 hours, repeat to help with swelling   ED Prescriptions    Medication Sig Dispense Auth. Provider   diclofenac Sodium (VOLTAREN) 1 % GEL Apply 2 g topically 4 (four) times daily. 100 g Valinda Hoar, NP     PDMP not reviewed this encounter.   Valinda Hoar, [place] 07/09/20 [phone]

## 2021-07-25 ENCOUNTER — Other Ambulatory Visit: Payer: Self-pay | Admitting: Obstetrics & Gynecology

## 2021-07-25 DIAGNOSIS — Z1231 Encounter for screening mammogram for malignant neoplasm of breast: Secondary | ICD-10-CM

## 2021-08-12 ENCOUNTER — Ambulatory Visit: Payer: Medicare Other

## 2021-08-13 ENCOUNTER — Ambulatory Visit: Payer: Medicare Other

## 2021-08-14 ENCOUNTER — Ambulatory Visit
Admission: RE | Admit: 2021-08-14 | Discharge: 2021-08-14 | Disposition: A | Discharge status: Home / Self Care | Payer: Medicare Other | Source: Ambulatory Visit | Attending: Obstetrics & Gynecology | Admitting: Obstetrics & Gynecology

## 2021-08-14 DIAGNOSIS — Z1231 Encounter for screening mammogram for malignant neoplasm of breast: Secondary | ICD-10-CM

## 2021-11-04 ENCOUNTER — Ambulatory Visit (INDEPENDENT_AMBULATORY_CARE_PROVIDER_SITE_OTHER): Payer: Medicare Other | Admitting: Diagnostic Neuroimaging

## 2021-11-04 ENCOUNTER — Encounter: Payer: Self-pay | Admitting: *Deleted

## 2021-11-04 VITALS — BP 132/81 | HR 68 | Ht 69.0 in | Wt 124.0 lb

## 2021-11-04 DIAGNOSIS — G35 Multiple sclerosis: Secondary | ICD-10-CM | POA: Diagnosis not present

## 2021-11-04 NOTE — Progress Notes (Signed)
[facility]  PATIENT: Michelle Hodges DOB: 03-25-1945  REFERRING CLINICIAN: [name], MD  HISTORY FROM: patient and daughter Michelle Hodges) REASON FOR VISIT: new consult   HISTORICAL  CHIEF COMPLAINT:  Chief Complaint  Patient presents with   Multiple Sclerosis    Rm 6, est patient last seen 2015, dgtr- Michelle Hodges "has not seen [facility]- Dr Michelle Hodges, went to [place] to live for a while with dgtr but no neuro care there; need to re-establish neuro care here"     HISTORY OF PRESENT ILLNESS:   UPDATE (11/04/21, VRP): Since last visit, balance has progressively worsened. 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 Then lived in [place] for few years; now back to [place] (living with daughter Michelle Hodges). Having some issues with insomnia and balance. Not that interested in medications.  UPDATE 05/03/13: Since last visit patient unfortunately did not followup. Patient did have second opinion at [facility] neurology (Dr. Renne Hodges). Patient has had progressive weakness and difficulty. She still reluctant to try disease modifying therapy. Laboratory testing reviewed with patient. Patient's daughter is here for the visit as well.  PRIOR HPI (05/14/12): 76 year old right-handed female here for evaluation of possible multiple sclerosis.  Patient was admitted to the hospital in March 2014 for right-sided weakness and possible stroke. MRI of the brain showed no acute stroke. MRI cervical spine showed multiple spinal cord lesions. Patient had lumbar puncture and other blood testing. Patient was given diagnosis of possible multiple sclerosis, treated with IV steroids, transferred to inpatient rehabilitation, and then sent home.  In retrospect patient has had at least 5-10 year history of balance and gait difficulty with intermittent falls. She has had intermittent episodes of double vision, numbness in her hands and feet, worsening memory. Some symptoms date back to 1995. Unfortunately patient did not  seek medical attention for these issues over the years.  Since discharge patient has been stable. She still has significant gait difficulty and uses a single-point cane for ambulation. She's not back to work.  REVIEW OF SYSTEMS: Full 14 system review of systems performed and negative except: only as per HPI.    ALLERGIES: No Known Allergies   HOME MEDICATIONS: Outpatient Medications Prior to Visit  Medication Sig Dispense Refill   acetaminophen (TYLENOL) 325 MG tablet Take 650 mg by mouth every 6 (six) hours as needed.     amLODipine (NORVASC) 5 MG tablet Take 1 tablet by mouth daily.     aspirin EC 81 MG tablet Take 81 mg by mouth daily as needed.     Brewers Yeast 487.5 MG TABS Take by mouth 2 times daily. 1 in am , 2 at evening     cholecalciferol (D-VI-SOL) 400 UNIT/ML LIQD Take 400 Units by mouth daily.     dalfampridine (AMPYRA) 10 MG TB12 Take one pill by mouth twice daily     diclofenac Sodium (VOLTAREN) 1 % GEL Apply 2 g topically 4 (four) times daily. 100 g 1   fish oil-omega-3 fatty acids 1000 MG capsule Take 2 g by mouth daily.     MAGNESIUM CITRATE PO Take by mouth.     melatonin 5 MG TABS Take 5 mg by mouth. Takes 5-10 mg as needed     traMADol (ULTRAM) 50 MG tablet Take 1 tablet (50 mg total) by mouth every 6 (six) hours as needed. for pain 60 tablet 0   No facility-administered medications prior to visit.    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[place])     PAST SURGICAL HISTORY: Past Surgical History:  Procedure Laterality Date   COLONOSCOPY N/A 04/20/2012   Procedure: COLONOSCOPY;  Surgeon: Michelle Bears, [place];  Location: [place];  Service: Gastroenterology;  Laterality: N/A;   CYSTECTOMY     in HS   TONSILLECTOMY      FAMILY HISTORY: Family History  Problem Relation Age of Onset   Hypertension Mother    Breast cancer Mother 71    Hypertension Father    Stroke Father    Hypertension Sister    Stroke Sister    Breast cancer Sister    Hypertension Brother    Cancer - Other Maternal Grandmother        stomach   Multiple sclerosis Cousin     SOCIAL HISTORY:  Social History   Socioeconomic History   Marital status: Divorced    Spouse name: Not on file   Number of children: 2   Years of education: B.S.   Highest education level: Not on file  Occupational History    Employer: UNEMPLOYED    Comment: [employer], part-time  Tobacco Use   Smoking status: Former    Packs/day: 0.25    Years: 15.00    Total pack years: 3.75    Types: Cigarettes    Quit date: 02/04/1984    Years since quitting: 37.7   Smokeless tobacco: Never  Substance and Sexual Activity   Alcohol use: Yes    Comment: Wine Occassionally   Drug use: No    Types: Marijuana    Comment: "I'm around it", quit using 04/05/2012   Sexual activity: Not on file  Other Topics Concern   Not on file  Social History Narrative   11/04/21 Pt lives at home with her daughter, Michelle Hodges.   Caffeine Use- She does drink coffee.   Social Determinants of Health   Financial Resource Strain: Not on file  Food Insecurity: Not on file  Transportation Needs: Not on file  Physical Activity: Not on file  Stress: Not on file  Social Connections: Not on file  Intimate Partner Violence: Not on file     PHYSICAL EXAM  Vitals:   11/04/21 0917  BP: 132/81  Pulse: 68  Weight: 124 lb (56.2 kg)  Height: 5\' 9"  (1.753 m)   Body mass index is 18.31 kg/m.  GENERAL EXAM: Patient is in no distress  CARDIOVASCULAR: Regular rate and rhythm, no murmurs, no carotid bruits  NEUROLOGIC: MENTAL STATUS: awake, alert, language fluent, comprehension intact, naming intact; SOMEWHAT REPETITIVE AT TIMES, THEN TANGENTIAL AT OTHER TIMES.  CRANIAL NERVE: no papilledema on fundoscopic exam, pupils equal and reactive to light, visual fields full to confrontation, extraocular  muscles intact, NYSTAGMUS ON END GAZE, SACCADIC DYSMETRIA, facial sensation and strength symmetric, uvula midline, shoulder shrug symmetric, tongue midline. MOTOR: INCREASED TONE IN BLE. BUE 4. BLE 3.  SENSORY: normal and symmetric to light touch COORDINATION: finger-nose-finger, fine finger movements SLOW WITH DYSMETRIA REFLEXES: deep tendon reflexes BRISK IN BLE and symmetric GAIT/STATION: SLOW UNSTEADY GAIT; USING ROLLATOR   DIAGNOSTIC DATA (LABS, IMAGING, TESTING) - I reviewed patient records, labs, notes, testing and imaging myself where available.  Lab Results  Component Value Date   WBC 7.4 04/22/2012   HGB 11.6 (L) 04/22/2012   HCT 34.3 (L) 04/22/2012   MCV 90.3 04/22/2012   PLT 200 04/22/2012  Component Value Date/Time   NA 145 04/22/2012 0643   K 3.9 04/22/2012 0643   CL 106 04/22/2012 0643   CO2 32 04/22/2012 0643   GLUCOSE 88 04/22/2012 0643   GLUCOSE 97 11/18/2005 1113   BUN 10 04/22/2012 0643   CREATININE 0.90 04/22/2012 0643   CALCIUM 9.0 04/22/2012 0643   PROT 5.8 (L) 04/22/2012 0643   ALBUMIN 2.9 (L) 04/22/2012 0643   AST 15 04/22/2012 0643   ALT 24 04/22/2012 0643   ALKPHOS 39 04/22/2012 0643   BILITOT 0.7 04/22/2012 0643   GFRNONAA 66 (L) 04/22/2012 0643   GFRAA 76 (L) 04/22/2012 0643   Lab Results  Component Value Date   CHOL 154 04/16/2012   HDL 56 04/16/2012   LDLCALC 84 04/16/2012   TRIG 71 04/16/2012   CHOLHDL 2.8 04/16/2012   Lab Results  Component Value Date   HGBA1C 5.6 04/16/2012   Lab Results  Component Value Date   VITAMINB12 493 04/15/2012   Lab Results  Component Value Date   TSH 3.985 04/15/2012    CSF studies (04/19/12): OCB: >5 well defined gamma restriction bands that are not present in the patient's corresponding serum sample MBP < 2 WBC 4, RBC 990, Glucose 88, Protein 24 Cultures negative  UDS - positive THC JCV antibody - 0.35 (H) ANA - negative ACE - 51 pANCA, cANCA - normal NMO-IgG - < 1.6  (normal)  04/15/12 MRI brain - multiple periventricular and subcortical T2 hyperintense lesions  04/16/12 MRI cervical spine - T2 hyperintense spinal cord lesions at C3, C4, T2 on the right, and C5 on the left  03/31/15 MRI brain Findings consistent with chronic cerebral multiple sclerosis. No definite new or acute areas of intracranial demyelination are observed.   If clinically indicated, cervical and thoracic MRI without and with contrast could reveal new/acute areas of cord involvement, if present.    ASSESSMENT AND PLAN  76 y.o. year old female  has a past medical history of History of blood transfusion, Hypertension, MS (multiple sclerosis) (HCC), and Senile dementia (HCC). here with multiple sclerosis. 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  PLAN:  PROGRESSIVE MULTIPLE SCLEROSIS (primary progressive) - Given age, progression, memory loss, would recommend to continue supportive / palliative care; no role for DMT at this time; also patient not that interested in DMT; follow up with PCP - I will refer to home health therapy  Orders Placed This Encounter  Procedures   Ambulatory referral to Home Health   Return for return to PCP, pending if symptoms worsen or fail to improve.    Suanne Marker, MD 11/04/2021, 9:45 AM Certified in Neurology, Neurophysiology and Neuroimaging  [facility] [address] [place], [place] [address] [phone]

## 2021-11-05 ENCOUNTER — Telehealth: Payer: Self-pay | Admitting: Diagnostic Neuroimaging

## 2021-11-05 NOTE — Telephone Encounter (Signed)
[facility] sent me a message stating they are able to take this patient.

## 2021-11-05 NOTE — Telephone Encounter (Signed)
Sent to Brittany with [facility] to see if she will be able to take the patient.  

## 2021-11-25 ENCOUNTER — Telehealth: Payer: Self-pay | Admitting: Diagnostic Neuroimaging

## 2021-11-25 NOTE — Telephone Encounter (Signed)
Called patient, daughter Ebony Hail on [place] answered phone, stated mom fell getting out of rocker chair.  Mom didn't hit head, is fine. HH put in request for hospital bed due to patient falling, balance issues, request will be sent to Dr Leta Baptist.  Daughter verbalized understanding, appreciation for call.

## 2021-11-25 NOTE — Telephone Encounter (Signed)
Michelle Hodges is calling from [facility]. Stated Pt fell last Friday while trying to get in her chair. Said Pt got up on her own.

## 2021-12-12 ENCOUNTER — Telehealth: Payer: Self-pay | Admitting: Diagnostic Neuroimaging

## 2021-12-12 DIAGNOSIS — G35D Multiple sclerosis, unspecified: Secondary | ICD-10-CM

## 2021-12-12 DIAGNOSIS — G35 Multiple sclerosis: Secondary | ICD-10-CM

## 2021-12-12 DIAGNOSIS — R2681 Unsteadiness on feet: Secondary | ICD-10-CM

## 2021-12-12 NOTE — Telephone Encounter (Signed)
Rollator Rx signed, faxed to [facility]. Received confirmation.

## 2021-12-12 NOTE — Telephone Encounter (Signed)
Rollator order on [place] desk for signature.

## 2021-12-12 NOTE — Telephone Encounter (Signed)
[facility] Tresa Endo) requesting if neurologist can send a prescription for a rolator to [employer] [facility]. Can call back if have any questions.   Fax; [phone]

## 2021-12-16 NOTE — Telephone Encounter (Signed)
Rq for Hight/Weight, OV notes within last 6 months that states the need of Rollator Walker be faxed to [facility]. Please addend notes to state need of walker.

## 2021-12-17 NOTE — Telephone Encounter (Signed)
I have asked [place] to addend notes to state need of walker. Waiting on addendum

## 2021-12-17 NOTE — Addendum Note (Signed)
Addended by: Joycelyn Schmid R on: 12/17/2021 04:46 PM   Modules accepted: Orders

## 2021-12-17 NOTE — Telephone Encounter (Signed)
Pt's daughter also called in reference to a hospital bed. I have copied from previous phone message. When you addend the office note for a Rolater Walker, can you also add the need for hospital bed.

## 2021-12-17 NOTE — Addendum Note (Signed)
Addended by: Judi Cong on: 12/17/2021 03:47 PM   Modules accepted: Orders

## 2021-12-17 NOTE — Telephone Encounter (Signed)
Pt's daughter, [name] checking on the status of a request for a hospital bed. Would like a call from the nurse.

## 2021-12-17 NOTE — Telephone Encounter (Signed)
Pt's daughter, Pryor Montes; They are waiting on the OV notes to process request for Rollator.  Fax: [phone] will be send to intake department.

## 2021-12-17 NOTE — Telephone Encounter (Signed)
I have added this information into the other phone note for the patient in reference to her walker as well so that Dr Marjory Lies can address both calls.

## 2021-12-17 NOTE — Telephone Encounter (Signed)
Orders Placed This Encounter  Procedures   For home use only DME 4 wheeled rolling walker with seat   For home use only DME Hospital bed    Suanne Marker, MD 12/17/2021, 4:46 PM Certified in Neurology, Neurophysiology and Neuroimaging  [facility] [address], [address] [place], [place] [address] [phone]

## 2021-12-18 NOTE — Telephone Encounter (Signed)
Faxed notes and orders to [facility]. Received confirmation

## 2021-12-18 NOTE — Progress Notes (Signed)
Pt has declined in her mobility and gait. We have sent a referral to home health and they agree a rollator walker will be beneficial to help her with getting around and preventing further falls. Home health also suggest that due to recent falls, Ms. Michelle Hodges would also benefit with using a hospital bed with rails. This would allow her to be able to adjust the bed as necessary to assist with getting in and out of bed without risk of falling. Both of these are necessary and beneficial to the pt. We will work on getting this equipment for Ms. Michelle Hodges.

## 2021-12-18 NOTE — Telephone Encounter (Signed)
Order printed. Notes corrected to reflect the information requested by [facility]. and updated and faxed to [facility].   Called the daughter to advise everything has been resent to [facility]. There was no answer. LVM with detailed message informing the patient's daughter we received her messages. Advised we were waiting on note to be corrected per instructions by [facility]. The orders, updated notes were sent to [facility] this morning. Confirmation of fax received. Advised her to call back if she needed anything else.

## 2022-01-02 ENCOUNTER — Telehealth: Payer: Self-pay | Admitting: Diagnostic Neuroimaging

## 2022-01-02 DIAGNOSIS — G35 Multiple sclerosis: Secondary | ICD-10-CM

## 2022-01-02 NOTE — Telephone Encounter (Signed)
Received confirmation that both faxes went through.

## 2022-01-02 NOTE — Telephone Encounter (Signed)
Michelle Hodges is calling. Stated she would like to talk to nurse about getting a prescription for hospital bed. Michelle Hodges is requesting a call-back.

## 2022-01-02 NOTE — Telephone Encounter (Signed)
Called Kim back. She states that [facility] never received the hospital bed order. Advised I would refax this. She requested also to get incontinence supplies. I will add this and send along with the other order. I will fax this to both [facility] fax #'s I have. [phone] and [phone]

## 2022-01-08 ENCOUNTER — Telehealth: Payer: Self-pay | Admitting: Diagnostic Neuroimaging

## 2022-01-08 NOTE — Telephone Encounter (Signed)
PT Michelle Hodges with [facility] has called for verbal orders for home PT for 1 week 4, her vm is secure

## 2022-01-08 NOTE — Telephone Encounter (Signed)
Contacted Michelle Hodges back, LVM as rq giving VO for PT 1X week 4.

## 2022-01-29 ENCOUNTER — Encounter: Payer: Self-pay | Admitting: Nurse Practitioner

## 2022-01-31 ENCOUNTER — Ambulatory Visit (INDEPENDENT_AMBULATORY_CARE_PROVIDER_SITE_OTHER): Payer: Medicare Other | Admitting: Nurse Practitioner

## 2022-01-31 ENCOUNTER — Encounter: Payer: Self-pay | Admitting: Nurse Practitioner

## 2022-01-31 VITALS — BP 130/90 | HR 70 | Temp 97.1°F | Ht 68.5 in | Wt 126.0 lb

## 2022-01-31 DIAGNOSIS — E7800 Pure hypercholesterolemia, unspecified: Secondary | ICD-10-CM

## 2022-01-31 DIAGNOSIS — Z1211 Encounter for screening for malignant neoplasm of colon: Secondary | ICD-10-CM | POA: Diagnosis not present

## 2022-01-31 DIAGNOSIS — F03A0 Unspecified dementia, mild, without behavioral disturbance, psychotic disturbance, mood disturbance, and anxiety: Secondary | ICD-10-CM

## 2022-01-31 DIAGNOSIS — I10 Essential (primary) hypertension: Secondary | ICD-10-CM

## 2022-01-31 DIAGNOSIS — G35 Multiple sclerosis: Secondary | ICD-10-CM

## 2022-01-31 DIAGNOSIS — Z1212 Encounter for screening for malignant neoplasm of rectum: Secondary | ICD-10-CM

## 2022-01-31 NOTE — Progress Notes (Signed)
Careteam: Patient Care Team: Lauree Chandler, NP as PCP - General (Geriatric Medicine)  PLACE OF SERVICE:  [place] Directive information Does Patient Have a Medical Advance Directive?: No, Would patient like information on creating a medical advance directive?: Yes (MAU/Ambulatory/Procedural Areas - Information given)  No Known Allergies  Chief Complaint  Patient presents with   Establish Care    New patient to establish care. Pill bottles not present at initial appointment. NCIR verified. No ACP in epic. Discuss blood pressure medications. Patient is getting PT through [facility], [place]. High fall risk. Here with Ebony Hail, daughter      HPI: Patient is a 76 y.o. female to establish care.  She was previously going to [facility]- just one visit- recently has relocated from [place] (July 2023) She moved to be closer to her daughter.   MS- will use tylenol PRN if she has body aches   Hypertension- on norvasc 5 mg by mouth daily-  120/80 at home occasionally will go up but will improve on next check generally.   Using biotin for hair and nails.   Unsure why she is taking fish oil,   Magnesium gluconate for calming and sleep in addition to melatonin   Daughter thinks she tested low for Vit D   Review of Systems:  Review of Systems  Constitutional:  Negative for chills, fever and weight loss.  HENT:  Negative for tinnitus.   Respiratory:  Negative for cough, sputum production and shortness of breath.   Cardiovascular:  Negative for chest pain, palpitations and leg swelling.  Gastrointestinal:  Negative for abdominal pain, constipation, diarrhea and heartburn.  Genitourinary:  Negative for dysuria, frequency and urgency.  Musculoskeletal:  Negative for back pain, falls, joint pain and myalgias.  Skin: Negative.   Neurological:  Negative for dizziness and headaches.  Psychiatric/Behavioral:  Positive for memory loss. Negative for depression.  The patient does not have insomnia.     Past Medical History:  Diagnosis Date   History of blood transfusion    Hypertension    MS (multiple sclerosis) ([place])    Senile dementia ([place])    Past Surgical History:  Procedure Laterality Date   COLONOSCOPY N/A 04/20/2012   Procedure: COLONOSCOPY;  Surgeon: Jerene Bears, MD;  Location: [place];  Service: Gastroenterology;  Laterality: N/A;   CYSTECTOMY     in HS   TONSILLECTOMY     Social History:   reports that she quit smoking about 38 years ago. Her smoking use included cigarettes. She has a 3.75 pack-year smoking history. She has never used smokeless tobacco. She reports current alcohol use. She reports current drug use. Drug: Marijuana.  Family History  Problem Relation Age of Onset   Hypertension Mother    Breast cancer Mother 87   Hypertension Father    Stroke Father    Hypertension Sister    Stroke Sister    Breast cancer Sister    Hypertension Brother    Cancer - Other Maternal Grandmother        stomach   Multiple sclerosis Cousin     Medications: Patient's Medications  New Prescriptions   No medications on file  Previous Medications   ACETAMINOPHEN (TYLENOL) 325 MG TABLET    Take 650 mg by mouth every 6 (six) hours as needed.   AMLODIPINE (NORVASC) 5 MG TABLET    Take 1 tablet by mouth daily.   BIOTIN 83419 MCG TABS    Take 1  tablet by mouth daily.   FISH OIL-OMEGA-3 FATTY ACIDS 1000 MG CAPSULE    Take 2 g by mouth daily.   MAGNESIUM GLUCONATE (MAGONATE) 500 MG TABLET    Take 500 mg by mouth as needed.   MELATONIN 5 MG TABS    Take 1-2 tablets by mouth at bedtime.   UNABLE TO FIND    Med Name:Cell Food 8 drops into water three times daily   VITAMIN D-VITAMIN K (VITAMIN K2-VITAMIN D3 PO)    Take 1 tablet by mouth daily.  Modified Medications   No medications on file  Discontinued Medications   MAGNESIUM CITRATE PO    Take by mouth as needed.    Physical Exam:  Vitals:   01/31/22 0929 01/31/22 1011  BP:  (!) 142/90 (!) 130/90  Pulse: 70   Temp: (!) 97.1 F (36.2 C)   TempSrc: Temporal   SpO2: 99%   Weight: 126 lb (57.2 kg)   Height: 5' 8.5" (1.74 m)    Body mass index is 18.88 kg/m. Wt Readings from Last 3 Encounters:  01/31/22 126 lb (57.2 kg)  11/04/21 124 lb (56.2 kg)  07/25/13 123 lb (55.8 kg)    Physical Exam Constitutional:      General: She is not in acute distress.    Appearance: She is well-developed. She is not diaphoretic.  HENT:     Head: Normocephalic and atraumatic.     Mouth/Throat:     Pharynx: No oropharyngeal exudate.  Eyes:     Conjunctiva/sclera: Conjunctivae normal.     Pupils: Pupils are equal, round, and reactive to light.  Cardiovascular:     Rate and Rhythm: Normal rate and regular rhythm.     Heart sounds: Normal heart sounds.  Pulmonary:     Effort: Pulmonary effort is normal.     Breath sounds: Normal breath sounds.  Abdominal:     General: Bowel sounds are normal.     Palpations: Abdomen is soft.  Musculoskeletal:     Cervical back: Normal range of motion and neck supple.     Right lower leg: No edema.     Left lower leg: No edema.  Skin:    General: Skin is warm and dry.  Neurological:     Mental Status: She is alert.     Gait: Gait abnormal (uses walker).  Psychiatric:        Mood and Affect: Mood normal.     Labs reviewed: Basic Metabolic Panel: No results for input(s): "NA", "K", "CL", "CO2", "GLUCOSE", "BUN", "CREATININE", "CALCIUM", "MG", "PHOS", "TSH" in the last 8760 hours. Liver Function Tests: No results for input(s): "AST", "ALT", "ALKPHOS", "BILITOT", "PROT", "ALBUMIN" in the last 8760 hours. No results for input(s): "LIPASE", "AMYLASE" in the last 8760 hours. No results for input(s): "AMMONIA" in the last 8760 hours. CBC: No results for input(s): "WBC", "NEUTROABS", "HGB", "HCT", "MCV", "PLT" in the last 8760 hours. Lipid Panel: No results for input(s): "CHOL", "HDL", "LDLCALC", "TRIG", "CHOLHDL", "LDLDIRECT" in  the last 8760 hours. TSH: No results for input(s): "TSH" in the last 8760 hours. A1C: Lab Results  Component Value Date   HGBA1C 5.6 04/16/2012     Assessment/Plan 1. Multiple sclerosis ([place]) Progressive MS, has followed with neurologist in [place]. Continues with home health therapy due to progressive weakness.   2. HYPERCHOLESTEROLEMIA -noted in chart, recent LDL at goal - CMP with eGFR([facility])  3. Primary hypertension -elevated but improved on recheck. Continue norvasc 5 mg daily  - CMP  with eGFR([facility]) - CBC with Differential/Platelet  4. Encounter for colorectal cancer screening - Cologuard  5. Mild dementia without behavioral disturbance, psychotic disturbance, mood disturbance, or anxiety, unspecified dementia type ([place]) -will follow up MMSE on follow up, progressive disease. Lives with daughter now, continue home support.    Return in about 6 weeks (around 03/14/2022) for follow up-MMSE . [employer]. [place], [facility] [phone]

## 2022-01-31 NOTE — Patient Instructions (Signed)
To get record release from [facility] and prior PCP in [place]

## 2022-02-01 LAB — COMPLETE METABOLIC PANEL WITH GFR
AG Ratio: 1.4 (calc) (ref 1.0–2.5)
ALT: 11 U/L (ref 6–29)
AST: 19 U/L (ref 10–35)
Albumin: 4.4 g/dL (ref 3.6–5.1)
Alkaline phosphatase (APISO): 54 U/L (ref 37–153)
BUN: 12 mg/dL (ref 7–25)
CO2: 27 mmol/L (ref 20–32)
Calcium: 9.9 mg/dL (ref 8.6–10.4)
Chloride: 108 mmol/L (ref 98–110)
Creat: 0.81 mg/dL (ref 0.60–1.00)
Globulin: 3.1 g/dL (calc) (ref 1.9–3.7)
Glucose, Bld: 87 mg/dL (ref 65–139)
Potassium: 4 mmol/L (ref 3.5–5.3)
Sodium: 143 mmol/L (ref 135–146)
Total Bilirubin: 1.1 mg/dL (ref 0.2–1.2)
Total Protein: 7.5 g/dL (ref 6.1–8.1)
eGFR: 76 mL/min/{1.73_m2} (ref >=60)

## 2022-02-01 LAB — CBC WITH DIFFERENTIAL/PLATELET
Absolute Monocytes: 413 cells/uL (ref 200–950)
Basophils Absolute: 7 cells/uL (ref 0–200)
Basophils Relative: 0.1 %
Eosinophils Absolute: 119 cells/uL (ref 15–500)
Eosinophils Relative: 1.7 %
HCT: 39.4 % (ref 35.0–45.0)
Hemoglobin: 13.2 g/dL (ref 11.7–15.5)
Lymphs Abs: 2149 cells/uL (ref 850–3900)
MCH: 30.8 pg (ref 27.0–33.0)
MCHC: 33.5 g/dL (ref 32.0–36.0)
MCV: 91.8 fL (ref 80.0–100.0)
MPV: 12.2 fL (ref 7.5–12.5)
Monocytes Relative: 5.9 %
Neutro Abs: 4312 cells/uL (ref 1500–7800)
Neutrophils Relative %: 61.6 %
Platelets: 220 10*3/uL (ref 140–400)
RBC: 4.29 10*6/uL (ref 3.80–5.10)
RDW: 13.1 % (ref 11.0–15.0)
Total Lymphocyte: 30.7 %
WBC: 7 10*3/uL (ref 3.8–10.8)

## 2022-02-01 LAB — LIPID PANEL
Cholesterol: 198 mg/dL (ref <200)
HDL: 75 mg/dL (ref >=50)
LDL Cholesterol (Calc): 109 mg/dL (calc) — ABNORMAL HIGH
Non-HDL Cholesterol (Calc): 123 mg/dL (calc) (ref <130)
Total CHOL/HDL Ratio: 2.6 (calc) (ref <5.0)
Triglycerides: 55 mg/dL (ref <150)

## 2022-02-04 ENCOUNTER — Telehealth: Payer: Self-pay | Admitting: Diagnostic Neuroimaging

## 2022-02-04 DIAGNOSIS — G35 Multiple sclerosis: Secondary | ICD-10-CM

## 2022-02-04 DIAGNOSIS — R2681 Unsteadiness on feet: Secondary | ICD-10-CM

## 2022-02-04 NOTE — Telephone Encounter (Signed)
[facility] Linna Hoff) Pt contacted [place] for occupational therapy, physical therapy. Could physician fax a referral for OT and PT with recent medical notes. Can call back if have any questions.  Fax: [phone].

## 2022-02-04 NOTE — Telephone Encounter (Signed)
Called Dan to confirm. The pt is needed a referral for PT/OT outpatient and the October office notes are sufficient.  I have printed this referral order along with recent ov notes and faxed to information provided.

## 2022-02-18 ENCOUNTER — Other Ambulatory Visit: Payer: Self-pay | Admitting: Nurse Practitioner

## 2022-02-18 DIAGNOSIS — Z1231 Encounter for screening mammogram for malignant neoplasm of breast: Secondary | ICD-10-CM

## 2022-03-14 ENCOUNTER — Ambulatory Visit (INDEPENDENT_AMBULATORY_CARE_PROVIDER_SITE_OTHER): Payer: Medicare Other | Admitting: Nurse Practitioner

## 2022-03-14 ENCOUNTER — Encounter: Payer: Self-pay | Admitting: Nurse Practitioner

## 2022-03-14 VITALS — BP 130/70 | HR 68 | Temp 97.8°F | Resp 16 | Ht 68.5 in | Wt 125.8 lb

## 2022-03-14 DIAGNOSIS — F03A0 Unspecified dementia, mild, without behavioral disturbance, psychotic disturbance, mood disturbance, and anxiety: Secondary | ICD-10-CM | POA: Diagnosis not present

## 2022-03-14 DIAGNOSIS — G35 Multiple sclerosis: Secondary | ICD-10-CM

## 2022-03-14 DIAGNOSIS — Z1212 Encounter for screening for malignant neoplasm of rectum: Secondary | ICD-10-CM

## 2022-03-14 DIAGNOSIS — Z1211 Encounter for screening for malignant neoplasm of colon: Secondary | ICD-10-CM

## 2022-03-14 DIAGNOSIS — I10 Essential (primary) hypertension: Secondary | ICD-10-CM | POA: Diagnosis not present

## 2022-03-14 DIAGNOSIS — Z23 Encounter for immunization: Secondary | ICD-10-CM

## 2022-03-14 DIAGNOSIS — H6123 Impacted cerumen, bilateral: Secondary | ICD-10-CM

## 2022-03-14 NOTE — Patient Instructions (Signed)
Blood pressure <140/90 is goal.   To sign record release for [facility] physicians and [facility] medical.

## 2022-03-14 NOTE — Progress Notes (Signed)
Careteam: Patient Care Team: Lauree Chandler, NP as PCP - General (Geriatric Medicine)  PLACE OF SERVICE:  [place] Directive information Does Patient Have a Medical Advance Directive?: No  No Known Allergies  Chief Complaint  Patient presents with   Medical Management of Chronic Issues    Patient is being seen for a 6 week follow up.   Immunizations    Discussed the need for Pneumonia, tdap, shingles and flu vaccine. NCIR verified    Quality Metric Gaps    Patient due for AWV and Hep C screening     HPI: Patient is a 77 y.o. female here for routine follow-up.  BP is elevated today. BP cuff at home is broken but at home prior to that they have been running in the 'good' or 'orange' range; the cuff gives different ranges such as good, yellow, orange, red. She is eating out quite a bit and tries to eat healthy but she also likes to eat sardines.   She is active and walks daily. She has MS and sees the physical therapist about twice a week. She will be seeing occupational therapy as well.   Denies constipation or diarrhea. No issues with voiding.   She is not sleeping well. She reports she is hearing noise at night. Daughter says the neurologist has recommended an MRI as this could be related to the MS. However the patient is declining the MRI at this time. Daughter reports she plays music on youtube all night long. She sometimes gets up to pee. She does get about 6-7 hours a night, and she is able to fall asleep easily and stay asleep. If she wakes up to urinate, she is able to fall back asleep within a few minutes.Sometimes she takes the melatonin which helps.   Patient and daughter seem to have some disagreements about her care. Daughter attempts to help her with medications and care but she does not always agree.   Denies SOB, CP, dizziness, numbness, tingling, headaches, or swelling today.  Review of Systems:  Review of Systems  Constitutional:  Negative  for chills, fever, malaise/fatigue and weight loss.  HENT:  Negative for congestion and sore throat.   Eyes:  Negative for blurred vision.  Respiratory:  Negative for cough, shortness of breath and wheezing.   Cardiovascular:  Negative for chest pain, palpitations and leg swelling.  Gastrointestinal:  Negative for abdominal pain, blood in stool, constipation, diarrhea, heartburn, nausea and vomiting.  Genitourinary:  Negative for dysuria, frequency, hematuria and urgency.  Musculoskeletal:  Negative for falls and joint pain.  Skin:  Negative for rash.  Neurological:  Negative for dizziness, tingling and headaches.  Endo/Heme/Allergies:  Negative for polydipsia.  Psychiatric/Behavioral:  Negative for depression. The patient is not nervous/anxious.     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 Her smoking use included cigarettes. She has a 3.75 pack-year smoking history. She has never used smokeless tobacco. She reports current alcohol use. She reports current  drug use. Drug: Marijuana.  Family History  Problem Relation Age of Onset   Hypertension Mother    Breast cancer Mother 60   Hypertension Father    Stroke Father    Hypertension Sister    Stroke Sister    Breast cancer Sister    Hypertension Brother    Cancer - Other Maternal Grandmother        stomach   Multiple sclerosis Cousin     Medications: Patient's Medications  New Prescriptions   No medications on file  Previous Medications   ACETAMINOPHEN (TYLENOL) 325 MG TABLET    Take 650 mg by mouth every  6 (six) hours as needed.   AMLODIPINE (NORVASC) 5 MG TABLET    Take 1 tablet by mouth daily.   BIOTIN 21308 MCG TABS    Take 1 tablet by mouth daily.   FISH OIL-OMEGA-3 FATTY ACIDS 1000 MG CAPSULE    Take 2 g by mouth daily.   MAGNESIUM GLUCONATE (MAGONATE) 500 MG TABLET    Take 500 mg by mouth as needed.   MELATONIN 5 MG TABS    Take 1-2 tablets by mouth at bedtime.   UNABLE TO FIND    Med Name:Cell Food 8 drops into water three times daily   VITAMIN D-VITAMIN K (VITAMIN K2-VITAMIN D3 PO)    Take 1 tablet by mouth daily.  Modified Medications   No medications on file  Discontinued Medications   No medications on file    Physical Exam:  Vitals:   03/14/22 0853 03/14/22 0859 03/14/22 1648  BP: (!) 150/90 (!) 150/82 130/70  Pulse: 68    Resp: 16    Temp: 97.8 F (36.6 C)    TempSrc: Temporal    SpO2: 98%    Weight: 125 lb 12.8 oz (57.1 kg)    Height: 5' 8.5" (1.74 m)     Body mass index is 18.85 kg/m. Wt Readings from Last 3 Encounters:  03/14/22 125 lb 12.8 oz (57.1 kg)  01/31/22 126 lb (57.2 kg)  11/04/21 124 lb (56.2 kg)    Physical Exam Constitutional:      General: She is not in acute distress.    Appearance: Normal appearance.  Cardiovascular:     Rate and Rhythm: Normal rate and regular rhythm.  Pulmonary:     Effort: No respiratory distress.     Breath sounds: Normal breath sounds.  Abdominal:     General: Bowel sounds are normal. There is no distension.     Palpations: Abdomen is soft. There is no mass.     Tenderness: There is no abdominal tenderness. There is no guarding.  Musculoskeletal:     Cervical back: Neck supple.  Lymphadenopathy:     Cervical: No cervical adenopathy.  Skin:    General: Skin is warm and dry.  Neurological:     Mental Status: She is alert and oriented to person, place, and time.     Comments: Forgetful. MMSE score 24  Psychiatric:        Mood and Affect: Mood normal.    Labs reviewed: Basic Metabolic Panel: Recent Labs     01/31/22 1015  NA 143  K 4.0  CL 108  CO2 27  GLUCOSE 87  BUN 12  CREATININE 0.81  CALCIUM 9.9   Liver Function Tests: Recent Labs    01/31/22 1015  AST 19  ALT 11  BILITOT 1.1  PROT 7.5   No results for input(s): "LIPASE", "AMYLASE" in the last  8760 hours. No results for input(s): "AMMONIA" in the last 8760 hours. CBC: Recent Labs    01/31/22 1015  WBC 7.0  NEUTROABS 4,312  HGB 13.2  HCT 39.4  MCV 91.8  PLT 220   Lipid Panel: Recent Labs    01/31/22 1015  CHOL 198  HDL 75  LDLCALC 109*  TRIG 55  CHOLHDL 2.6   TSH: No results for input(s): "TSH" in the last 8760 hours. A1C: Lab Results  Component Value Date   HGBA1C 5.6 04/16/2012     Assessment/Plan 1. Primary hypertension BP elevated today originally but improved on recheck. The patient is slightly agitated today due to disagreements with her daughter about what is going on at home. Daughter reports BP are not this elevated at home, highest are [id number] systolic, and sometimes [id number]. Their cuff has been broken; encouraged to get a new cuff and monitor BP 2-3 times a week at the same time daily and report back.  Potentially due to patient's dementia, daughter is having some difficulty managing patient's care. This could cause tension and elevated BP Continue to monitor.   2. Multiple sclerosis [facility]) Daughter reports that the patient declines imaging and recommendations by neurologist at this time  3. 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 Encounter for colorectal cancer screening - Ambulatory referral to Gastroenterology  5. Bilateral impacted cerumen -ear lavage bilaterally completed and tolerated well.   6. Need for influenza vaccination - Flu Vaccine QUAD High Dose(Fluad)   Return in about 6 months (around 09/12/2022) for routine follow up .  Student-  Archer Asa O'Berry ACPCNP-S  I personally was present during the history, physical exam and medical decision-making activities of this service and have verified that the service and findings are accurately documented in the student's note Margrit Minner K. [place], [facility] [phone]

## 2022-04-02 ENCOUNTER — Other Ambulatory Visit: Payer: Self-pay | Admitting: Neurology

## 2022-04-02 ENCOUNTER — Telehealth: Payer: Self-pay | Admitting: Diagnostic Neuroimaging

## 2022-04-02 DIAGNOSIS — G35 Multiple sclerosis: Secondary | ICD-10-CM

## 2022-04-02 DIAGNOSIS — N3945 Continuous leakage: Secondary | ICD-10-CM

## 2022-04-02 DIAGNOSIS — R2681 Unsteadiness on feet: Secondary | ICD-10-CM

## 2022-04-02 NOTE — Telephone Encounter (Signed)
Received a fax from [employer] [facility] stating that the need more information to process the order. They are asking what type of supplies and what size.  I reached out to [facility] who I spoke with before on this and didn't realize she was a SW with [facility]. She has advised to reach out to [employer] [facility] through community message and get guidance that way.

## 2022-04-02 NOTE — Telephone Encounter (Signed)
Pt's daughter states since Dr Leta Baptist initially submitted Rx for incontinence supplies,she is asking if this is something that can be done monthly for pt since she is out. Please call to address

## 2022-04-02 NOTE — Telephone Encounter (Signed)
Order placed for the patient to get incontinence supplies. It is good for up to year. Order sent to [facility] via fax, [phone] and [phone].

## 2022-04-14 NOTE — Telephone Encounter (Signed)
Pt daughter called and stated she needs an order send for incontinence supplies sent monthly. Please sent to [facility] since pt was release from [facility]

## 2022-04-15 ENCOUNTER — Other Ambulatory Visit: Payer: Self-pay | Admitting: Neurology

## 2022-04-15 DIAGNOSIS — N3945 Continuous leakage: Secondary | ICD-10-CM

## 2022-04-15 DIAGNOSIS — G35D Multiple sclerosis, unspecified: Secondary | ICD-10-CM

## 2022-04-15 DIAGNOSIS — G35 Multiple sclerosis: Secondary | ICD-10-CM

## 2022-04-15 DIAGNOSIS — R2681 Unsteadiness on feet: Secondary | ICD-10-CM

## 2022-04-15 NOTE — Addendum Note (Signed)
Addended by: Darleen Crocker on: 04/15/2022 11:02 AM   Modules accepted: Orders

## 2022-04-15 NOTE — Telephone Encounter (Signed)
[facility]- Physician Office [phone] Customer [phone]

## 2022-04-15 NOTE — Telephone Encounter (Signed)
Called the daughter back as I do not have contact information for [employer]. I am not familiar with them. Advised the daughter that when [employer] reached out they asked what size Ms. Harral was and advised we would need to document that on order. I will place the order but the daughter is going to see if she can get a phone and fax number to where the order should go to.  ** When daughter calls back, she was going to provide me phone and fax number to where the order should go, [facility].

## 2022-04-15 NOTE — Telephone Encounter (Signed)
Called the number provided and was transferred to the number [phone] to discuss new orders. Pt was not established through [employer]. I confirmed demographics, insurance and order was all that was needed and they advised that was correct. The fax number provided to send new orders was [phone]. Received confirmation that it went through

## 2022-04-15 NOTE — Telephone Encounter (Signed)
Received a notification from the company a new script for product and size was needed. They are asking for item numbers but I dont have that information. I will re submit with the size small which is the size the daughter advised she was in.

## 2022-04-16 NOTE — Telephone Encounter (Signed)
Called the daughter to advise I received fax back from [employer] stating that insurance doesn't cover these supplies she must go through a local DME. Advised the daughter previously she used [facility] as they were part of [facility]. Advised I would reach back to [employer] [facility] and see if someone can assist

## 2022-04-21 NOTE — Telephone Encounter (Signed)
Received a response from the management team "Received an email to refill order for [facility] CMN on file. Called patient but no answer. Left voicemail and callback number. Assigned to Ermelinda Das for case management"  [facility] is working on getting supplies for her. I replied asking for contact info that can be provided to the daughter

## 2022-04-21 NOTE — Telephone Encounter (Signed)
Pt's daughter is asking for a call with a # for [employer]

## 2022-04-22 NOTE — Telephone Encounter (Signed)
Daughter called back stating kim is going to call us due to missing information.

## 2022-05-16 ENCOUNTER — Encounter: Payer: Self-pay | Admitting: Nurse Practitioner

## 2022-05-27 ENCOUNTER — Telehealth: Payer: Self-pay | Admitting: Internal Medicine

## 2022-05-27 NOTE — Telephone Encounter (Signed)
Good morning Dr. Rhea Belton,    We received a referral for this patient to be scheduled a colonoscopy. Patient last had colonoscopy with you on 04/2012 but a recall was not placed. Would you wish to scheduled colonoscopy directly or would you like to see her in the office first? Please advise on scheduling.  Thank you.

## 2022-05-28 NOTE — Telephone Encounter (Signed)
OV 1st 

## 2022-05-29 ENCOUNTER — Encounter: Payer: Self-pay | Admitting: Nurse Practitioner

## 2022-05-29 NOTE — Telephone Encounter (Signed)
LVM for patient to call back and schedule OV to discuss recall colon.

## 2022-06-03 ENCOUNTER — Telehealth: Payer: Self-pay

## 2022-06-03 DIAGNOSIS — G35 Multiple sclerosis: Secondary | ICD-10-CM

## 2022-06-03 NOTE — Telephone Encounter (Signed)
Audra,PT, with [facility] called to request order be sent to [facility] clinic prosthetic and orthotics for right leather toe cap for toe drag and foot clearance.  Message sent to Abbey Chatters, NP

## 2022-06-05 NOTE — Telephone Encounter (Signed)
Order faxed to [facility] and orthotics. [place], [place]

## 2022-06-05 NOTE — Telephone Encounter (Signed)
Rx written and signed, please fax

## 2022-06-12 ENCOUNTER — Telehealth: Payer: Self-pay

## 2022-06-12 DIAGNOSIS — G35 Multiple sclerosis: Secondary | ICD-10-CM

## 2022-06-12 NOTE — Telephone Encounter (Signed)
Bufford Lope PT with [facility] called requesting that order for off the shelf carbon fiber right AFO be sent to [facility]. [facility] recommended that patient get right AFO to help prevent foot drag.  Message sent to Abbey Chatters, NP

## 2022-06-13 NOTE — Telephone Encounter (Signed)
Rx printed

## 2022-06-13 NOTE — Telephone Encounter (Signed)
Prescription faxed to [facility]

## 2022-06-19 ENCOUNTER — Encounter: Payer: Self-pay | Admitting: Nurse Practitioner

## 2022-07-03 ENCOUNTER — Telehealth: Payer: Self-pay | Admitting: Nurse Practitioner

## 2022-07-03 NOTE — Telephone Encounter (Signed)
Patient's daughter called and would like an order/prescription sent in to [facility] for a back brace for support regarding her mom's MS/scoliosis.

## 2022-07-04 ENCOUNTER — Other Ambulatory Visit: Payer: Self-pay

## 2022-07-04 DIAGNOSIS — G35 Multiple sclerosis: Secondary | ICD-10-CM

## 2022-07-04 NOTE — Progress Notes (Signed)
Patient daughter called and states that she needs another order for carbon knee/leg brace. Message routed to PCP Janyth Contes, Janene Harvey, NP

## 2022-07-04 NOTE — Telephone Encounter (Signed)
Order faxed to [facility] clinic [phone]

## 2022-07-07 ENCOUNTER — Encounter: Payer: Medicare Other | Admitting: Nurse Practitioner

## 2022-07-07 NOTE — Progress Notes (Signed)
Please clarify exactly what is needed- she also missed appt today so AWV will need to be rescheduled. Thank you

## 2022-07-07 NOTE — Progress Notes (Signed)
Err

## 2022-07-10 NOTE — Progress Notes (Signed)
Order had already been done and faxed on 06/13/22. They stated they didn't receive it. Order was refaxed 07/10/22.

## 2022-07-30 ENCOUNTER — Other Ambulatory Visit: Payer: Self-pay | Admitting: Nurse Practitioner

## 2022-07-30 MED ORDER — AMLODIPINE BESYLATE 5 MG PO TABS: 5.0000 mg | ORAL_TABLET | Freq: Every day | ORAL | 1 refills | Status: DC

## 2022-08-01 ENCOUNTER — Encounter: Payer: Self-pay | Admitting: Nurse Practitioner

## 2022-08-01 ENCOUNTER — Ambulatory Visit (INDEPENDENT_AMBULATORY_CARE_PROVIDER_SITE_OTHER): Payer: Medicare Other | Admitting: Nurse Practitioner

## 2022-08-01 VITALS — BP 122/80 | HR 67 | Ht 68.5 in | Wt 123.0 lb

## 2022-08-01 DIAGNOSIS — Z1211 Encounter for screening for malignant neoplasm of colon: Secondary | ICD-10-CM

## 2022-08-01 MED ORDER — NA SULFATE-K SULFATE-MG SULF 17.5-3.13-1.6 GM/177ML PO SOLN: 1.0000 | Freq: Once | ORAL | 0 refills | Status: AC

## 2022-08-01 NOTE — Patient Instructions (Addendum)
  You have been scheduled for a colonoscopy. Please follow written instructions given to you at your visit today.  Please pick up your prep supplies at the pharmacy within the next 1-3 days. If you use inhalers (even only as needed), please bring them with you on the day of your procedure.  _______________________________________________________  If your blood pressure at your visit was 140/90 or greater, please contact your primary care physician to follow up on this.  _______________________________________________________  If you are age 76 or older, your body mass index should be between 23-30. Your Body mass index is 18.43 kg/m. If this is out of the aforementioned range listed, please consider follow up with your Primary Care Provider.  If you are age 79 or younger, your body mass index should be between 19-25. Your Body mass index is 18.43 kg/m. If this is out of the aformentioned range listed, please consider follow up with your Primary Care Provider.   ________________________________________________________  The [facility] providers would like to encourage you to use [facility] to communicate with providers for non-urgent requests or questions.  Due to long hold times on the telephone, sending your provider a message by [facility] may be a faster and more efficient way to get a response.  Please allow 48 business hours for a response.  Please remember that this is for non-urgent requests.  _______________________________________________________ It was a pleasure to see you today!  Thank you for trusting me with your gastrointestinal care!

## 2022-08-01 NOTE — Progress Notes (Signed)
Appears this note was inadvertently sent to me. Forwarding to Dr. Rhea Belton.

## 2022-08-01 NOTE — Progress Notes (Signed)
Primary GI: Erick Blinks, MD  Assessment / Plan   77 y.o. yo female with a past medical history consisting of, but not necessarily limited to multiple sclerosis . Patient is referred by PCP for colon cancer screening  Colon cancer screening.  Patient is due for 10 year screening colonoscopy.  She has no alarms symptoms. Hgb 13.2 She feels willing and able to proceed with colonoscopy unless Dr. Rhea Belton has some concerns.  -She had a difficult colonoscopy 10 year ago. Sigmoid colon was redundant. The prep was poor despite completion of bowel prep. Despite manual pressure the colonoscope could not be advanced past the mid to distal transverse colon due to poor preparation, dilated lumen and significant redundancy. Follow up barium enema was done and normal.  -Patient looks and feels okay. She takes very little medication. Pending my discussion with Dr. Rhea Belton I will schedule the patient for her screening colonoscopy at the [facility] with a 2-day prep. The risks and benefits of colonoscopy with possible polypectomy / biopsies were discussed with the patient and her daughter. The patient agrees to proceed.   -She understands that depending on what Dr. Rhea Belton thinks, the colonoscopy may be canceled.   History of Present Illness   Chief Complaint:  Here to discuss recall colonoscopy   Patient was last seen by [place] in 2014.  At that time she underwent a colonoscopy to evaluate abdominal pain, and abnormal CT scan raising concern for partial sigmoid volvulus.  The exam was not able to be completed.  Scope could traverse the transverse colon due to poor preparation, dilated lumen and significant redundancy.  Follow-up barium enema was unremarkable.   Interval History:  Patient's last colonoscopy was 10 years ago. She was on the 10 year recall list to get scheduled for colonoscopy but Dr. Rhea Belton preferred her to come into the office to discuss.   Patient is accompanied by her Daughter. Ms Heinsohn looks good ,   seems to feel okay. She has been diagnosed with multiple sclerosis ( I think since we last saw her) but she isn't taking any medication for it.  She has not had any bowel changes or blood in her stool.  Her appetite is good, no significant changes in her weight.  She is comfortable proceeding with screening colonoscopy unless Dr. Rhea Belton has some concerns.    Previous Endoscopies / Labs /  Imaging  Colonoscopy March 2014 for bloating and change in bowel habits and abdominal pain, abnormal CT scan with concern for possible incomplete sigmoid volvulus.  -The lumen was significantly dilated in the left colon (there was air in the rectum and distal sigmoid).  The preparation was poor despite completion of the oral bowel prep.  The colon was redundant in the sigmoid.  There was no evidence of left colon mass or true volvulus seen in the left colon.  Despite manual pressure the colonoscope could not be advanced past the mid to distal transverse colon due to poor preparation, dilated lumen and significant redundancy.  Barium enema March 2014 -Marked redundancy of the colon, particularly the sigmoid portion.  However, there is no cecal volvulus or other significant  abnormality.       Latest Ref Rng & Units 01/31/2022   10:15 AM 04/22/2012    6:43 AM 04/15/2012    1:45 PM  CBC  WBC 3.8 - 10.8 Thousand/uL 7.0  7.4  8.0   Hemoglobin 11.7 - 15.5 g/dL 16.1  09.6  04.5   Hematocrit 35.0 -  45.0 % 39.4  34.3  32.9   Platelets 140 - 400 Thousand/uL 220  200  181     No results found for: "LIPASE"    Latest Ref Rng & Units 01/31/2022   10:15 AM 04/22/2012    6:43 AM 04/15/2012    1:45 PM  CMP  Glucose 65 - 139 mg/dL 87  88  83   BUN 7 - 25 mg/dL 12  10  16    Creatinine 0.60 - 1.00 mg/dL 9.60  4.54  0.98   Sodium 135 - 146 mmol/L 143  145  142   Potassium 3.5 - 5.3 mmol/L 4.0  3.9  4.4   Chloride 98 - 110 mmol/L 108  106  108   CO2 20 - 32 mmol/L 27  32  26   Calcium 8.6 - 10.4 mg/dL 9.9  9.0  9.3    Total Protein 6.1 - 8.1 g/dL 7.5  5.8  6.2   Total Bilirubin 0.2 - 1.2 mg/dL 1.1  0.7  0.4   Alkaline Phos 39 - 117 U/L  39  46   AST 10 - 35 U/L 19  15  27    ALT 6 - 29 U/L 11  24  25       Past Medical History:  Diagnosis Date   History of blood transfusion    Hypertension    MS (multiple sclerosis) (HCC)    Senile dementia (HCC)    Past Surgical History:  Procedure Laterality Date   COLONOSCOPY N/A 04/20/2012   Procedure: COLONOSCOPY;  Surgeon: Beverley Fiedler, MD;  Location: [facility] ENDOSCOPY;  Service: Gastroenterology;  Laterality: N/A;   CYSTECTOMY     in HS   TONSILLECTOMY     Family History  Problem Relation Age of Onset   Hypertension Mother    Breast cancer Mother 68   Hypertension Father    Stroke Father    Hypertension Sister    Stroke Sister    Breast cancer Sister    Hypertension Brother    Cancer - Other Maternal Grandmother        stomach   Multiple sclerosis Cousin    Social History   Tobacco Use   Smoking status: Former    Packs/day: 0.25    Years: 15.00    Additional pack years: 0.00    Total pack years: 3.75    Types: Cigarettes    Quit date: 02/04/1984    Years since quitting: 38.5   Smokeless tobacco: Never  Vaping Use   Vaping Use: Never used  Substance Use Topics   Alcohol use: Yes    Comment: Wine Occassionally   Drug use: Yes    Types: Marijuana    Comment: Gummies off and on in [place]   Current Outpatient Medications  Medication Sig Dispense Refill   acetaminophen (TYLENOL) 325 MG tablet Take 650 mg by mouth every 6 (six) hours as needed.     amLODipine (NORVASC) 5 MG tablet Take 1 tablet (5 mg total) by mouth daily. 90 tablet 1   Biotin 11914 MCG TABS Take 1 tablet by mouth daily.     fish oil-omega-3 fatty acids 1000 MG capsule Take 2 g by mouth daily.     magnesium gluconate (MAGONATE) 500 MG tablet Take 500 mg by mouth as needed.     melatonin 5 MG TABS Take 1-2 tablets by mouth at bedtime.     UNABLE TO FIND Med Name:Cell Food  8 drops into water three times  daily     Vitamin D-Vitamin K (VITAMIN K2-VITAMIN D3 PO) Take 1 tablet by mouth daily.     No current facility-administered medications for this visit.   No Known Allergies   Review of Systems: Positive for skin rash, urine leakage.  All other systems reviewed and negative except where noted in HPI.   Wt Readings from Last 3 Encounters:  03/14/22 125 lb 12.8 oz (57.1 kg)  01/31/22 126 lb (57.2 kg)  11/04/21 124 lb (56.2 kg)    Physical Exam:  BP 122/80   Pulse 67   Ht 5' 8.5" (1.74 m)   Wt 123 lb (55.8 kg)   SpO2 97%   BMI 18.43 kg/m  Constitutional:  Pleasant, generally well appearing female in no acute distress. Psychiatric:  Normal mood and affect. Behavior is normal. EENT: Pupils normal.  Conjunctivae are normal. No scleral icterus. Neck supple.  Cardiovascular: Normal rate, regular rhythm.  Pulmonary/chest: Effort normal and breath sounds normal. No wheezing, rales or rhonchi. Abdominal: Soft, nondistended, nontender. Bowel sounds active throughout. There are no masses palpable.  Neurological: Alert and oriented to person place and time.  Willette Cluster, NP  08/01/2022, 7:55 AM

## 2022-08-02 NOTE — Progress Notes (Signed)
Addendum: Reviewed and agree with assessment and management plan. Okay to attempt repeat screening colon.  She has a known redundant left colon, but with 2 day bowel prep and water immersion we may be able to successfully complete the procedure.  If not virtual colonoscopy CT would be recommended. Sudais Banghart, Carie Caddy, MD

## 2022-08-18 ENCOUNTER — Ambulatory Visit
Admission: RE | Admit: 2022-08-18 | Discharge: 2022-08-18 | Disposition: A | Discharge status: Home / Self Care | Payer: Medicare Other | Source: Ambulatory Visit | Attending: Nurse Practitioner | Admitting: Nurse Practitioner

## 2022-08-18 ENCOUNTER — Ambulatory Visit: Payer: Medicare Other

## 2022-08-18 DIAGNOSIS — Z1231 Encounter for screening mammogram for malignant neoplasm of breast: Secondary | ICD-10-CM

## 2022-10-09 ENCOUNTER — Encounter: Payer: Medicare Other | Admitting: Internal Medicine

## 2022-10-27 ENCOUNTER — Encounter: Payer: Self-pay | Admitting: Nurse Practitioner

## 2022-10-27 ENCOUNTER — Telehealth: Payer: Medicare Other | Admitting: Nurse Practitioner

## 2022-10-27 DIAGNOSIS — Z0000 Encounter for general adult medical examination without abnormal findings: Secondary | ICD-10-CM

## 2022-10-27 DIAGNOSIS — E2839 Other primary ovarian failure: Secondary | ICD-10-CM

## 2022-10-27 NOTE — Progress Notes (Signed)
   This service is provided via telemedicine  No vital signs collected/recorded due to the encounter was a telemedicine visit.   Location of patient (ex: home, work):  Home  Patient consents to a telephone visit: Yes  Location of the provider (ex: office, home):  [facility], Office   Name of any referring provider:  N/A  Names of all persons participating in the telemedicine service and their role in the encounter:  S.Chrae B/CMA, Abbey Chatters, NP, [name] (daughter) and Patient   Time spent on call:  11 min with medical assistant

## 2022-10-27 NOTE — Patient Instructions (Signed)
  Michelle Hodges , Thank you for taking time to come for your Medicare Wellness Visit. I appreciate your ongoing commitment to your health goals. Please review the following plan we discussed and let me know if I can assist you in the future.   These are the goals we discussed:  Goals      Patient Stated     To jog again         This is a list of the screening recommended for you and due dates:  Health Maintenance  Topic Date Due   Hepatitis C Screening  Never done   DTaP/Tdap/Td vaccine (1 - Tdap) Never done   Zoster (Shingles) Vaccine (1 of 2) Never done   Pneumonia Vaccine (2 of 2 - PPSV23 or PCV20) 01/11/2015   Flu Shot  09/04/2022   COVID-19 Vaccine (2 - 2023-24 season) 10/05/2022   Medicare Annual Wellness Visit  10/27/2023   DEXA scan (bone density measurement)  Completed   HPV Vaccine  Aged Out   Colon Cancer Screening  Discontinued   COVID booster, TDAP and shingles series to get at local pharmacy  We can give flu shot and pneumonia vaccine at office.

## 2022-10-27 NOTE — Progress Notes (Signed)
Subjective:   Michelle Hodges is a 77 y.o. female who presents for Medicare Annual (Subsequent) preventive examination.  Visit Complete: Virtual  I connected with  Mikel Kaelin Harold on 10/27/22 by a video and audio enabled telemedicine application and verified that I am speaking with the correct person using two identifiers.  Patient Location: Home  Provider Location: Office/Clinic  I discussed the limitations of evaluation and management by telemedicine. The patient expressed understanding and agreed to proceed.   Cardiac Risk Factors include: advanced age (>77men, >82 women);hypertension     Objective:    There were no vitals filed for this visit. There is no height or weight on file to calculate BMI.     10/27/2022    1:45 PM 03/14/2022    8:57 AM 01/31/2022    9:28 AM 03/23/2015    8:17 AM 03/19/2015    9:17 AM 09/20/2014    9:16 AM 02/01/2014   12:24 PM  Advanced Directives  Does Patient Have a Medical Advance Directive? No No No No No No No  Would patient like information on creating a medical advance directive? Yes (MAU/Ambulatory/Procedural Areas - Information given)  Yes (MAU/Ambulatory/Procedural Areas - Information given) No - patient declined information Yes - Educational materials given Yes - Educational materials given Yes - Educational materials given    Current Medications (verified) Outpatient Encounter Medications as of 10/27/2022  Medication Sig   acetaminophen (TYLENOL) 325 MG tablet Take 650 mg by mouth every 6 (six) hours as needed.   amLODipine (NORVASC) 5 MG tablet Take 1 tablet (5 mg total) by mouth daily.   Biotin 40981 MCG TABS Take 1 tablet by mouth daily.   fish oil-omega-3 fatty acids 1000 MG capsule Take 2 g by mouth daily.   Homeopathic Products (OSCILLOCOCCINUM PO) Take 1 spray by mouth daily.   MAGNESIUM BISGLYCINATE PO Take 200 mg by mouth at bedtime.   melatonin 5 MG TABS Take 1-2 tablets by mouth at bedtime.   Vitamin D-Vitamin K (VITAMIN  K2-VITAMIN D3 PO) Take 1 tablet by mouth daily.   [DISCONTINUED] magnesium gluconate (MAGONATE) 500 MG tablet Take 500 mg by mouth as needed.   No facility-administered encounter medications on file as of 10/27/2022.    Allergies (verified) Patient has no known allergies.   History: Past Medical History:  Diagnosis Date   History of blood transfusion    Hypertension    MS (multiple sclerosis) (HCC)    Senile dementia (HCC)    Past Surgical History:  Procedure Laterality Date   COLONOSCOPY N/A 04/20/2012   Procedure: COLONOSCOPY;  Surgeon: Beverley Fiedler, MD;  Location: [facility] ENDOSCOPY;  Service: Gastroenterology;  Laterality: N/A;   CYSTECTOMY     in HS   TONSILLECTOMY     Family History  Problem Relation Age of Onset   Hypertension Mother    Breast cancer Mother 55   Hypertension Father    Stroke Father    Hypertension Sister    Stroke Sister    Breast cancer Sister    Hypertension Brother    Cancer - Other Maternal Grandmother        stomach   Multiple sclerosis Cousin    Colon cancer Neg Hx    Rectal cancer Neg Hx    Social History   Socioeconomic History   Marital status: Divorced    Spouse name: Not on file   Number of children: 2   Years of education: B.S.   Highest education level: Not  on file  Occupational History    Employer: UNEMPLOYED    Comment: [employer], part-time  Tobacco Use   Smoking status: Former    Current packs/day: 0.00    Average packs/day: 0.3 packs/day for 15.0 years (3.8 ttl pk-yrs)    Types: Cigarettes    Start date: 02/03/1969    Quit date: 02/04/1984    Years since quitting: 38.7   Smokeless tobacco: Never  Vaping Use   Vaping status: Never Used  Substance and Sexual Activity   Alcohol use: Yes    Comment: Wine Occassionally   Drug use: Yes    Types: Marijuana    Comment: Gummies off and on in [place]   Sexual activity: Not on file  Other Topics Concern   Not on file  Social History Narrative   11/04/21 Pt lives at home with her  daughter, [name].   Caffeine Use- She does drink coffee.   Social Determinants of Health   Financial Resource Strain: Not on file  Food Insecurity: Not on file  Transportation Needs: Not on file  Physical Activity: Not on file  Stress: Not on file  Social Connections: Not on file    Tobacco Counseling Counseling given: Not Answered   Clinical Intake:     Pain : No/denies pain     BMI - recorded: 18 Nutritional Status: BMI <19  Underweight Nutritional Risks: None Diabetes: No  How often do you need to have someone help you when you read instructions, pamphlets, or other written materials from your doctor or pharmacy?: 4 - Often         Activities of Daily Living    10/27/2022    2:13 PM  In your present state of health, do you have any difficulty performing the following activities:  Hearing? 0  Vision? 0  Difficulty concentrating or making decisions? 1  Walking or climbing stairs? 0  Comment has had several falls  Dressing or bathing? 0  Doing errands, shopping? 1  Comment daughter helps with Wellsite geologist and eating ? N  Using the Toilet? N  In the past six months, have you accidently leaked urine? Y  Do you have problems with loss of bowel control? N  Managing your Medications? N  Managing your Finances? Y  Comment daughter helps  Housekeeping or managing your Housekeeping? Y    Patient Care Team: Sharon Seller, NP as PCP - General (Geriatric Medicine)  Indicate any recent Medical Services you may have received from other than [facility] providers in the past year (date may be approximate).     Assessment:   This is a routine wellness examination for Michelle Hodges.  Hearing/Vision screen Hearing Screening - Comments:: Patient with hearing issues  Vision Screening - Comments:: Last eye exam greater than 12 months ago. Dr,Jinch[place], [place]    Goals Addressed             This Visit's Progress    Patient Stated       To jog  again        Depression Screen    10/27/2022    1:43 PM 03/14/2022    8:56 AM 01/31/2022    9:27 AM 03/19/2015    9:29 AM 09/20/2014    9:41 AM  PHQ 2/9 Scores  PHQ - 2 Score 1 0 0 0 0  PHQ- 9 Score    2 1    Fall Risk    10/27/2022    1:42 PM 03/14/2022  8:56 AM 01/31/2022    9:26 AM 03/19/2015    9:29 AM 09/20/2014    9:41 AM  Fall Risk   Falls in the past year? 1 0 1 Yes No  Number falls in past yr: 1 0 1 2 or more   Comment   3 within the last year per daughter    Injury with Fall? 0 0 1 No   Comment   Cut head    Risk Factor Category     High Fall Risk   Risk for fall due to : History of fall(s) Impaired mobility History of fall(s) History of fall(s);Impaired mobility;Impaired balance/gait;Medication side effect Impaired mobility  Risk for fall due to: Comment     educated and given handout on fall prevention in the home  Follow up Falls evaluation completed Falls evaluation completed Falls evaluation completed Education provided;Falls prevention discussed   Comment    previously     MEDICARE RISK AT HOME:    TIMED UP AND GO:  Was the test performed?  No    Cognitive Function:    03/14/2022    9:45 AM  MMSE - Mini Mental State Exam  Orientation to time 5  Orientation to Place 3  Registration 3  Attention/ Calculation 3  Recall 2  Language- name 2 objects 2  Language- repeat 0  Language- follow 3 step command 3  Language- read & follow direction 1  Write a sentence 1  Copy design 0  Total score 23        10/27/2022    1:46 PM  6CIT Screen  What Year? 4 points  What month? 0 points  What time? 0 points  Count back from 20 0 points  Months in reverse 0 points  Repeat phrase 10 points  Total Score 14 points    Immunizations Immunization History  Administered Date(s) Administered   Fluad Quad(high Dose 65+) 03/14/2022   Moderna Sars-Covid-2 Vaccination 11/15/2021   Pneumococcal Conjugate-13 01/10/2014    TDAP status: Due, Education has been  provided regarding the importance of this vaccine. Advised may receive this vaccine at local pharmacy or Health Dept. Aware to provide a copy of the vaccination record if obtained from local pharmacy or Health Dept. Verbalized acceptance and understanding.  Flu Vaccine status: Due, Education has been provided regarding the importance of this vaccine. Advised may receive this vaccine at local pharmacy or Health Dept. Aware to provide a copy of the vaccination record if obtained from local pharmacy or Health Dept. Verbalized acceptance and understanding.  Pneumococcal vaccine status: Due, Education has been provided regarding the importance of this vaccine. Advised may receive this vaccine at local pharmacy or Health Dept. Aware to provide a copy of the vaccination record if obtained from local pharmacy or Health Dept. Verbalized acceptance and understanding.  Covid-19 vaccine status: Information provided on how to obtain vaccines.   Qualifies for Shingles Vaccine? Yes   Zostavax completed No   Shingrix Completed?: No.    Education has been provided regarding the importance of this vaccine. Patient has been advised to call insurance company to determine out of pocket expense if they have not yet received this vaccine. Advised may also receive vaccine at local pharmacy or Health Dept. Verbalized acceptance and understanding.  Screening Tests Health Maintenance  Topic Date Due   Hepatitis C Screening  Never done   DTaP/Tdap/Td (1 - Tdap) Never done   Zoster Vaccines- Shingrix (1 of 2) Never done   Pneumonia Vaccine  15+ Years old (2 of 2 - PPSV23 or PCV20) 01/11/2015   INFLUENZA VACCINE  09/04/2022   COVID-19 Vaccine (2 - 2023-24 season) 10/05/2022   Medicare Annual Wellness (AWV)  10/27/2023   DEXA SCAN  Completed   HPV VACCINES  Aged Out   Colonoscopy  Discontinued    Health Maintenance  Health Maintenance Due  Topic Date Due   Hepatitis C Screening  Never done   DTaP/Tdap/Td (1 - Tdap)  Never done   Zoster Vaccines- Shingrix (1 of 2) Never done   Pneumonia Vaccine 65+ Years old (2 of 2 - PPSV23 or PCV20) 01/11/2015   INFLUENZA VACCINE  09/04/2022   COVID-19 Vaccine (2 - 2023-24 season) 10/05/2022    Colorectal cancer screening: No longer required.   Mammogram status: No longer required due to age.  Bone Density status: Ordered today. Pt provided with contact info and advised to call to schedule appt.  Lung Cancer Screening: (Low Dose CT Chest recommended if Age 26-80 years, 20 pack-year currently smoking OR have quit w/in 15years.) does not qualify.   Lung Cancer Screening Referral: na  Additional Screening:  Hepatitis C Screening: does qualify; Complete at next blood draw  Vision Screening: Recommended annual ophthalmology exams for early detection of glaucoma and other disorders of the eye. Is the patient up to date with their annual eye exam?  No  Who is the provider or what is the name of the office in which the patient attends annual eye exams? . Dr. Ave Filter If pt is not established with a provider, would they like to be referred to a provider to establish care? No .   Dental Screening: Recommended annual dental exams for proper oral hygiene   Community Resource Referral / Chronic Care Management: CRR required this visit?  No   CCM required this visit?  No     Plan:     I have personally reviewed and noted the following in the patient's chart:   Medical and social history Use of alcohol, tobacco or illicit drugs  Current medications and supplements including opioid prescriptions. Patient is not currently taking opioid prescriptions. Functional ability and status Nutritional status Physical activity Advanced directives List of other physicians Hospitalizations, surgeries, and ER visits in previous 12 months Vitals Screenings to include cognitive, depression, and falls Referrals and appointments  In addition, I have reviewed and discussed with  patient certain preventive protocols, quality metrics, and best practice recommendations. A written personalized care plan for preventive services as well as general preventive health recommendations were provided to patient.     Sharon Seller, NP   10/27/2022   After Visit Summary: (MyChart) Due to this being a telephonic visit, the after visit summary with patients personalized plan was offered to patient via MyChart

## 2022-11-03 ENCOUNTER — Ambulatory Visit: Payer: Medicare Other | Admitting: Nurse Practitioner

## 2022-12-12 ENCOUNTER — Ambulatory Visit: Payer: Medicare Other | Admitting: Nurse Practitioner

## 2022-12-29 ENCOUNTER — Encounter: Payer: Self-pay | Admitting: Nurse Practitioner

## 2022-12-29 ENCOUNTER — Ambulatory Visit (INDEPENDENT_AMBULATORY_CARE_PROVIDER_SITE_OTHER): Payer: Medicare Other | Admitting: Nurse Practitioner

## 2022-12-29 VITALS — BP 160/90 | HR 77 | Temp 98.0°F | Ht 69.0 in | Wt 120.0 lb

## 2022-12-29 DIAGNOSIS — Z1159 Encounter for screening for other viral diseases: Secondary | ICD-10-CM

## 2022-12-29 DIAGNOSIS — F03A0 Unspecified dementia, mild, without behavioral disturbance, psychotic disturbance, mood disturbance, and anxiety: Secondary | ICD-10-CM

## 2022-12-29 DIAGNOSIS — Z23 Encounter for immunization: Secondary | ICD-10-CM

## 2022-12-29 DIAGNOSIS — G4700 Insomnia, unspecified: Secondary | ICD-10-CM

## 2022-12-29 DIAGNOSIS — I10 Essential (primary) hypertension: Secondary | ICD-10-CM | POA: Diagnosis not present

## 2022-12-29 DIAGNOSIS — G35 Multiple sclerosis: Secondary | ICD-10-CM | POA: Diagnosis not present

## 2022-12-29 DIAGNOSIS — E7800 Pure hypercholesterolemia, unspecified: Secondary | ICD-10-CM | POA: Diagnosis not present

## 2022-12-29 MED ORDER — AMLODIPINE BESYLATE 5 MG PO TABS
5.0000 mg | ORAL_TABLET | Freq: Every day | ORAL | 1 refills | Status: DC
Start: 2022-12-29 — End: 2023-06-16

## 2022-12-29 NOTE — Progress Notes (Signed)
Careteam: Patient Care Team: Sharon Seller, NP as PCP - General (Geriatric Medicine)  PLACE OF SERVICE:  [facility] CLINIC  Advanced Directive information    No Known Allergies  No chief complaint on file.    HPI: Patient is a 77 y.o. female routine follow up   PT had recommended brace  She is dragging her foot and has balance issues. Felt like AFO brace will help with mobility and decrease risk of fall.  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   Daughter with her at visit - also said PT recommended back brace- no form for this has been sent.   Htn- on norvasc, blood pressures normal at home.   Pt reports overall she is doing well   Review of Systems:  Review of Systems  Constitutional:  Negative for chills, fever and weight loss.  HENT:  Negative for tinnitus.   Respiratory:  Negative for cough, sputum production and shortness of breath.   Cardiovascular:  Negative for chest pain, palpitations and leg swelling.  Gastrointestinal:  Negative for abdominal pain, constipation, diarrhea and heartburn.  Genitourinary:  Negative for dysuria, frequency and urgency.  Musculoskeletal:  Positive for back pain. Negative for falls, joint pain and myalgias.  Skin: Negative.   Neurological:  Negative for dizziness and headaches.  Psychiatric/Behavioral:  Positive for memory loss. Negative for depression.     Past Medical History:  Diagnosis Date   History of blood transfusion    Hypertension    MS (multiple sclerosis) (HCC)    Senile dementia (HCC)    Past Surgical History:  Procedure Laterality Date   COLONOSCOPY N/A 04/20/2012   Procedure: COLONOSCOPY;  Surgeon: Beverley Fiedler, MD;  Location: [facility] ENDOSCOPY;  Service: Gastroenterology;  Laterality: N/A;   CYSTECTOMY     in HS   TONSILLECTOMY     Social History:   reports that she quit smoking about 38 years ago. Her smoking use included cigarettes. She started smoking about 53 years ago. She has a 3.8  pack-year smoking history. She has never used smokeless tobacco. She reports current alcohol use. She reports current drug use. Drug: Marijuana.  Family History  Problem Relation Age of Onset   Hypertension Mother    Breast cancer Mother 38   Hypertension Father    Stroke Father    Hypertension Sister    Stroke Sister    Breast cancer Sister    Hypertension Brother    Cancer - Other Maternal Grandmother        stomach   Multiple sclerosis Cousin    Colon cancer Neg Hx    Rectal cancer Neg Hx     Medications: Patient's Medications  New Prescriptions   No medications on file  Previous Medications   ACETAMINOPHEN (TYLENOL) 325 MG TABLET    Take 650 mg by mouth every 6 (six) hours as needed.   AMLODIPINE (NORVASC) 5 MG TABLET    Take 1 tablet (5 mg total) by mouth daily.   BIOTIN 16109 MCG TABS    Take 1 tablet by mouth daily.   FISH OIL-OMEGA-3 FATTY ACIDS 1000 MG CAPSULE    Take 2 g by mouth daily.   HOMEOPATHIC PRODUCTS (OSCILLOCOCCINUM PO)    Take 1 spray by mouth daily.   MAGNESIUM BISGLYCINATE PO    Take 200 mg by mouth at bedtime.   MELATONIN 5 MG TABS    Take 1-2 tablets by mouth at bedtime.   VITAMIN D-VITAMIN  K (VITAMIN K2-VITAMIN D3 PO)    Take 1 tablet by mouth daily.  Modified Medications   No medications on file  Discontinued Medications   No medications on file    Physical Exam:  Vitals:   12/29/22 1515  BP: (!) 160/100  Pulse: 77  Temp: 98 F (36.7 C)  SpO2: 97%  Weight: 120 lb (54.4 kg)  Height: 5\' 9"  (1.753 m)   Body mass index is 17.72 kg/m. Wt Readings from Last 3 Encounters:  12/29/22 120 lb (54.4 kg)  08/01/22 123 lb (55.8 kg)  03/14/22 125 lb 12.8 oz (57.1 kg)    Physical Exam Constitutional:      General: She is not in acute distress.    Appearance: She is well-developed. She is not diaphoretic.  HENT:     Head: Normocephalic and atraumatic.     Mouth/Throat:     Pharynx: No oropharyngeal exudate.  Eyes:     Conjunctiva/sclera:  Conjunctivae normal.     Pupils: Pupils are equal, round, and reactive to light.  Cardiovascular:     Rate and Rhythm: Normal rate and regular rhythm.     Heart sounds: Normal heart sounds.  Pulmonary:     Effort: Pulmonary effort is normal.     Breath sounds: Normal breath sounds.  Abdominal:     General: Bowel sounds are normal.     Palpations: Abdomen is soft.  Musculoskeletal:     Cervical back: Normal range of motion and neck supple.     Right lower leg: No edema.     Left lower leg: No edema.  Skin:    General: Skin is warm and dry.  Neurological:     Mental Status: She is alert.  Psychiatric:        Mood and Affect: Mood normal.     Labs reviewed: Basic Metabolic Panel: Recent Labs    01/31/22 1015  NA 143  K 4.0  CL 108  CO2 27  GLUCOSE 87  BUN 12  CREATININE 0.81  CALCIUM 9.9   Liver Function Tests: Recent Labs    01/31/22 1015  AST 19  ALT 11  BILITOT 1.1  PROT 7.5   No results for input(s): "LIPASE", "AMYLASE" in the last 8760 hours. No results for input(s): "AMMONIA" in the last 8760 hours. CBC: Recent Labs    01/31/22 1015  WBC 7.0  NEUTROABS 4,312  HGB 13.2  HCT 39.4  MCV 91.8  PLT 220   Lipid Panel: Recent Labs    01/31/22 1015  CHOL 198  HDL 75  LDLCALC 109*  TRIG 55  CHOLHDL 2.6   TSH: No results for input(s): "TSH" in the last 8760 hours. A1C: Lab Results  Component Value Date   HGBA1C 5.6 04/16/2012     Assessment/Plan 1. Multiple sclerosis (HCC) -stable at this time, supportive/palliative care recommended by neurologist at   2. Primary hypertension --Blood pressure elevated today but typically well controlled -home blood pressures are well controlled -No changes to medications today  -will have pt continue to monitor home bp goal <140/90, to notify if readings remain high on 3 different days  -follow metabolic panel  - COMPLETE METABOLIC PANEL WITH GFR; Future - CBC with Differential/Platelet; Future -  amLODipine (NORVASC) 5 MG tablet; Take 1 tablet (5 mg total) by mouth daily.  Dispense: 90 tablet; Refill: 1  3. 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 HYPERCHOLESTEROLEMIA Continue dietary modificatons - Lipid panel; Future - COMPLETE METABOLIC PANEL WITH GFR; Future  5. Insomnia, unspecified type -stable, continue on melatonin  6. Need for hepatitis C screening test - Hep C Antibody; Future  7. Encounter for immunization - Flu Vaccine Trivalent High Dose (Fluad)   Return in about 6 months (around 06/28/2023) for routine follow up .  Janene Harvey. Biagio Borg [facility] [phone]

## 2022-12-29 NOTE — Patient Instructions (Addendum)
encouraged to liberalize diet- to make sure she is eating 3 meals a day  To have protein supplement in addition to smallest meal of the day.   To schedule lab work next week.

## 2022-12-30 ENCOUNTER — Other Ambulatory Visit: Payer: Medicare Other

## 2022-12-31 ENCOUNTER — Other Ambulatory Visit: Payer: Medicare Other

## 2023-01-02 LAB — CBC WITH DIFFERENTIAL/PLATELET
Absolute Lymphocytes: 2349 {cells}/uL (ref 850–3900)
Absolute Monocytes: 451 {cells}/uL (ref 200–950)
Basophils Absolute: 12 {cells}/uL (ref 0–200)
Basophils Relative: 0.2 %
Eosinophils Absolute: 128 {cells}/uL (ref 15–500)
Eosinophils Relative: 2.1 %
HCT: 40.7 % (ref 35.0–45.0)
Hemoglobin: 13.1 g/dL (ref 11.7–15.5)
MCH: 29.9 pg (ref 27.0–33.0)
MCHC: 32.2 g/dL (ref 32.0–36.0)
MCV: 92.9 fL (ref 80.0–100.0)
MPV: 11.9 fL (ref 7.5–12.5)
Monocytes Relative: 7.4 %
Neutro Abs: 3160 {cells}/uL (ref 1500–7800)
Neutrophils Relative %: 51.8 %
Platelets: 256 10*3/uL (ref 140–400)
RBC: 4.38 10*6/uL (ref 3.80–5.10)
RDW: 12.5 % (ref 11.0–15.0)
Total Lymphocyte: 38.5 %
WBC: 6.1 10*3/uL (ref 3.8–10.8)

## 2023-01-02 LAB — COMPLETE METABOLIC PANEL WITH GFR
AG Ratio: 1.3 (calc) (ref 1.0–2.5)
ALT: 16 U/L (ref 6–29)
AST: 23 U/L (ref 10–35)
Albumin: 4.1 g/dL (ref 3.6–5.1)
Alkaline phosphatase (APISO): 61 U/L (ref 37–153)
BUN: 12 mg/dL (ref 7–25)
CO2: 26 mmol/L (ref 20–32)
Calcium: 9.8 mg/dL (ref 8.6–10.4)
Chloride: 106 mmol/L (ref 98–110)
Creat: 0.86 mg/dL (ref 0.60–1.00)
Globulin: 3.1 g/dL (ref 1.9–3.7)
Glucose, Bld: 96 mg/dL (ref 65–99)
Potassium: 4.3 mmol/L (ref 3.5–5.3)
Sodium: 141 mmol/L (ref 135–146)
Total Bilirubin: 0.8 mg/dL (ref 0.2–1.2)
Total Protein: 7.2 g/dL (ref 6.1–8.1)
eGFR: 70 mL/min/{1.73_m2} (ref >=60)

## 2023-01-02 LAB — LIPID PANEL
Cholesterol: 149 mg/dL (ref <200)
HDL: 62 mg/dL (ref >=50)
LDL Cholesterol (Calc): 72 mg/dL
Non-HDL Cholesterol (Calc): 87 mg/dL (ref <130)
Total CHOL/HDL Ratio: 2.4 (calc) (ref <5.0)
Triglycerides: 72 mg/dL (ref <150)

## 2023-01-02 LAB — HEPATITIS C ANTIBODY: Hepatitis C Ab: NONREACTIVE

## 2023-04-29 ENCOUNTER — Telehealth: Payer: Self-pay

## 2023-04-29 NOTE — Telephone Encounter (Signed)
 Form printed and placed in provider review and sign folder

## 2023-04-29 NOTE — Telephone Encounter (Signed)
 Copied from CRM [id number]. Topic: Clinical - Medical Advice >> Apr 29, 2023  2:41 PM Dennison Nancy wrote: Reason for CRM: patient need her handicap placard renew , not sure if need a letter , it  expired in december 2024 please call patient [phone] with respond and patient want to know how would she recieved the renew placard  Please advise if you are willing to sign a placard for the patient and I will have it completed and ready to sign on Friday (when you return to office)

## 2023-04-29 NOTE — Telephone Encounter (Signed)
 Yes, unable to walk without assistance of devices.

## 2023-05-01 NOTE — Telephone Encounter (Signed)
 Called patient daughter [name] and she stated that she will come by the office and pick up paper. I let her know that I will place it up front with admin staff.

## 2023-05-19 ENCOUNTER — Other Ambulatory Visit: Payer: Medicare Other

## 2023-05-20 ENCOUNTER — Other Ambulatory Visit: Payer: Self-pay | Admitting: Nurse Practitioner

## 2023-05-20 DIAGNOSIS — E2839 Other primary ovarian failure: Secondary | ICD-10-CM

## 2023-06-16 ENCOUNTER — Other Ambulatory Visit: Payer: Self-pay | Admitting: Nurse Practitioner

## 2023-06-16 DIAGNOSIS — I10 Essential (primary) hypertension: Secondary | ICD-10-CM

## 2023-07-02 NOTE — Patient Instructions (Signed)
 1.) Please contact your local pharmacy regarding the shingles vaccine.

## 2023-07-03 ENCOUNTER — Encounter: Payer: Medicare Other | Admitting: Nurse Practitioner

## 2023-07-03 NOTE — Progress Notes (Signed)
 This encounter was created in error - please disregard.

## 2023-09-29 ENCOUNTER — Observation Stay (HOSPITAL_COMMUNITY)

## 2023-09-29 ENCOUNTER — Emergency Department (HOSPITAL_COMMUNITY)

## 2023-09-29 ENCOUNTER — Encounter (HOSPITAL_COMMUNITY): Payer: Self-pay | Admitting: Emergency Medicine

## 2023-09-29 ENCOUNTER — Observation Stay (HOSPITAL_BASED_OUTPATIENT_CLINIC_OR_DEPARTMENT_OTHER)

## 2023-09-29 ENCOUNTER — Observation Stay (HOSPITAL_COMMUNITY)
Admission: EM | Admit: 2023-09-29 | Discharge: 2023-09-30 | Disposition: A | Discharge status: Home / Self Care | Attending: Family Medicine | Admitting: Family Medicine

## 2023-09-29 ENCOUNTER — Other Ambulatory Visit: Payer: Self-pay

## 2023-09-29 DIAGNOSIS — G459 Transient cerebral ischemic attack, unspecified: Principal | ICD-10-CM | POA: Insufficient documentation

## 2023-09-29 DIAGNOSIS — Z7982 Long term (current) use of aspirin: Secondary | ICD-10-CM | POA: Insufficient documentation

## 2023-09-29 DIAGNOSIS — I10 Essential (primary) hypertension: Secondary | ICD-10-CM | POA: Diagnosis not present

## 2023-09-29 DIAGNOSIS — I639 Cerebral infarction, unspecified: Secondary | ICD-10-CM | POA: Diagnosis not present

## 2023-09-29 DIAGNOSIS — G35 Multiple sclerosis: Secondary | ICD-10-CM | POA: Insufficient documentation

## 2023-09-29 DIAGNOSIS — M6281 Muscle weakness (generalized): Secondary | ICD-10-CM | POA: Diagnosis not present

## 2023-09-29 DIAGNOSIS — R4182 Altered mental status, unspecified: Secondary | ICD-10-CM | POA: Insufficient documentation

## 2023-09-29 DIAGNOSIS — F1090 Alcohol use, unspecified, uncomplicated: Secondary | ICD-10-CM | POA: Insufficient documentation

## 2023-09-29 DIAGNOSIS — F1290 Cannabis use, unspecified, uncomplicated: Secondary | ICD-10-CM | POA: Insufficient documentation

## 2023-09-29 DIAGNOSIS — R29705 NIHSS score 5: Secondary | ICD-10-CM | POA: Diagnosis not present

## 2023-09-29 DIAGNOSIS — R2689 Other abnormalities of gait and mobility: Secondary | ICD-10-CM | POA: Insufficient documentation

## 2023-09-29 DIAGNOSIS — D72829 Elevated white blood cell count, unspecified: Secondary | ICD-10-CM | POA: Insufficient documentation

## 2023-09-29 DIAGNOSIS — R569 Unspecified convulsions: Secondary | ICD-10-CM | POA: Diagnosis not present

## 2023-09-29 DIAGNOSIS — Z9181 History of falling: Secondary | ICD-10-CM | POA: Insufficient documentation

## 2023-09-29 DIAGNOSIS — R410 Disorientation, unspecified: Secondary | ICD-10-CM | POA: Diagnosis present

## 2023-09-29 DIAGNOSIS — Z87891 Personal history of nicotine dependence: Secondary | ICD-10-CM | POA: Diagnosis not present

## 2023-09-29 LAB — COMPREHENSIVE METABOLIC PANEL WITH GFR
ALT: 19 U/L (ref 0–44)
AST: 35 U/L (ref 15–41)
Albumin: 3.5 g/dL (ref 3.5–5.0)
Alkaline Phosphatase: 53 U/L (ref 38–126)
Anion gap: 13 (ref 5–15)
BUN: 20 mg/dL (ref 8–23)
CO2: 22 mmol/L (ref 22–32)
Calcium: 9.7 mg/dL (ref 8.9–10.3)
Chloride: 102 mmol/L (ref 98–111)
Creatinine, Ser: 0.98 mg/dL (ref 0.44–1.00)
GFR, Estimated: 59 mL/min — ABNORMAL LOW (ref >60)
Glucose, Bld: 146 mg/dL — ABNORMAL HIGH (ref 70–99)
Potassium: 3.6 mmol/L (ref 3.5–5.1)
Sodium: 137 mmol/L (ref 135–145)
Total Bilirubin: 1.3 mg/dL — ABNORMAL HIGH (ref 0.0–1.2)
Total Protein: 6.9 g/dL (ref 6.5–8.1)

## 2023-09-29 LAB — I-STAT CHEM 8, ED
BUN: 21 mg/dL (ref 8–23)
Calcium, Ion: 1.15 mmol/L (ref 1.15–1.40)
Chloride: 106 mmol/L (ref 98–111)
Creatinine, Ser: 1 mg/dL (ref 0.44–1.00)
Glucose, Bld: 146 mg/dL — ABNORMAL HIGH (ref 70–99)
HCT: 39 % (ref 36.0–46.0)
Hemoglobin: 13.3 g/dL (ref 12.0–15.0)
Potassium: 3.6 mmol/L (ref 3.5–5.1)
Sodium: 139 mmol/L (ref 135–145)
TCO2: 21 mmol/L — ABNORMAL LOW (ref 22–32)

## 2023-09-29 LAB — DIFFERENTIAL
Abs Immature Granulocytes: 0.14 K/uL — ABNORMAL HIGH (ref 0.00–0.07)
Basophils Absolute: 0 K/uL (ref 0.0–0.1)
Basophils Relative: 0 %
Eosinophils Absolute: 0.1 K/uL (ref 0.0–0.5)
Eosinophils Relative: 1 %
Immature Granulocytes: 1 %
Lymphocytes Relative: 5 %
Lymphs Abs: 0.6 K/uL — ABNORMAL LOW (ref 0.7–4.0)
Monocytes Absolute: 0.2 K/uL (ref 0.1–1.0)
Monocytes Relative: 1 %
Neutro Abs: 11.1 K/uL — ABNORMAL HIGH (ref 1.7–7.7)
Neutrophils Relative %: 92 %

## 2023-09-29 LAB — CBC
HCT: 36.9 % (ref 36.0–46.0)
Hemoglobin: 12.1 g/dL (ref 12.0–15.0)
MCH: 30.2 pg (ref 26.0–34.0)
MCHC: 32.8 g/dL (ref 30.0–36.0)
MCV: 92 fL (ref 80.0–100.0)
Platelets: 202 K/uL (ref 150–400)
RBC: 4.01 MIL/uL (ref 3.87–5.11)
RDW: 14.6 % (ref 11.5–15.5)
WBC: 12.1 K/uL — ABNORMAL HIGH (ref 4.0–10.5)
nRBC: 0 % (ref 0.0–0.2)

## 2023-09-29 LAB — PROTIME-INR
INR: 1 (ref 0.8–1.2)
Prothrombin Time: 13.7 s (ref 11.4–15.2)

## 2023-09-29 LAB — ECHOCARDIOGRAM COMPLETE
Area-P 1/2: 2.62 cm2
S' Lateral: 2 cm
Weight: 2024.7 [oz_av]

## 2023-09-29 LAB — APTT: aPTT: <22 s — ABNORMAL LOW (ref 24–36)

## 2023-09-29 LAB — CBG MONITORING, ED: Glucose-Capillary: 164 mg/dL — ABNORMAL HIGH (ref 70–99)

## 2023-09-29 LAB — ETHANOL: Alcohol, Ethyl (B): <15 mg/dL (ref <15)

## 2023-09-29 MED ORDER — LORAZEPAM 1 MG PO TABS
1.0000 mg | ORAL_TABLET | ORAL | Status: AC
  Administered 2023-09-29: 1 mg via ORAL
  Filled 2023-09-29: qty 1

## 2023-09-29 MED ORDER — MAGNESIUM OXIDE -MG SUPPLEMENT 400 (240 MG) MG PO TABS
400.0000 mg | ORAL_TABLET | Freq: Every day | ORAL | Status: DC
  Administered 2023-09-29: 400 mg via ORAL
  Filled 2023-09-29: qty 1

## 2023-09-29 MED ORDER — GADOBUTROL 1 MMOL/ML IV SOLN
6.0000 mL | Freq: Once | INTRAVENOUS | Status: AC | PRN
  Administered 2023-09-29: 6 mL via INTRAVENOUS

## 2023-09-29 MED ORDER — ENOXAPARIN SODIUM 30 MG/0.3ML IJ SOSY
30.0000 mg | PREFILLED_SYRINGE | INTRAMUSCULAR | Status: DC
  Administered 2023-09-30: 30 mg via SUBCUTANEOUS
  Filled 2023-09-29: qty 0.3

## 2023-09-29 MED ORDER — ASPIRIN 81 MG PO CHEW
324.0000 mg | CHEWABLE_TABLET | Freq: Once | ORAL | Status: AC
  Administered 2023-09-29: 324 mg via ORAL
  Filled 2023-09-29: qty 4

## 2023-09-29 MED ORDER — ACETAMINOPHEN 160 MG/5ML PO SOLN
650.0000 mg | ORAL | Status: DC | PRN
Start: 2023-09-29 — End: 2023-09-30

## 2023-09-29 MED ORDER — ASPIRIN 81 MG PO TBEC
81.0000 mg | DELAYED_RELEASE_TABLET | Freq: Every day | ORAL | Status: DC
  Administered 2023-09-30: 81 mg via ORAL
  Filled 2023-09-29: qty 1

## 2023-09-29 MED ORDER — CLOPIDOGREL BISULFATE 300 MG PO TABS
300.0000 mg | ORAL_TABLET | Freq: Once | ORAL | Status: AC
  Administered 2023-09-29: 300 mg via ORAL
  Filled 2023-09-29: qty 1

## 2023-09-29 MED ORDER — MELATONIN 5 MG PO TABS
5.0000 mg | ORAL_TABLET | Freq: Every day | ORAL | Status: DC
  Administered 2023-09-29: 10 mg via ORAL
  Filled 2023-09-29: qty 2

## 2023-09-29 MED ORDER — SENNOSIDES-DOCUSATE SODIUM 8.6-50 MG PO TABS: 1.0000 | ORAL_TABLET | Freq: Every evening | ORAL | Status: DC | PRN

## 2023-09-29 MED ORDER — IOHEXOL 350 MG/ML SOLN
75.0000 mL | Freq: Once | INTRAVENOUS | Status: AC | PRN
  Administered 2023-09-29: 75 mL via INTRAVENOUS

## 2023-09-29 MED ORDER — ACETAMINOPHEN 650 MG RE SUPP: 650.0000 mg | RECTAL | Status: DC | PRN

## 2023-09-29 MED ORDER — ACETAMINOPHEN 325 MG PO TABS: 650.0000 mg | ORAL_TABLET | ORAL | Status: DC | PRN

## 2023-09-29 MED ORDER — STROKE: EARLY STAGES OF RECOVERY BOOK
Freq: Once | Status: AC
  Filled 2023-09-29: qty 1

## 2023-09-29 MED ORDER — CLOPIDOGREL BISULFATE 75 MG PO TABS
75.0000 mg | ORAL_TABLET | Freq: Every day | ORAL | Status: DC
  Administered 2023-09-30: 75 mg via ORAL
  Filled 2023-09-29: qty 1

## 2023-09-29 MED ORDER — SODIUM CHLORIDE 0.9 % IV SOLN: INTRAVENOUS | Status: AC

## 2023-09-29 NOTE — Progress Notes (Signed)
 EEG complete - results pending

## 2023-09-29 NOTE — ED Notes (Signed)
 Patient transported to MRI

## 2023-09-29 NOTE — Consult Note (Signed)
 NEUROLOGY CONSULT NOTE   Date of service: September 29, 2023 Patient Name: Michelle Hodges MRN:  [id number] DOB:  1945-05-31 Chief Complaint: CODE STROKE Requesting Provider: Yolande Lamar BROCKS, MD  History of Present Illness  SHERNITA RABINOVICH is a 78 y.o. female with hx of primary progressive MS, dementia, use of walker at home, HTN who was BIB EMS as an activated CODE STROKE due to acute onset of right-sided weakness and left gaze. Patient lives with daughter, [name], who witnessed the change and also noted left hand twitching that was not rhythmic in nature. En route with EMS, BP and CBG were WNL and patients symptoms improved.  On exam at bridge, patient is alert and oriented (states month was July instead of August), moves all extremities with bilateral leg weakness (chronic), RUE ataxia.  CTH negative, CTA negative for LVO.  Daughter, [name], confirmed description of today's acute change. Endorses that patient uses a walker at home and that she is her primary caretaker.   LKW: 2300 8/25 Modified rankin score: 3-Moderate disability-requires help but walks WITHOUT assistance IV Thrombolysis: No, outside of window EVT: No, no LVO   NIHSS components Score: Comment  1a Level of Conscious 0[x]  1[]  2[]  3[]      1b LOC Questions 0[]  1[x]  2[]       1c LOC Commands 0[x]  1[]  2[]       2 Best Gaze 0[x]  1[]  2[]       3 Visual 0[x]  1[]  2[]  3[]      4 Facial Palsy 0[x]  1[]  2[]  3[]      5a Motor Arm - left 0[x]  1[]  2[]  3[]  4[]  UN[]    5b Motor Arm - Right 0[x]  1[]  2[]  3[]  4[]  UN[]    6a Motor Leg - Left 0[]  1[x]  2[]  3[]  4[]  UN[]   chronic  6b Motor Leg - Right 0[]  1[x]  2[]  3[]  4[]  UN[]   chronic  7 Limb Ataxia 0[]  1[x]  2[]  UN[]      8 Sensory 0[x]  1[]  2[]  UN[]      9 Best Language 0[x]  1[]  2[]  3[]      10 Dysarthria 0[x]  1[]  2[]  UN[]      11 Extinct. and Inattention 0[]  1[x]  2[]       TOTAL:   5      ROS  Comprehensive ROS performed and pertinent positives documented in HPI   Past History   Past  Medical History:  Diagnosis Date   History of blood transfusion    Hypertension    MS (multiple sclerosis) (HCC)    Senile dementia (HCC)     Past Surgical History:  Procedure Laterality Date   COLONOSCOPY N/A 04/20/2012   Procedure: COLONOSCOPY;  Surgeon: Gordy CHRISTELLA Starch, MD;  Location: [facility] ENDOSCOPY;  Service: Gastroenterology;  Laterality: N/A;   CYSTECTOMY     in HS   TONSILLECTOMY      Family History: Family History  Problem Relation Age of Onset   Hypertension Mother    Breast cancer Mother 23   Hypertension Father    Stroke Father    Hypertension Sister    Stroke Sister    Breast cancer Sister    Hypertension Brother    Cancer - Other Maternal Grandmother        stomach   Multiple sclerosis Cousin    Colon cancer Neg Hx    Rectal cancer Neg Hx     Social History  reports that she quit smoking about 39 years ago. Her smoking use included cigarettes. She started smoking about 54 years ago. She  has a 3.8 pack-year smoking history. She has never used smokeless tobacco. She reports current alcohol use. She reports current drug use. Drug: Marijuana.  No Known Allergies  Medications  No current facility-administered medications for this encounter.  Current Outpatient Medications:    acetaminophen  (TYLENOL ) 325 MG tablet, Take 650 mg by mouth every 6 (six) hours as needed., Disp: , Rfl:    amLODipine  (NORVASC ) 5 MG tablet, TAKE 1 TABLET (5 MG TOTAL) BY MOUTH DAILY., Disp: 90 tablet, Rfl: 1   Biotin 10000 MCG TABS, Take 1 tablet by mouth daily., Disp: , Rfl:    fish oil-omega-3 fatty acids 1000 MG capsule, Take 2 g by mouth daily., Disp: , Rfl:    Homeopathic Products (OSCILLOCOCCINUM PO), Take 1 spray by mouth daily., Disp: , Rfl:    MAGNESIUM  BISGLYCINATE PO, Take 200 mg by mouth at bedtime., Disp: , Rfl:    melatonin 5 MG TABS, Take 1-2 tablets by mouth at bedtime., Disp: , Rfl:    Vitamin D-Vitamin K (VITAMIN K2-VITAMIN D3 PO), Take 1 tablet by mouth daily., Disp: ,  Rfl:   Vitals   Vitals:   Oct 24, 2023 0700  Weight: 57.4 kg    Body mass index is 18.69 kg/m.   Physical Exam   Constitutional: Appears well-developed and well-nourished.  Cardiovascular: Normal rate and regular rhythm. Normotensive.  Respiratory: Effort normal, non-labored breathing. No supplemental oxygen required.   Neurologic Examination   Neuro: Mental Status/Language: Patient is awake, alert, oriented to person, year, and situation. She knew she was in the hospital, but that it was [facility]. She told us  it was July, but then remembered it was August with correction.  Patient is able to give a clear and coherent history. No signs of aphasia or neglect. Cranial Nerves: II: Visual Fields are full. Blinks to threat bilaterally. Pupils are equal, round, and reactive to light.   III,IV, VI: EOMI without ptosis or diploplia.  V: Facial sensation is symmetric  VII: Facial movement is symmetric.  VIII: hearing is intact to voice X: Uvula elevates symmetrically. No hoarseness or dysarthria.  XI: Shoulder shrug is symmetric. XII: tongue is midline without atrophy or fasciculations.  Motor: Tone is normal. Bulk is normal.  Chronic weakness BLE Sensory: Sensation is symmetric to light touch in the arms and legs. Plantars: Toes are downgoing bilaterally.  Cerebellar: FNF with ataxia to RUE.   Labs/Imaging/Neurodiagnostic studies   CBC:  Recent Labs  Lab 2023/10/24 0732 10-24-23 0734  WBC 12.1*  --   NEUTROABS 11.1*  --   HGB 12.1 13.3  HCT 36.9 39.0  MCV 92.0  --   PLT 202  --    Basic Metabolic Panel:  Lab Results  Component Value Date   NA 139 10/24/2023   K 3.6 24-Oct-2023   CO2 26 12/31/2022   GLUCOSE 146 (H) 10/24/23   BUN 21 10-24-2023   CREATININE 1.00 2023/10/24   CALCIUM  9.8 12/31/2022   GFRNONAA 66 (L) 04/22/2012   GFRAA 76 (L) 04/22/2012   Lipid Panel:  Lab Results  Component Value Date   LDLCALC 72 12/31/2022   HgbA1c:  Lab Results  Component  Value Date   HGBA1C 5.6 04/16/2012   Urine Drug Screen:     Component Value Date/Time   LABOPIA NONE DETECTED 04/16/2012 0540   COCAINSCRNUR NONE DETECTED 04/16/2012 0540   LABBENZ NONE DETECTED 04/16/2012 0540   AMPHETMU NONE DETECTED 04/16/2012 0540   THCU POSITIVE (A) 04/16/2012 0540   LABBARB NONE DETECTED  04/16/2012 0540    Alcohol Level No results found for: [facility] INR  Lab Results  Component Value Date   INR 0.96 04/15/2012   APTT  Lab Results  Component Value Date   APTT 31 04/15/2012    CT Head without contrast(Personally reviewed): No acute intracranial abnormality. ASPECTS 10/10 Progressive generalized atrophy and white matter disease.  CT angio Head and Neck with contrast(Personally reviewed): No large vessel occlusion, hemodynamically significant stenosis, or aneurysm in the head or neck. Minimal atherosclerotic changes at the left carotid bifurcation.  MRI Brain with and without(Personally reviewed): ORDERED  Neurodiagnostics rEEG:  ORDERED  ASSESSMENT   Tania Steinhauser Inghram is a 78 y.o. female with hx of MS, Dementia, HTN who presented as a CODE STROKE d/t R weakness and L gaze. On ED assessment, her symptoms had improved, but she continued to have bilateral leg weakness, right neglect NIH: 5.  Seizure is less likely due to the gaze direction observed, but we will recommend a routine EEG to evaluate further. MRI is indicated, with and without, to further evaluate for stroke and progression of patient's MS. Her last MRI on chart review was in 2017. No significant stenosis to suggest symptomatic stenosis as the etiology for symptom onset today.   RECOMMENDATIONS   - MRI [place] with and without - routine EEG - Frequent Neuro checks per stroke unit protocol - TTE w/bubble - Lipid panel - Statin - will be started if LDL>70 or otherwise medically indicated - A1C - Antithrombotic - Plavix  load 300mg , ASA 324mg  today. Further antithrombotic recs per stroke  team.  - DVT ppx - ok for lovenox  - SBP goal - <220 for 24-48 hours after symptom onset  PRN labetalol if HR>60 and PRN Hydralazine if HR<60 - Telemetry monitoring for arrhythmia  - Swallow screen - will be performed prior to PO intake - Stroke education - will be given - risk factor modification - PT/OT/SLP  - Dispo: Admit to hospitalist for full stroke workup, stroke team to follow beginning 8/27 AM  ______________________________________________________________________    Signed, Rocky JAYSON Likes, NP [facility]    Attending Neurohospitalist Addendum Patient seen and examined with APP/Resident. Agree with the history and physical as documented above. Agree with the plan as documented, which I helped formulate. I have edited the note above to reflect my full findings and recommendations. I have independently reviewed the chart, obtained history, review of systems and examined the patient.I have personally reviewed pertinent head/neck/spine imaging (CT/MRI). Please feel free to call with any questions.  Presenting symptoms of severe right sided weakness and left gaze deviation.  Concerning for ischemic event.  She did improve significantly over the course of the stroke code.  She was not a TNK candidate due to last known well of 11 PM last night.  Seizure is possible but favored to be less likely given the direction of gaze deviation.  Recommend stroke/TIA workup per above as well as EEG.  Given patient's history of MS will get her MRI with and without contrast. Stroke team to f/u tomorrow  -- Elida Ross, MD [facility] [phone]  If 7pm- 7am, please page neurology on call as listed in AMION.

## 2023-09-29 NOTE — Progress Notes (Signed)
  Echocardiogram 2D Echocardiogram has been performed.  Tinnie FORBES Gosling RDCS 09/29/2023, 3:28 PM

## 2023-09-29 NOTE — ED Notes (Signed)
 Hourly rounding complete. Pt alert or resting, no distress noted, offered toileting and diet as appropriate. Side rails up, call light within reach. Pt denies pain or further needs at this time.  Family at bedside

## 2023-09-29 NOTE — ED Notes (Signed)
 Pt exits my care at this time

## 2023-09-29 NOTE — ED Notes (Signed)
 Hourly rounding complete. Pt alert or resting, no distress noted, offered toileting and diet as appropriate. Side rails up, call light within reach. Pt denies pain or further needs at this time.

## 2023-09-29 NOTE — ED Notes (Signed)
 CCMD notified to be placed on the monitor

## 2023-09-29 NOTE — ED Triage Notes (Signed)
 Pt here from home called out for right side weakness speech difficulty LVO was a 4 with ems , pt improved in route to the hospital , [facility] of a 5 on arrival , pt does have MS

## 2023-09-29 NOTE — H&P (Signed)
 History and Physical    Patient: Michelle Hodges FMW:[id number] DOB: Aug 22, 1945 DOA: 09/29/2023 DOS: the patient was seen and examined on 09/29/2023 PCP: Caro Harlene POUR, NP  Patient coming from: Home  Chief Complaint:  Chief Complaint  Patient presents with   Code Stroke   HPI: Michelle Hodges is a 78 y.o. female with medical history significant of hypertension, MS, senile dementia, presents with disorientation and weakness. She is accompanied by her daughter.  This morning, she experienced disorientation and weakness, prompting her daughter to seek medical attention. She felt both hot and cold, and her hands were shaking, particularly the left hand. She was unable to answer questions and appeared disoriented, although she recognized her daughter.  She has a history of multiple sclerosis, which typically affects her right side more than her left. This morning, she was unable to walk, which is a change from her baseline where she can usually get around with a rollator or sometimes walk independently. Her daughter noted that she was unable to get up this morning, which was unusual.  She underwent a CT scan and MRI, which were reviewed. She underwent a CT scan and MRI, and her family was told that the MRI was okay and showed chronic changes related to her MS.  She reported a headache earlier while at the hospital, but it has since resolved. No chest pain or discomfort at the time of the conversation.  She has been experiencing cramping, particularly in the right hand, but has not had any difficulties with swallowing. She has been active despite her MS, engaging in exercise, although she has not overexerted herself recently.   In the emergency department patient was seen as a code stroke.  CT scan of the head did not reveal any acute intercranial abnormality.  CT angiogram of the head and neck did not reveal any large vessel occlusion.  Patient was not a candidate for thrombolytics due to  as symptoms are resolving.  Labs significant for WBC 12.1 and glucose 146.  Neurology recommending admission for completion of stroke workup.  Patient had been given Ativan  1 mg p.o., Plavix  300 mg p.o., and aspirin  324 mg p.o.   Review of Systems: As mentioned in the history of present illness. All other systems reviewed and are negative. Past Medical History:  Diagnosis Date   History of blood transfusion    Hypertension    MS (multiple sclerosis) (HCC)    Senile dementia (HCC)    Past Surgical History:  Procedure Laterality Date   COLONOSCOPY N/A 04/20/2012   Procedure: COLONOSCOPY;  Surgeon: Gordy CHRISTELLA Starch, MD;  Location: [facility] ENDOSCOPY;  Service: Gastroenterology;  Laterality: N/A;   CYSTECTOMY     in HS   TONSILLECTOMY     Social History:  reports that she quit smoking about 39 years ago. Her smoking use included cigarettes. She started smoking about 54 years ago. She has a 3.8 pack-year smoking history. She has never used smokeless tobacco. She reports current alcohol use. She reports current drug use. Drug: Marijuana.  No Known Allergies  Family History  Problem Relation Age of Onset   Hypertension Mother    Breast cancer Mother 20   Hypertension Father    Stroke Father    Hypertension Sister    Stroke Sister    Breast cancer Sister    Hypertension Brother    Cancer - Other Maternal Grandmother        stomach   Multiple sclerosis Cousin  Colon cancer Neg Hx    Rectal cancer Neg Hx     Prior to Admission medications   Medication Sig Start Date End Date Taking? Authorizing Provider  acetaminophen  (TYLENOL ) 325 MG tablet Take 650 mg by mouth every 6 (six) hours as needed.   Yes [provider]  amLODipine  (NORVASC ) 5 MG tablet TAKE 1 TABLET (5 MG TOTAL) BY MOUTH DAILY. 06/16/23  Yes Caro Harlene POUR, NP  aspirin  EC 81 MG tablet Take 81 mg by mouth daily. Swallow whole.   Yes [provider]  MAGNESIUM  BISGLYCINATE PO Take 200 mg by mouth at bedtime.    Yes [provider]  melatonin 5 MG TABS Take 1-2 tablets by mouth at bedtime.   Yes [provider]  Vitamin D-Vitamin K (VITAMIN K2-VITAMIN D3 PO) Take 1 tablet by mouth daily.   Yes [provider]  Biotin 89999 MCG TABS Take 1 tablet by mouth daily. Patient not taking: Reported on 09/29/2023    [provider]  fish oil-omega-3 fatty acids 1000 MG capsule Take 2 g by mouth daily. Patient not taking: Reported on 09/29/2023    [provider]  Homeopathic Products (OSCILLOCOCCINUM PO) Take 1 spray by mouth daily. Patient not taking: Reported on 09/29/2023    [provider]    Physical Exam: Vitals:   09/29/23 0700 09/29/23 0754  BP:  139/83  Pulse:  93  Resp:  18  Temp:  98.8 F (37.1 C)  TempSrc:  Oral  SpO2:  96%  Weight: 57.4 kg    Constitutional: Elderly female currently in no acute distress Eyes: PERRL, lids and conjunctivae normal ENMT: Mucous membranes are moist.  .Normal dentition.  Neck: normal, supple, no masses, no thyromegaly Respiratory: clear to auscultation bilaterally, no wheezing, no crackles. Normal respiratory effort. No accessory muscle use.  Cardiovascular: Regular rate and rhythm, no murmurs / rubs / gallops. No extremity edema. 2+ pedal pulses. No carotid bruits.  Abdomen: no tenderness, no masses palpated. Bowel sounds positive.  Musculoskeletal: no clubbing / cyanosis. No joint deformity upper and lower extremities. Good ROM, no contractures. Normal muscle tone.  Skin: no rashes, lesions, ulcers. No induration Neurologic: CN 2-12 grossly intact.   Strength 4/5 on the right upper and lower extremity.  Strength 5/5 on the left upper and lower extremity Psychiatric:  Alert and oriented person and place, but not time. Normal mood.   Data Reviewed:  EKG revealed normal sinus rhythm at 89 bpm reviewed labs, imaging, and pertinent records as documented.  Assessment and Plan:  Suspected TIA Patient  presented with acute onset of worsening right-sided weakness with possible right-sided neglect and confusion.  Symptoms resolved prior to the patient getting into the hospital.  CT imaging of the head and CTA of the head and neck did not reveal any acute intracranial abnormality.  Patient was not a candidate for thrombolytics due to resolution in symptoms and no large vessel occlusion was seen.  MRI of the brain revealed no acute intracranial abnormality with chronic demyelinating disease.  Neurology recommended admission for completion of stroke workup.  Patient was given full dose aspirin  and Plavix . - Admit to a telemetry bed - Neurochecks - Check echocardiogram - Follow-up hemoglobin A1c and lipid panel - Continue aspirin , Plavix   - PT/OT/speech to evaluate and treat - Neurology was consulted, will follow-up for any further recommendations  Leukocytosis Acute.  WBC elevated at 12.1. - Check chest x-ray and urinalysis  Essential hypertension Blood pressures noted to  be 98/56 to 139/82.  Home blood pressure regimen includes amlodipine . - Held amlodipine  allowing for permissive hypertension at this time.  Multiple sclerosis Patient has some baseline right-sided weakness for which she ambulates with use of right walker.  Is not currently on any medications for treatment.  MRI of the brain noted chronic demyelinating disease stable since 2017. - Continue outpatient follow-up with neurology  DVT prophylaxis: Lovenox  Advance Care Planning:   Code Status: Full Code   Consults: Neurology  Family Communication: Daughter updated at bedside  Severity of Illness: The appropriate patient status for this patient is OBSERVATION. Observation status is judged to be reasonable and necessary in order to provide the required intensity of service to ensure the patient's safety. The patient's presenting symptoms, physical exam findings, and initial radiographic and laboratory data in the context of their  medical condition is felt to place them at decreased risk for further clinical deterioration. Furthermore, it is anticipated that the patient will be medically stable for discharge from the hospital within 2 midnights of admission.   Author: Maximino DELENA Sharps, MD 09/29/2023 8:16 AM  For on call review [email or web address].

## 2023-09-29 NOTE — ED Provider Notes (Signed)
 [place] [facility] AT [facility] Provider Note   CSN: [id number] Arrival date & time: 09/29/23  9274  An emergency department physician performed an initial assessment on this suspected stroke patient at 9.  Patient presents with: Code Stroke   Ms. Michelle Hodges is a 78 y.o. female.   78 year old female with a history of MS, hypertension, and dementia who presents to the emergency department as a code stroke activation.  History obtained per EMS.  Patient was last known well at 11 PM last night.  Today was found to have right upper extremity weakness with a left gaze preference.  When EMS arrived on scene she also had some rhythmic jerking of her left upper extremity but was responding during that episode.  Does reportedly have chronic lower extremity weakness at her baseline.       Prior to Admission medications   Medication Sig Start Date End Date Taking? Authorizing Provider  acetaminophen  (TYLENOL ) 325 MG tablet Take 650 mg by mouth every 6 (six) hours as needed.   Yes [provider]  amLODipine  (NORVASC ) 5 MG tablet TAKE 1 TABLET (5 MG TOTAL) BY MOUTH DAILY. 06/16/23  Yes Caro Harlene POUR, NP  aspirin  EC 81 MG tablet Take 81 mg by mouth daily. Swallow whole.   Yes [provider]  MAGNESIUM  BISGLYCINATE PO Take 200 mg by mouth at bedtime.   Yes [provider]  melatonin 5 MG TABS Take 1-2 tablets by mouth at bedtime.   Yes [provider]  Vitamin D-Vitamin K (VITAMIN K2-VITAMIN D3 PO) Take 1 tablet by mouth daily.   Yes [provider]  Biotin 89999 MCG TABS Take 1 tablet by mouth daily. Patient not taking: Reported on 09/29/2023    [provider]  fish oil-omega-3 fatty acids 1000 MG capsule Take 2 g by mouth daily. Patient not taking: Reported on 09/29/2023    [provider]  Homeopathic Products (OSCILLOCOCCINUM PO) Take 1 spray by mouth daily. Patient not taking: Reported on  09/29/2023    [provider]    Allergies: Patient has no known allergies.    Review of Systems  Updated Vital Signs BP 116/77   Pulse 94   Temp 98.8 F (37.1 C) (Oral)   Resp (!) 21   Wt 57.4 kg   SpO2 94%   BMI 18.69 kg/m   Physical Exam Constitutional:      General: She is not in acute distress.    Appearance: Normal appearance. She is not ill-appearing.     Comments: Alert and oriented to self.  Thought she was in [facility].  Thought that the month was July.  HENT:     Head: Normocephalic and atraumatic.  Pulmonary:     Effort: Pulmonary effort is normal. No respiratory distress.  Neurological:     Mental Status: She is alert.     Comments: No gross cranial nerve deficits.  Able to hold all 4 extremities antigravity.  Intact sensation to light touch in all 4 extremities     (all labs ordered are listed, but only abnormal results are displayed) Labs Reviewed  APTT - Abnormal; Notable for the following components:      Result Value   aPTT <22 (*)    All other components within normal limits  CBC - Abnormal; Notable for the following components:   WBC 12.1 (*)    All other components within normal limits  DIFFERENTIAL - Abnormal; Notable for the following  components:   Neutro Abs 11.1 (*)    Lymphs Abs 0.6 (*)    Abs Immature Granulocytes 0.14 (*)    All other components within normal limits  COMPREHENSIVE METABOLIC PANEL WITH GFR - Abnormal; Notable for the following components:   Glucose, Bld 146 (*)    Total Bilirubin 1.3 (*)    GFR, Estimated 59 (*)    All other components within normal limits  I-STAT CHEM 8, ED - Abnormal; Notable for the following components:   Glucose, Bld 146 (*)    TCO2 21 (*)    All other components within normal limits  CBG MONITORING, ED - Abnormal; Notable for the following components:   Glucose-Capillary 164 (*)    All other components within normal limits  ETHANOL  PROTIME-INR  RAPID URINE DRUG SCREEN, HOSP  PERFORMED  HEMOGLOBIN A1C    EKG: EKG Interpretation Date/Time:  Tuesday September 29 2023 07:50:09 EDT Ventricular Rate:  89 PR Interval:  146 QRS Duration:  94 QT Interval:  321 QTC Calculation: 391 R Axis:   62  Text Interpretation: Sinus rhythm Probable left atrial enlargement Borderline repolarization abnormality Confirmed by Yolande Charleston [phone]) on 09/29/2023 8:18:38 AM  Radiology: MR BRAIN W WO CONTRAST Result Date: 09/29/2023 CLINICAL DATA:  78 year old female with unexplained altered mental status. Right side weakness. Speech difficulty. Code stroke presentation this morning. History of multiple sclerosis diagnosed in 2014. EXAM: MRI HEAD WITHOUT AND WITH CONTRAST TECHNIQUE: Multiplanar, multiecho pulse sequences of the brain and surrounding structures were obtained without and with intravenous contrast. CONTRAST:  6mL GADAVIST  GADOBUTROL  1 MMOL/ML IV SOLN COMPARISON:  CT head, CTA head and neck this morning. Brain MRI 03/31/2015. FINDINGS: Brain: No restricted diffusion or evidence of acute infarction. Areas of chronic T2 and FLAIR signal abnormality in the brain compatible with chronic demyelinating disease. Chronic white matter and corpus callosum volume loss. Confluent periventricular and scattered patchy additional cerebral white matter involvement. Chronic right superior frontal gyrus motor strip cortical involvement (series 6, image 28). Chronic dorsal left brainstem and patchy left cerebellar involvement (series 6, images 11 and 5). Some areas of T2 shine through. No abnormal enhancement identified. Compared to 2017, in extent of disease not significantly changed on sagittal FLAIR imaging. No superimposed midline shift, mass effect, evidence of mass lesion, ventriculomegaly, extra-axial collection or acute intracranial hemorrhage. Cervicomedullary junction and pituitary are within normal limits. No chronic cerebral blood products on SWI. Vascular: Major intracranial vascular flow  voids are preserved. Following contrast major dural venous sinuses are enhancing and appear to be patent. Skull and upper cervical spine: Visualized bone marrow signal is within normal limits. Grossly negative visible cervical spine, spinal cord. Sinuses/Orbits: Disconjugate gaze. Chronic paranasal sinus disease is stable. Other: Mastoids are clear. Visible internal auditory structures appear normal. Negative visible scalp and face. IMPRESSION: 1. Negative for acute infarct. 2. Advanced chronic demyelinating disease. No substantial progression since 2017, and no active demyelination identified. Electronically Signed   By: VEAR Hurst M.D.   On: 09/29/2023 09:44   CT ANGIO HEAD NECK W WO CM (CODE STROKE) Addendum Date: 09/29/2023 ** ADDENDUM #1 ** THE INCORRECT TEMPLATE WAS USED FOR THE ORIGINAL REPORT.  IT IS CORRECTED BELOW. EXAM: CTA HEAD AND NECK WITH AND WITHOUT 09/29/2023 07:42:00 AM TECHNIQUE: CTA of the head and neck was performed with and without the administration of intravenous contrast. Multiplanar 2D and/or 3D reformatted images are provided for review. Automated exposure control, iterative reconstruction, and/or weight based adjustment of the mA/kV  was utilized to reduce the radiation dose to as low as reasonably achievable. Stenosis of the internal carotid arteries measured using NASCET criteria. COMPARISON: None available CLINICAL HISTORY: Neuro deficit, acute, stroke suspected. Right side weakness. Left sided gaze. FINDINGS: CTA NECK: AORTIC ARCH AND ARCH VESSELS: No dissection or arterial injury. No significant stenosis of the brachiocephalic or subclavian arteries. CERVICAL CAROTID ARTERIES: Minimal atherosclerotic changes are present at the left carotid bifurcation. No dissection, arterial injury, or hemodynamically significant stenosis by NASCET criteria. CERVICAL VERTEBRAL ARTERIES: No dissection, arterial injury, or significant stenosis. LUNGS AND MEDIASTINUM: Unremarkable. SOFT TISSUES: No  acute abnormality. BONES: No acute abnormality. CTA HEAD: ANTERIOR CIRCULATION: No significant stenosis of the internal carotid arteries. No significant stenosis of the anterior cerebral arteries. No significant stenosis of the middle cerebral arteries. No aneurysm. POSTERIOR CIRCULATION: No significant stenosis of the posterior cerebral arteries. No significant stenosis of the basilar artery. No significant stenosis of the vertebral arteries. No aneurysm. OTHER: No dural venous sinus thrombosis on this non-dedicated study. The pertinent results were texted to Dr.  via the amion system at 07:46 am. IMPRESSION: 1. No large vessel occlusion, hemodynamically significant stenosis, or aneurysm in the head or neck. 2. Minimal atherosclerotic changes at the left carotid bifurcation. Electronically signed by: Lonni Necessary MD 09/29/2023 08:44 AM EDT RP Workstation: [id number]   Result Date: 09/29/2023 ** ORIGINAL REPORT ** EXAM: CT NECK AND CHEST WITH AND WITHOUT CONTRAST 09/29/2023 07:42:00 AM TECHNIQUE: CT of the neck and chest was performed with and without the administration of intravenous contrast. Multiplanar reformatted images are provided for review. Automated exposure control, iterative reconstruction, and/or weight based adjustment of the mA/kV was utilized to reduce the radiation dose to as low as reasonably achievable. Incidental adrenal and/or renal findings do not require follow up imaging. COMPARISON: None available. CLINICAL HISTORY: Neuro deficit, acute, stroke suspected. Right side weakness. Left sided gaze. FINDINGS: NECK: PHARYNX: The nasopharynx, oropharynx, and hypopharynx are unremarkable. No pharyngeal mucosal based mass lesions. LARYNX: The larynx is unremarkable. Normal epiglottis. RETROPHARYNGEAL SPACE: No retropharyngeal soft tissue swelling or gas. SALIVARY GLANDS: The parotid and submandibular glands are unremarkable. LYMPH NODES: No lymphadenopathy. THYROID: The thyroid gland is  unremarkable. No nodule. LIMITED BRAIN, ORBITS AND SINUSES: The visualized portion of the intracranial contents demonstrate no acute abnormality. The visualized portion of the orbits are without acute abnormality. The visualized paranasal sinuses and mastoid air cells demonstrate no acute abnormality. BONES: No acute osseous abnormality. CHEST: MEDIASTINUM: Heart and pericardium are unremarkable. The central airways are clear. LYMPH NODES: No mediastinal, hilar or axillary lymphadenopathy. LUNGS AND PLEURA: No focal consolidation or pulmonary edema. No pleural effusion or pneumothorax. SOFT TISSUES AND BONES: No acute abnormality of the bones or soft tissues. UPPER ABDOMEN: Limited images of the upper abdomen demonstrates no acute abnormality. VASCULATURE: Minimal atherosclerotic changes are present at the left carotid bifurcation. IMPRESSION: 1. No acute findings within the soft tissues of the neck. 2. No acute abnormality of the chest. 3. Minimal atherosclerotic changes at the left carotid bifurcation. The pertinent results were texted to Dr. Matthews via the [facility] system at 7:46am. Electronically signed by: Lonni Necessary MD 09/29/2023 08:02 AM EDT RP Workstation: [id number]   CT HEAD CODE STROKE WO CONTRAST Result Date: 09/29/2023 EXAM: CT HEAD WITHOUT CONTRAST 09/29/2023 07:34:00 AM TECHNIQUE: CT of the head was performed without the administration of intravenous contrast. Automated exposure control, iterative reconstruction, and/or weight based adjustment of the mA/kV was utilized to reduce the radiation dose to as  low as reasonably achievable. COMPARISON: MRN head without contrast 03/31/2015. CLINICAL HISTORY: Neuro deficit, acute, stroke suspected. LKW 2300; Left gaze. FINDINGS: BRAIN AND VENTRICLES: No acute hemorrhage. Gray-white differentiation is preserved. No hydrocephalus. No extra-axial collection. No mass effect or midline shift. Progressive generalized atrophy and white matter disease is noted.  [place] stroke program early CT (ASPECT) score: Ganglionic (caudate, IC, lentiform nucleus, insula, M1-M3): 7 Supraganglionic (M4-M6): 3 Total: 10 ORBITS: No acute abnormality. SINUSES: A polyp or mucous retention cyst is present in the left maxillary sinus. Mild circumferential mucosal thickening is present in the left maxillary sinus. The posterior left nasal cavity is opacified with what appears to be a posterior polyp. Nasal sinuses and mastoid air cells are otherwise clear. SOFT TISSUES AND SKULL: No acute soft tissue abnormality. No skull fracture. IMPRESSION: 1. No acute intracranial abnormality. 2. ASPECTS 10/10 3. Progressive generalized atrophy and white matter disease. The pertinent results were texted to Dr. Matthews via the [facility] system at 07:46 am. Electronically signed by: Lonni Necessary MD 09/29/2023 07:49 AM EDT RP Workstation: [id number]     Procedures   Medications Ordered in the ED   stroke: early stages of recovery book (has no administration in time range)  0.9 %  sodium chloride  infusion (has no administration in time range)  acetaminophen  (TYLENOL ) tablet 650 mg (has no administration in time range)    Or  acetaminophen  (TYLENOL ) 160 MG/5ML solution 650 mg (has no administration in time range)    Or  acetaminophen  (TYLENOL ) suppository 650 mg (has no administration in time range)  senna-docusate (Senokot-S) tablet 1 tablet (has no administration in time range)  enoxaparin  (LOVENOX ) injection 30 mg (has no administration in time range)  aspirin  EC tablet 81 mg (has no administration in time range)  clopidogrel  (PLAVIX ) tablet 75 mg (has no administration in time range)  iohexol  (OMNIPAQUE ) 350 MG/ML injection 75 mL (75 mLs Intravenous Contrast Given 09/29/23 0742)  aspirin  chewable tablet 324 mg (324 mg Oral Given 09/29/23 0816)  clopidogrel  (PLAVIX ) tablet 300 mg (300 mg Oral Given 09/29/23 0816)  LORazepam  (ATIVAN ) tablet 1 mg (1 mg Oral Given 09/29/23 0816)  gadobutrol   (GADAVIST ) 1 MMOL/ML injection 6 mL (6 mLs Intravenous Contrast Given 09/29/23 0904)    Clinical Course as of 09/29/23 1023  Tue Sep 29, 2023  0752 Dr Matthews has evaluated the patient.  Not a thrombectomy candidate.  No bleed on CT head.  Recommends aspirin  and Plavix  and MRI of the brain with and without contrast and admission to hospitalist [RP]  0818 Dr Claudene from hospitalist to admit the patient [RP]    Clinical Course User Index [RP] Yolande Lamar BROCKS, MD                                 Medical Decision Making Amount and/or Complexity of Data Reviewed Labs: ordered. Radiology: ordered.  Risk OTC drugs. Prescription drug management. Decision regarding hospitalization.   Nevena Rozenberg Curtner is a 78 y.o. female with comorbidities that complicate the patient evaluation including MS, hypertension, and dementia who presents to the emergency department with right upper extremity weakness and a left gaze preference and possible rhythmic like jerking of her left upper extremity  Initial Ddx:  Stroke, ICH, hypoglycemia, meningitis/infection, seizure  MDM:  Concerned about a stroke given the patient's symptoms.  Appears to potentially have had a focal seizure as well.  Patient is protecting airway at this time.  Code stroke activated by EMS.  It appears that her symptoms have resolved at this point in time and she is back to her neurologic baseline.  Will check a CBG to ensure that the patient is not hypoglycemic and a CT of the head to evaluate for ICH.  No signs of infection currently on history or exam to suggest an infectious cause of their symptoms.  Fleeta negative so does not need a perfusion at this point in time per neurology.  Plan:  CBG Labs Code stroke activation CT head CT angio head and neck  ED Summary/Re-evaluation:  CT head and CT angio without acute findings.  Lab work unremarkable.  Admitted to hospitalist for TIA workup  This patient presents to the ED for concern of  complaints listed in HPI, this involves an extensive number of treatment options, and is a complaint that carries with it a high risk of complications and morbidity. Disposition including potential need for admission considered.   Dispo: Admit to Floor  Additional history obtained from EMS Records reviewed Outpatient Clinic Notes The following labs were independently interpreted: Chemistry and show no acute abnormality I independently reviewed the following imaging with scope of interpretation limited to determining acute life threatening conditions related to emergency care: CT Head and agree with the radiologist interpretation with the following exceptions: none I personally reviewed and interpreted cardiac monitoring: normal sinus rhythm  I personally reviewed and interpreted the pt's EKG: see above for interpretation  I have reviewed the patients home medications and made adjustments as needed Consults: Neurology Social Determinants of health:  Geriatric  CRITICAL CARE Performed by: Lamar JAYSON Shan   Total critical care time: 30 minutes  Critical care time was exclusive of separately billable procedures and treating other patients.  Critical care was necessary to treat or prevent imminent or life-threatening deterioration.  Critical care was time spent personally by me on the following activities: development of treatment plan with patient and/or surrogate as well as nursing, discussions with consultants, evaluation of patient's response to treatment, examination of patient, obtaining history from patient or surrogate, ordering and performing treatments and interventions, ordering and review of laboratory studies, ordering and review of radiographic studies, pulse oximetry and re-evaluation of patient's condition.    Final diagnoses:  TIA (transient ischemic attack)  Focal seizure [facility])    ED Discharge Orders     None          Shan Lamar JAYSON, MD 09/29/23  1023

## 2023-09-29 NOTE — Progress Notes (Signed)
 PT Cancellation Note  Patient Details Name: Michelle Hodges MRN: [id number] DOB: Oct 25, 1945   Cancelled Treatment:    Reason Eval/Treat Not Completed: Patient at procedure or test/unavailable  Patient undergoing echo.    Macario GORMAN, PT Acute Rehabilitation Services  Office [phone]  [name] 09/29/2023, 3:09 PM

## 2023-09-29 NOTE — Procedures (Signed)
 Patient Name: Michelle Hodges  MRN: [id number]  Epilepsy Attending: Arlin MALVA Krebs  Referring Physician/Provider: Judithe Rocky BROCKS, NP  Date: 09/29/2023 Duration: 22.42 mins  Patient history: 78yo F with right sided weakness, and left gaze. EEG to evaluate for seizure  Level of alertness: Awake, asleep  AEDs during EEG study: Ativan   Technical aspects: This EEG study was done with scalp electrodes positioned according to the 10-20 International system of electrode placement. Electrical activity was reviewed with band pass filter of 1-70Hz , sensitivity of 7 uV/mm, display speed of 13mm/sec with a 60Hz  notched filter applied as appropriate. EEG data were recorded continuously and digitally stored.  Video monitoring was available and reviewed as appropriate.  Description: The posterior dominant rhythm consists of 8-9 Hz activity of moderate voltage (25-35 uV) seen predominantly in posterior head regions, symmetric and reactive to eye opening and eye closing. Sleep was characterized by vertex waves, sleep spindles (12 to 14 Hz), maximal frontocentral region.  EEG showed intermittent generalized 3-5 hz theta- delta slowing. Hyperventilation and photic stimulation were not performed.     ABNORMALITY - Intermittent slow, generalized  IMPRESSION: This study is suggestive of mild diffuse encephalopathy. No seizures or epileptiform discharges were seen throughout the recording.  [name] O Teshara Moree

## 2023-09-29 NOTE — Code Documentation (Addendum)
 Stroke Response Nurse Documentation Code Documentation  Michelle Hodges is a 78 y.o. female arriving to [facility]  via [facility] on 09/29/2023 with past medical hx of MS. On Aspirin  and Plavix . Code stroke was activated by EMS.   Patient from home where she was LKW at 2300 last night and now complaining of right sided weakness, and left gaze.   Stroke team at the bedside on patient arrival. Labs drawn and patient cleared for CT by Dr. Yolande. Patient to CT with team. NIHSS 5, see documentation for details and code stroke times. Patient with disoriented, bilateral leg weakness, right limb ataxia, and right neglect on exam. The following imaging was completed:  CT Head and CTA. Patient is not a candidate for IV Thrombolytic due to she is outside of the treatment window. Patient is not a candidate for IR due to LVO negative, improvement in exam..   Care Plan: q 2 NIHSS and VS x 12, then q 4.                   NPO until Stroke Swallow passed.     Bedside handoff with ED RN [place].    Harmonii Karle Livengood  Stroke Response RN

## 2023-09-29 NOTE — Evaluation (Signed)
 Physical Therapy Evaluation Patient Details Name: Michelle Hodges MRN: [id number] DOB: 07-Sep-1945 Today's Date: 09/29/2023  History of Present Illness  78 year old female with a history of MS, hypertension, and dementia who presents to the emergency department as a code stroke 09/29/23. RUE weakness and left gaze preference. CT negative for bleed or LVO. MRI negative for acute changes  Clinical Impression   Pt admitted secondary to problem above with deficits below. On initial visit, daughter was not present and pt provided incorrect prior functional status and home set-up. Per daughter, pt normally does not want any help and rarely asks for assistance. She perform bed mobility, transfers, ambulation with rollator, and even gets down into tub and gets herself out. Pt currently requires min assist for bed mobility, transfers, and was unsafe to ambulate with one person assist plus IV pole with pt fearful of RW. Currently pt is not ready to discharge home from a mobility perspective. Will plan to see pt again 8/27 a.m. with a second person and a rollator to further assess mobility. Currently she is observation status which also limits alternatives for followup therapies. Anticipate patient will benefit from PT to address problems listed below. Will continue to follow acutely to maximize functional mobility, independence, and safety.           If plan is discharge home, recommend the following: A little help with walking and/or transfers;A little help with bathing/dressing/bathroom;Assistance with cooking/housework;Direct supervision/assist for medications management;Direct supervision/assist for financial management;Assist for transportation;Supervision due to cognitive status   Can travel by private vehicle        Equipment Recommendations Wheelchair (measurements PT) (vs transport chair)  Recommendations for Other Services  OT consult    Functional Status Assessment Patient has had a recent  decline in their functional status and demonstrates the ability to make significant improvements in function in a reasonable and predictable amount of time.     Precautions / Restrictions Precautions Precautions: Fall Recall of Precautions/Restrictions: Impaired Precaution/Restrictions Comments: 2 recent falls Restrictions Weight Bearing Restrictions Per Provider Order: No      Mobility  Bed Mobility Overal bed mobility: Needs Assistance Bed Mobility: Rolling, Sidelying to Sit, Sit to Supine Rolling: Supervision, Used rails Sidelying to sit: Min assist, HOB elevated, Used rails   Sit to supine: Min assist   General bed mobility comments: required assist to move legs over EOB and to raise torso; assist to raise legs back onto bed    Transfers Overall transfer level: Needs assistance Equipment used: Rolling walker (2 wheels) Transfers: Sit to/from Stand Sit to Stand: Min assist           General transfer comment: bed at lowest height; boosting assist    Ambulation/Gait               General Gait Details: unsafe with 1 person and IV. Was able to take ~5 small side steps along EOB with legs shakey  Stairs            Wheelchair Mobility     Tilt Bed    Modified Rankin (Stroke Patients Only) Modified Rankin (Stroke Patients Only) Pre-Morbid Rankin Score: Moderate disability Modified Rankin: Severe disability     Balance Overall balance assessment: Needs assistance Sitting-balance support: No upper extremity supported, Feet supported Sitting balance-Leahy Scale: Good     Standing balance support: Bilateral upper extremity supported, During functional activity, Reliant on assistive device for balance Standing balance-Leahy Scale: Poor  Pertinent Vitals/Pain Pain Assessment Pain Assessment: No/denies pain    Home Living Family/patient expects to be discharged to:: Private residence Living Arrangements:  Children (daughter) Available Help at Discharge: Family;Available 24 hours/day Type of Home: House Home Access: Level entry       Home Layout: One level Home Equipment: Rollator (4 wheels);Shower seat;Grab bars - tub/shower;Other (comment) (trirollator; chair on bed risers) Additional Comments: Daughter arrived and corrected some above information (which pt had incorrectly answered)    Prior Function Prior Level of Function : Needs assist;History of Falls (last six months) (2 recent falls)             Mobility Comments: daughter reports pt rarely needs assistance and typically does not want help. She walks with rollator; can stand from normal height recliner, but also has an elevated chair she can use ADLs Comments: daughter reports pt typically gets down into tub and gets herself out; she rarely needs assistance     Extremity/Trunk Assessment   Upper Extremity Assessment Upper Extremity Assessment: Defer to OT evaluation    Lower Extremity Assessment Lower Extremity Assessment: RLE deficits/detail;LLE deficits/detail RLE Deficits / Details: knee extension 3+ and requires assist to stand from EOB LLE Deficits / Details: knee extension 3+ (and requires assist sit to stand)    Cervical / Trunk Assessment Cervical / Trunk Assessment: Other exceptions Cervical / Trunk Exceptions: scoliosis  Communication   Communication Communication: No apparent difficulties    Cognition Arousal: Alert Behavior During Therapy: WFL for tasks assessed/performed                           PT - Cognition Comments: hospital but not which one Following commands: Impaired Following commands impaired: Follows one step commands with increased time     Cueing Cueing Techniques: Verbal cues, Gestural cues, Tactile cues     General Comments General comments (skin integrity, edema, etc.): Daughter arrived after PT had left, however RN reached out to let me know she was here as I had  many questions re: pt's home setup and prior status    Exercises     Assessment/Plan    PT Assessment Patient needs continued PT services  PT Problem List Decreased strength;Decreased activity tolerance;Decreased balance;Decreased mobility;Decreased cognition;Decreased knowledge of use of DME;Decreased safety awareness       PT Treatment Interventions DME instruction;Gait training;Functional mobility training;Therapeutic activities;Therapeutic exercise;Balance training;Neuromuscular re-education;Cognitive remediation;Patient/family education    PT Goals (Current goals can be found in the Care Plan section)  Acute Rehab PT Goals Patient Stated Goal: return home PT Goal Formulation: With patient/family Time For Goal Achievement: 10/13/23 Potential to Achieve Goals: Good    Frequency Min 3X/week     Co-evaluation               AM-PAC PT 6 Clicks Mobility  Outcome Measure Help needed turning from your back to your side while in a flat bed without using bedrails?: A Little Help needed moving from lying on your back to sitting on the side of a flat bed without using bedrails?: A Little Help needed moving to and from a bed to a chair (including a wheelchair)?: A Lot Help needed standing up from a chair using your arms (e.g., wheelchair or bedside chair)?: A Little Help needed to walk in hospital room?: Total Help needed climbing 3-5 steps with a railing? : Total 6 Click Score: 13    End of Session   Activity Tolerance: Patient  limited by fatigue Patient left: in bed;with call bell/phone within reach Nurse Communication: Mobility status PT Visit Diagnosis: Other abnormalities of gait and mobility (R26.89);Repeated falls (R29.6);Muscle weakness (generalized) (M62.81)    Time: 8446-8363 PT Time Calculation (min) (ACUTE ONLY): 43 min   Charges:   PT Evaluation $PT Eval Moderate Complexity: 1 Mod PT Treatments $Therapeutic Activity: 8-22 mins $Self Care/Home  Management: 8-22 PT General Charges $$ ACUTE PT VISIT: 1 Visit          Macario RAMAN, PT Acute Rehabilitation Services  Office [phone]   [name] 09/29/2023, 5:18 PM

## 2023-09-30 ENCOUNTER — Other Ambulatory Visit (HOSPITAL_COMMUNITY): Payer: Self-pay

## 2023-09-30 DIAGNOSIS — I69351 Hemiplegia and hemiparesis following cerebral infarction affecting right dominant side: Secondary | ICD-10-CM

## 2023-09-30 DIAGNOSIS — I739 Peripheral vascular disease, unspecified: Secondary | ICD-10-CM

## 2023-09-30 DIAGNOSIS — E785 Hyperlipidemia, unspecified: Secondary | ICD-10-CM | POA: Diagnosis not present

## 2023-09-30 DIAGNOSIS — G35 Multiple sclerosis: Secondary | ICD-10-CM | POA: Diagnosis not present

## 2023-09-30 DIAGNOSIS — G459 Transient cerebral ischemic attack, unspecified: Secondary | ICD-10-CM | POA: Diagnosis not present

## 2023-09-30 LAB — CBC WITH DIFFERENTIAL/PLATELET
Abs Immature Granulocytes: 0.21 K/uL — ABNORMAL HIGH (ref 0.00–0.07)
Basophils Absolute: 0.1 K/uL (ref 0.0–0.1)
Basophils Relative: 0 %
Eosinophils Absolute: 0 K/uL (ref 0.0–0.5)
Eosinophils Relative: 0 %
HCT: 39.9 % (ref 36.0–46.0)
Hemoglobin: 13 g/dL (ref 12.0–15.0)
Immature Granulocytes: 1 %
Lymphocytes Relative: 10 %
Lymphs Abs: 2.2 K/uL (ref 0.7–4.0)
MCH: 30.3 pg (ref 26.0–34.0)
MCHC: 32.6 g/dL (ref 30.0–36.0)
MCV: 93 fL (ref 80.0–100.0)
Monocytes Absolute: 0.8 K/uL (ref 0.1–1.0)
Monocytes Relative: 4 %
Neutro Abs: 18.2 K/uL — ABNORMAL HIGH (ref 1.7–7.7)
Neutrophils Relative %: 85 %
Platelets: 193 K/uL (ref 150–400)
RBC: 4.29 MIL/uL (ref 3.87–5.11)
RDW: 15.1 % (ref 11.5–15.5)
WBC: 21.5 K/uL — ABNORMAL HIGH (ref 4.0–10.5)
nRBC: 0 % (ref 0.0–0.2)

## 2023-09-30 LAB — BASIC METABOLIC PANEL WITH GFR
Anion gap: 10 (ref 5–15)
BUN: 20 mg/dL (ref 8–23)
CO2: 20 mmol/L — ABNORMAL LOW (ref 22–32)
Calcium: 9.3 mg/dL (ref 8.9–10.3)
Chloride: 108 mmol/L (ref 98–111)
Creatinine, Ser: 1.08 mg/dL — ABNORMAL HIGH (ref 0.44–1.00)
GFR, Estimated: 53 mL/min — ABNORMAL LOW (ref >60)
Glucose, Bld: 134 mg/dL — ABNORMAL HIGH (ref 70–99)
Potassium: 3.7 mmol/L (ref 3.5–5.1)
Sodium: 138 mmol/L (ref 135–145)

## 2023-09-30 LAB — LIPID PANEL
Cholesterol: 172 mg/dL (ref 0–200)
HDL: 86 mg/dL (ref >40)
LDL Cholesterol: 76 mg/dL (ref 0–99)
Total CHOL/HDL Ratio: 2 ratio
Triglycerides: 48 mg/dL (ref <150)
VLDL: 10 mg/dL (ref 0–40)

## 2023-09-30 LAB — HEMOGLOBIN A1C
Hgb A1c MFr Bld: 5.4 % (ref 4.8–5.6)
Mean Plasma Glucose: 108.28 mg/dL

## 2023-09-30 MED ORDER — CLOPIDOGREL BISULFATE 75 MG PO TABS
75.0000 mg | ORAL_TABLET | Freq: Every day | ORAL | 0 refills | Status: DC
  Filled 2023-09-30: qty 90, 90d supply, fill #0

## 2023-09-30 MED ORDER — ASPIRIN 81 MG PO TBEC
81.0000 mg | DELAYED_RELEASE_TABLET | Freq: Every day | ORAL | 0 refills | Status: AC
  Filled 2023-09-30: qty 20, 20d supply, fill #0

## 2023-09-30 MED ORDER — ROSUVASTATIN CALCIUM 10 MG PO TABS
10.0000 mg | ORAL_TABLET | Freq: Every day | ORAL | 0 refills | Status: DC
  Filled 2023-09-30: qty 30, 30d supply, fill #0

## 2023-09-30 NOTE — Progress Notes (Signed)
 Patient refused vitals.

## 2023-09-30 NOTE — Discharge Summary (Addendum)
 Physician Discharge Summary  Michelle Hodges FMW:[id number] DOB: [birth year] DOA: 09/29/2023  PCP: Caro Harlene POUR, NP  Admit date: 09/29/2023 Discharge date: 09/30/2023    Admitted From: Home Disposition: Home  Recommendations for Outpatient Follow-up:  Follow up with PCP in 1-2 weeks Please obtain BMP/CBC in one week Follow-up with neurology in 1 month Please follow up with your PCP on the following pending results: Unresulted Labs (From admission, onward)     Start     Ordered   09/29/23 1623  Urinalysis, w/ Reflex to Culture (Infection Suspected) -Urine, Clean Catch  (Urine Labs)  Once,   R       Question:  Specimen Source  Answer:  Urine, Clean Catch   09/29/23 1622              Home Health: Yes Equipment/Devices: None  Discharge Condition: Stable CODE STATUS: Full code Diet recommendation:  Diet Order             Diet Heart Room service appropriate? Yes; Fluid consistency: Thin  Diet effective now                 Due to brief hospitalization and better understanding of the hospitalization course, I have copied admitting hospitalist HPI and ED course as below.  HPI: Michelle Hodges is a 78 y.o. female with medical history significant of hypertension, MS, senile dementia, presents with disorientation and weakness. She is accompanied by her daughter.   This morning, she experienced disorientation and weakness, prompting her daughter to seek medical attention. She felt both hot and cold, and her hands were shaking, particularly the left hand. She was unable to answer questions and appeared disoriented, although she recognized her daughter.   She has a history of multiple sclerosis, which typically affects her right side more than her left. This morning, she was unable to walk, which is a change from her baseline where she can usually get around with a rollator or sometimes walk independently. Her daughter noted that she was unable to get up this morning, which  was unusual.   She underwent a CT scan and MRI, which were reviewed. She underwent a CT scan and MRI, and her family was told that the MRI was okay and showed chronic changes related to her MS.   She reported a headache earlier while at the hospital, but it has since resolved. No chest pain or discomfort at the time of the conversation.   She has been experiencing cramping, particularly in the right hand, but has not had any difficulties with swallowing. She has been active despite her MS, engaging in exercise, although she has not overexerted herself recently.   In the emergency department patient was seen as a code stroke.  CT scan of the head did not reveal any acute intercranial abnormality.  CT angiogram of the head and neck did not reveal any large vessel occlusion.  Patient was not a candidate for thrombolytics due to as symptoms are resolving.  Labs significant for WBC 12.1 and glucose 146.  Neurology recommending admission for completion of stroke workup.  Patient had been given Ativan  1 mg p.o., Plavix  300 mg p.o., and aspirin  324 mg p.o.  Subjective: Patient seen and examined earlier, 2 daughters were at the bedside.  Patient mentioned that she is feeling much better and back to her baseline.  Her weakness that she had yesterday has completely resolved.  She had no problem with vision, swallowing or speech.  She is  happy about that and was looking forward to go home.  Brief/Interim Summary: Patient was admitted to the hospital for further workup for TIA/stroke.  MRI brain did not show any acute ischemic stroke.  Patient symptoms have resolved and she is back to baseline.  When seen by neurology in the ED, she was loaded with aspirin  and Plavix .  She has been assessed by PT OT who recommended home health.  Neurology has also cleared her for discharge however they recommend continuing DAPT for 21 days total and then continue Plavix  alone but discontinue aspirin  since she was already on aspirin   PTA.  No other changes to medications.  Of note, patient has leukocytosis up to 21 today but patient has remained afebrile and she has no other signs or symptoms indicative of infection.  Patient denied burning urination or increased frequency of urination either.  She was never given any antibiotics and she has improved which argues against UTI as well.  Also, patient's LDL was 76.  Neurology recommended Crestor  10 mg p.o. daily.  Discharge plan was discussed with patient and/or family member/2 daughters and they verbalized understanding and agreed with it.  Discharge Diagnoses:  Principal Problem:   TIA (transient ischemic attack) Active Problems:   Leukocytosis   Essential hypertension   Multiple sclerosis (HCC)    Discharge Instructions   Allergies as of 09/30/2023   No Known Allergies      Medication List     STOP taking these medications    Biotin 10000 MCG Tabs   fish oil-omega-3 fatty acids 1000 MG capsule   OSCILLOCOCCINUM PO       TAKE these medications    acetaminophen  325 MG tablet Commonly known as: TYLENOL  Take 650 mg by mouth every 6 (six) hours as needed.   amLODipine  5 MG tablet Commonly known as: NORVASC  TAKE 1 TABLET (5 MG TOTAL) BY MOUTH DAILY.   aspirin  EC 81 MG tablet Take 1 tablet (81 mg total) by mouth daily for 20 days. Swallow whole.   clopidogrel  75 MG tablet Commonly known as: PLAVIX  Take 1 tablet (75 mg total) by mouth daily. Start taking on: October 01, 2023   MAGNESIUM  BISGLYCINATE PO Take 200 mg by mouth at bedtime.   melatonin 5 MG Tabs Take 1-2 tablets by mouth at bedtime.   rosuvastatin  10 MG tablet Commonly known as: Crestor  Take 1 tablet (10 mg total) by mouth daily.   VITAMIN K2-VITAMIN D3 PO Take 1 tablet by mouth daily.        Follow-up Information     [facility] Follow up.   Specialty: Home Health Services Why: [facility] will contact you for the first home visit. Contact information: [address] [place] [address] [phone]         Caro Harlene POUR, NP Follow up in 1 week(s).   Specialty: Geriatric Medicine Contact information: [address] [place] [address] [phone]         [facility] Follow up in 1 month(s).   Contact information: [address]     [address] [place]   [address] [phone]               No Known Allergies  Consultations: Neurology   Procedures/Studies: DG CHEST PORT 1 VIEW Result Date: 09/29/2023 CLINICAL DATA:  Transient ischemic attack. Dementia and multiple sclerosis. EXAM: PORTABLE CHEST 1 VIEW COMPARISON:  09/25/2006 FINDINGS: Atherosclerotic calcification of the aortic arch. The patient is rotated  to the left on today's radiograph, reducing diagnostic sensitivity and specificity. Heart size within normal limits. The lungs appear grossly clear. No blunting of the costophrenic angles. IMPRESSION: 1. No acute findings. 2. Aortic Atherosclerosis (ICD10-I70.0). Electronically Signed   By: Ryan Salvage M.D.   On: 09/29/2023 18:36   ECHOCARDIOGRAM COMPLETE Result Date: 09/29/2023    ECHOCARDIOGRAM REPORT   Patient Name:   Michelle Hodges Date of Exam: 09/29/2023 Medical Rec #:  [id number]        Height:       69.0 in Accession #:    [id number]       Weight:       126.5 lb Date of Birth:  12-16-1945         BSA:          1.701 m Patient Age:    78 years         BP:           107/74 mmHg Patient Gender: F                HR:           75 bpm. Exam Location:  Inpatient Procedure: 2D Echo, Cardiac Doppler, Color Doppler and Saline Contrast Bubble            Study (Both Spectral and Color Flow Doppler were utilized during            procedure). Indications:    TIA G45.9 , Stroke I63.9  History:        Patient has no prior history of Echocardiogram examinations.  Sonographer:    Tinnie Gosling RDCS Referring Phys: [id number] ROCKY JAYSON LIKES IMPRESSIONS  1. Left ventricular ejection  fraction, by estimation, is 65 to 70%. The left ventricle has normal function. The left ventricle has no regional wall motion abnormalities. There is mild concentric left ventricular hypertrophy. Left ventricular diastolic parameters are consistent with Grade I diastolic dysfunction (impaired relaxation).  2. Right ventricular systolic function is normal. The right ventricular size is normal. Tricuspid regurgitation signal is inadequate for assessing PA pressure.  3. Bubble study negative, no evidence for PFO/ASD.  4. The mitral valve is normal in structure. No evidence of mitral valve regurgitation. No evidence of mitral stenosis.  5. The aortic valve is normal in structure. Aortic valve regurgitation is not visualized. No aortic stenosis is present.  6. The inferior vena cava is normal in size with greater than 50% respiratory variability, suggesting right atrial pressure of 3 mmHg. FINDINGS  Left Ventricle: Left ventricular ejection fraction, by estimation, is 65 to 70%. The left ventricle has normal function. The left ventricle has no regional wall motion abnormalities. The left ventricular internal cavity size was normal in size. There is  mild concentric left ventricular hypertrophy. Left ventricular diastolic parameters are consistent with Grade I diastolic dysfunction (impaired relaxation). Right Ventricle: The right ventricular size is normal. No increase in right ventricular wall thickness. Right ventricular systolic function is normal. Tricuspid regurgitation signal is inadequate for assessing PA pressure. Left Atrium: Left atrial size was normal in size. Right Atrium: Right atrial size was normal in size. Pericardium: Trivial pericardial effusion is present. Mitral Valve: The mitral valve is normal in structure. No evidence of mitral valve regurgitation. No evidence of mitral valve stenosis. Tricuspid Valve: The tricuspid valve is normal in structure. Tricuspid valve regurgitation is not demonstrated.  Aortic Valve: The aortic valve is normal in structure. Aortic valve regurgitation is not visualized. No  aortic stenosis is present. Pulmonic Valve: The pulmonic valve was normal in structure. Pulmonic valve regurgitation is not visualized. Aorta: The aortic root is normal in size and structure. Venous: The inferior vena cava is normal in size with greater than 50% respiratory variability, suggesting right atrial pressure of 3 mmHg. IAS/Shunts: Bubble study negative, no evidence for PFO/ASD. Agitated saline contrast was given intravenously to evaluate for intracardiac shunting.  LEFT VENTRICLE PLAX 2D LVIDd:         4.00 cm   Diastology LVIDs:         2.00 cm   LV e' medial:    7.18 cm/s LV PW:         1.00 cm   LV E/e' medial:  8.6 LV IVS:        1.00 cm   LV e' lateral:   9.14 cm/s LVOT diam:     1.90 cm   LV E/e' lateral: 6.8 LV SV:         69 LV SV Index:   41 LVOT Area:     2.84 cm  RIGHT VENTRICLE             IVC RV S prime:     10.00 cm/s  IVC diam: 1.60 cm TAPSE (M-mode): 2.8 cm LEFT ATRIUM           Index        RIGHT ATRIUM          Index LA diam:      2.30 cm 1.35 cm/m   RA Area:     9.44 cm LA Vol (A4C): 46.9 ml 27.58 ml/m  RA Volume:   17.40 ml 10.23 ml/m  AORTIC VALVE LVOT Vmax:   130.00 cm/s LVOT Vmean:  87.300 cm/s LVOT VTI:    0.243 m  AORTA Ao Root diam: 3.60 cm MITRAL VALVE MV Area (PHT): 2.62 cm    SHUNTS MV Decel Time: 289 msec    Systemic VTI:  0.24 m MV E velocity: 62.10 cm/s  Systemic Diam: 1.90 cm MV A velocity: 80.60 cm/s MV E/A ratio:  0.77 Dalton McleanMD Electronically signed by Ezra Kanner Signature Date/Time: 09/29/2023/5:49:36 PM    Final    EEG adult Result Date: 09/29/2023 Shelton Arlin KIDD, MD     09/29/2023 11:01 AM Patient Name: Michelle Hodges MRN: [id number] Epilepsy Attending: Arlin KIDD Shelton Referring Physician/Provider: Judithe Rocky BROCKS, NP Date: 09/29/2023 Duration: 22.42 mins Patient history: 78yo F with right sided weakness, and left gaze. EEG to evaluate for  seizure Level of alertness: Awake, asleep AEDs during EEG study: Ativan  Technical aspects: This EEG study was done with scalp electrodes positioned according to the 10-20 International system of electrode placement. Electrical activity was reviewed with band pass filter of 1-70Hz , sensitivity of 7 uV/mm, display speed of 30mm/sec with a 60Hz  notched filter applied as appropriate. EEG data were recorded continuously and digitally stored.  Video monitoring was available and reviewed as appropriate. Description: The posterior dominant rhythm consists of 8-9 Hz activity of moderate voltage (25-35 uV) seen predominantly in posterior head regions, symmetric and reactive to eye opening and eye closing. Sleep was characterized by vertex waves, sleep spindles (12 to 14 Hz), maximal frontocentral region.  EEG showed intermittent generalized 3-5 hz theta- delta slowing. Hyperventilation and photic stimulation were not performed.   ABNORMALITY - Intermittent slow, generalized IMPRESSION: This study is suggestive of mild diffuse encephalopathy. No seizures or epileptiform discharges were seen throughout the recording. Priyanka O  Shelton   MR BRAIN W WO CONTRAST Result Date: 09/29/2023 CLINICAL DATA:  78 year old female with unexplained altered mental status. Right side weakness. Speech difficulty. Code stroke presentation this morning. History of multiple sclerosis diagnosed in 2014. EXAM: MRI HEAD WITHOUT AND WITH CONTRAST TECHNIQUE: Multiplanar, multiecho pulse sequences of the brain and surrounding structures were obtained without and with intravenous contrast. CONTRAST:  6mL GADAVIST  GADOBUTROL  1 MMOL/ML IV SOLN COMPARISON:  CT head, CTA head and neck this morning. Brain MRI 03/31/2015. FINDINGS: Brain: No restricted diffusion or evidence of acute infarction. Areas of chronic T2 and FLAIR signal abnormality in the brain compatible with chronic demyelinating disease. Chronic white matter and corpus callosum volume loss.  Confluent periventricular and scattered patchy additional cerebral white matter involvement. Chronic right superior frontal gyrus motor strip cortical involvement (series 6, image 28). Chronic dorsal left brainstem and patchy left cerebellar involvement (series 6, images 11 and 5). Some areas of T2 shine through. No abnormal enhancement identified. Compared to 2017, in extent of disease not significantly changed on sagittal FLAIR imaging. No superimposed midline shift, mass effect, evidence of mass lesion, ventriculomegaly, extra-axial collection or acute intracranial hemorrhage. Cervicomedullary junction and pituitary are within normal limits. No chronic cerebral blood products on SWI. Vascular: Major intracranial vascular flow voids are preserved. Following contrast major dural venous sinuses are enhancing and appear to be patent. Skull and upper cervical spine: Visualized bone marrow signal is within normal limits. Grossly negative visible cervical spine, spinal cord. Sinuses/Orbits: Disconjugate gaze. Chronic paranasal sinus disease is stable. Other: Mastoids are clear. Visible internal auditory structures appear normal. Negative visible scalp and face. IMPRESSION: 1. Negative for acute infarct. 2. Advanced chronic demyelinating disease. No substantial progression since 2017, and no active demyelination identified. Electronically Signed   By: VEAR Hurst M.D.   On: 09/29/2023 09:44   CT ANGIO HEAD NECK W WO CM (CODE STROKE) Addendum Date: 09/29/2023 **ADDENDUM #1 THE INCORRECT TEMPLATE WAS USED FOR THE ORIGINAL REPORT.  IT IS CORRECTED BELOW. EXAM: CTA HEAD AND NECK WITH AND WITHOUT 09/29/2023 07:42:00 AM TECHNIQUE: CTA of the head and neck was performed with and without the administration of intravenous contrast. Multiplanar 2D and/or 3D reformatted images are provided for review. Automated exposure control, iterative reconstruction, and/or weight based adjustment of the mA/kV was utilized to reduce the  radiation dose to as low as reasonably achievable. Stenosis of the internal carotid arteries measured using NASCET criteria. COMPARISON: None available CLINICAL HISTORY: Neuro deficit, acute, stroke suspected. Right side weakness. Left sided gaze. FINDINGS: CTA NECK: AORTIC ARCH AND ARCH VESSELS: No dissection or arterial injury. No significant stenosis of the brachiocephalic or subclavian arteries. CERVICAL CAROTID ARTERIES: Minimal atherosclerotic changes are present at the left carotid bifurcation. No dissection, arterial injury, or hemodynamically significant stenosis by NASCET criteria. CERVICAL VERTEBRAL ARTERIES: No dissection, arterial injury, or significant stenosis. LUNGS AND MEDIASTINUM: Unremarkable. SOFT TISSUES: No acute abnormality. BONES: No acute abnormality. CTA HEAD: ANTERIOR CIRCULATION: No significant stenosis of the internal carotid arteries. No significant stenosis of the anterior cerebral arteries. No significant stenosis of the middle cerebral arteries. No aneurysm. POSTERIOR CIRCULATION: No significant stenosis of the posterior cerebral arteries. No significant stenosis of the basilar artery. No significant stenosis of the vertebral arteries. No aneurysm. OTHER: No dural venous sinus thrombosis on this non-dedicated study. The pertinent results were texted to Dr.  via the amion system at 07:46 am. IMPRESSION: 1. No large vessel occlusion, hemodynamically significant stenosis, or aneurysm in the head or neck.  2. Minimal atherosclerotic changes at the left carotid bifurcation. Electronically signed by: Lonni Necessary MD 09/29/2023 08:44 AM EDT RP Workstation: [id number]   Result Date: 09/29/2023 *ORIGINAL REPORT *EXAM: CT NECK AND CHEST WITH AND WITHOUT CONTRAST 09/29/2023 07:42:00 AM TECHNIQUE: CT of the neck and chest was performed with and without the administration of intravenous contrast. Multiplanar reformatted images are provided for review. Automated exposure control, iterative  reconstruction, and/or weight based adjustment of the mA/kV was utilized to reduce the radiation dose to as low as reasonably achievable. Incidental adrenal and/or renal findings do not require follow up imaging. COMPARISON: None available. CLINICAL HISTORY: Neuro deficit, acute, stroke suspected. Right side weakness. Left sided gaze. FINDINGS: NECK: PHARYNX: The nasopharynx, oropharynx, and hypopharynx are unremarkable. No pharyngeal mucosal based mass lesions. LARYNX: The larynx is unremarkable. Normal epiglottis. RETROPHARYNGEAL SPACE: No retropharyngeal soft tissue swelling or gas. SALIVARY GLANDS: The parotid and submandibular glands are unremarkable. LYMPH NODES: No lymphadenopathy. THYROID: The thyroid gland is unremarkable. No nodule. LIMITED BRAIN, ORBITS AND SINUSES: The visualized portion of the intracranial contents demonstrate no acute abnormality. The visualized portion of the orbits are without acute abnormality. The visualized paranasal sinuses and mastoid air cells demonstrate no acute abnormality. BONES: No acute osseous abnormality. CHEST: MEDIASTINUM: Heart and pericardium are unremarkable. The central airways are clear. LYMPH NODES: No mediastinal, hilar or axillary lymphadenopathy. LUNGS AND PLEURA: No focal consolidation or pulmonary edema. No pleural effusion or pneumothorax. SOFT TISSUES AND BONES: No acute abnormality of the bones or soft tissues. UPPER ABDOMEN: Limited images of the upper abdomen demonstrates no acute abnormality. VASCULATURE: Minimal atherosclerotic changes are present at the left carotid bifurcation. IMPRESSION: 1. No acute findings within the soft tissues of the neck. 2. No acute abnormality of the chest. 3. Minimal atherosclerotic changes at the left carotid bifurcation. The pertinent results were texted to Dr. Matthews via the [facility] system at 7:46am. Electronically signed by: Lonni Necessary MD 09/29/2023 08:02 AM EDT RP Workstation: [id number]   CT HEAD CODE  STROKE WO CONTRAST Result Date: 09/29/2023 EXAM: CT HEAD WITHOUT CONTRAST 09/29/2023 07:34:00 AM TECHNIQUE: CT of the head was performed without the administration of intravenous contrast. Automated exposure control, iterative reconstruction, and/or weight based adjustment of the mA/kV was utilized to reduce the radiation dose to as low as reasonably achievable. COMPARISON: MRN head without contrast 03/31/2015. CLINICAL HISTORY: Neuro deficit, acute, stroke suspected. LKW 2300; Left gaze. FINDINGS: BRAIN AND VENTRICLES: No acute hemorrhage. Gray-white differentiation is preserved. No hydrocephalus. No extra-axial collection. No mass effect or midline shift. Progressive generalized atrophy and white matter disease is noted. [place] stroke program early CT (ASPECT) score: Ganglionic (caudate, IC, lentiform nucleus, insula, M1-M3): 7 Supraganglionic (M4-M6): 3 Total: 10 ORBITS: No acute abnormality. SINUSES: A polyp or mucous retention cyst is present in the left maxillary sinus. Mild circumferential mucosal thickening is present in the left maxillary sinus. The posterior left nasal cavity is opacified with what appears to be a posterior polyp. Nasal sinuses and mastoid air cells are otherwise clear. SOFT TISSUES AND SKULL: No acute soft tissue abnormality. No skull fracture. IMPRESSION: 1. No acute intracranial abnormality. 2. ASPECTS 10/10 3. Progressive generalized atrophy and white matter disease. The pertinent results were texted to Dr. Matthews via the [facility] system at 07:46 am. Electronically signed by: Lonni Necessary MD 09/29/2023 07:49 AM EDT RP Workstation: [id number]     Discharge Exam: Vitals:   09/30/23 0810 09/30/23 1153  BP: 113/85 122/82  Pulse: 89 79  Resp: 18 16  Temp: 98 F (36.7 C) 97.8 F (36.6 C)  SpO2: 98% 100%   Vitals:   09/29/23 1739 09/29/23 2022 09/30/23 0810 09/30/23 1153  BP: 105/65 113/77 113/85 122/82  Pulse: 76 82 89 79  Resp: 16 18 18 16   Temp:   98 F (36.7 C)  97.8 F (36.6 C)  TempSrc: Oral Oral Oral Oral  SpO2: 98% 99% 98% 100%  Weight:        General: Pt is alert, awake, not in acute distress Cardiovascular: RRR, S1/S2 +, no rubs, no gallops Respiratory: CTA bilaterally, no wheezing, no rhonchi Abdominal: Soft, NT, ND, bowel sounds + Extremities: no edema, no cyanosis    The results of significant diagnostics from this hospitalization (including imaging, microbiology, ancillary and laboratory) are listed below for reference.     Microbiology: No results found for this or any previous visit (from the past 240 hours).   Labs: BNP (last 3 results) No results for input(s): BNP in the last 8760 hours. Basic Metabolic Panel: Recent Labs  Lab 09/29/23 0732 09/29/23 0734 09/30/23 1034  NA 137 139 138  K 3.6 3.6 3.7  CL 102 106 108  CO2 22  --  20*  GLUCOSE 146* 146* 134*  BUN 20 21 20   CREATININE 0.98 1.00 1.08*  CALCIUM  9.7  --  9.3   Liver Function Tests: Recent Labs  Lab 09/29/23 0732  AST 35  ALT 19  ALKPHOS 53  BILITOT 1.3*  PROT 6.9  ALBUMIN 3.5   No results for input(s): LIPASE, AMYLASE in the last 168 hours. No results for input(s): AMMONIA in the last 168 hours. CBC: Recent Labs  Lab 09/29/23 0732 09/29/23 0734 09/30/23 1034  WBC 12.1*  --  21.5*  NEUTROABS 11.1*  --  18.2*  HGB 12.1 13.3 13.0  HCT 36.9 39.0 39.9  MCV 92.0  --  93.0  PLT 202  --  193   Cardiac Enzymes: No results for input(s): CKTOTAL, CKMB, CKMBINDEX, TROPONINI in the last 168 hours. BNP: Invalid input(s): POCBNP CBG: Recent Labs  Lab 09/29/23 0729  GLUCAP 164*   D-Dimer No results for input(s): DDIMER in the last 72 hours. Hgb A1c Recent Labs    09/29/23 1817  HGBA1C 5.4   Lipid Profile Recent Labs    09/30/23 1034  CHOL 172  HDL 86  LDLCALC 76  TRIG 48  CHOLHDL 2.0   Thyroid function studies No results for input(s): TSH, T4TOTAL, T3FREE, THYROIDAB in the last 72  hours.  Invalid input(s): FREET3 Anemia work up No results for input(s): VITAMINB12, FOLATE, FERRITIN, TIBC, IRON, RETICCTPCT in the last 72 hours. Urinalysis    Component Value Date/Time   COLORURINE YELLOW 04/15/2012 1428   APPEARANCEUR CLEAR 04/15/2012 1428   LABSPEC 1.010 04/15/2012 1428   PHURINE 6.0 04/15/2012 1428   GLUCOSEU NEGATIVE 04/15/2012 1428   HGBUR NEGATIVE 04/15/2012 1428   BILIRUBINUR NEGATIVE 04/15/2012 1428   KETONESUR NEGATIVE 04/15/2012 1428   PROTEINUR NEGATIVE 04/15/2012 1428   UROBILINOGEN 0.2 04/15/2012 1428   NITRITE NEGATIVE 04/15/2012 1428   LEUKOCYTESUR TRACE (A) 04/15/2012 1428   Sepsis Labs Recent Labs  Lab 09/29/23 0732 09/30/23 1034  WBC 12.1* 21.5*   Microbiology No results found for this or any previous visit (from the past 240 hours).  FURTHER DISCHARGE INSTRUCTIONS:   Get Medicines reviewed and adjusted: Please take all your medications with you for your next visit with your Primary MD   Laboratory/radiological data: Please request your Primary  MD to go over all hospital tests and procedure/radiological results at the follow up, please ask your Primary MD to get all Hospital records sent to his/her office.   In some cases, they will be blood work, cultures and biopsy results pending at the time of your discharge. Please request that your primary care M.D. goes through all the records of your hospital data and follows up on these results.   Also Note the following: If you experience worsening of your admission symptoms, develop shortness of breath, life threatening emergency, suicidal or homicidal thoughts you must seek medical attention immediately by calling 911 or calling your MD immediately  if symptoms less severe.   You must read complete instructions/literature along with all the possible adverse reactions/side effects for all the Medicines you take and that have been prescribed to you. Take any new Medicines after  you have completely understood and accpet all the possible adverse reactions/side effects.    patient was instructed, not to drive, operate heavy machinery, perform activities at heights, swimming or participation in water activities or provide baby-sitting services while on Pain, Sleep and Anxiety Medications; until their outpatient Physician has advised to do so again. Also recommended to not to take more than prescribed Pain, Sleep and Anxiety Medications.  It is not advisable to combine anxiety, sleep and pain medications without talking with your primary care provider.     Wear Seat belts while driving.   Please note: You were cared for by a hospitalist during your hospital stay. Once you are discharged, your primary care physician will handle any further medical issues. Please note that NO REFILLS for any discharge medications will be authorized once you are discharged, as it is imperative that you return to your primary care physician (or establish a relationship with a primary care physician if you do not have one) for your post hospital discharge needs so that they can reassess your need for medications and monitor your lab values  Time coordinating discharge: Over 30 minutes  SIGNED:   Fredia Skeeter, MD  [facility] 09/30/2023, 3:28 PM *Please note that this is a verbal dictation therefore any spelling or grammatical errors are due to the Dragon Medical One system interpretation. If 7PM-7AM, please contact night-coverage [email or web address]

## 2023-09-30 NOTE — Evaluation (Signed)
 Speech Language Pathology Evaluation Patient Details Name: Michelle Hodges MRN: [id number] DOB: 1945/07/01 Today's Date: 09/30/2023 Time: 9158-9143 SLP Time Calculation (min) (ACUTE ONLY): 15 min  Problem List:  Patient Active Problem List   Diagnosis Date Noted   TIA (transient ischemic attack) 09/29/2023   Leukocytosis 09/29/2023   Essential hypertension 09/29/2023   Low back pain 09/20/2014   Gait disorder 07/25/2013   Headache 05/31/2012   MS (multiple sclerosis) (HCC) 04/22/2012   Dilatation of colon 04/20/2012   Abdominal distention 04/17/2012   Loss of weight 04/17/2012   Demyelinating disease of central nervous system (HCC) 04/16/2012   History of CVA (cerebrovascular accident) 04/16/2012   Weakness of right side of body 04/16/2012   Multiple sclerosis (HCC) 04/16/2012   HYPERCHOLESTEROLEMIA 11/11/2006   PERIPHERAL VASCULAR DISEASE 11/11/2006   INSOMNIA, HX OF 11/11/2006   Past Medical History:  Past Medical History:  Diagnosis Date   History of blood transfusion    Hypertension    MS (multiple sclerosis) (HCC)    Senile dementia (HCC)    Past Surgical History:  Past Surgical History:  Procedure Laterality Date   COLONOSCOPY N/A 04/20/2012   Procedure: COLONOSCOPY;  Surgeon: Gordy CHRISTELLA Starch, MD;  Location: [facility] ENDOSCOPY;  Service: Gastroenterology;  Laterality: N/A;   CYSTECTOMY     in HS   TONSILLECTOMY     HPI:  78 year old female with a history of MS, hypertension, and dementia who presents to the emergency department as a code stroke 09/29/23. RUE weakness and left gaze preference. CT negative for bleed or LVO. MRI negative for acute changes   Assessment / Plan / Recommendation Clinical Impression  Pt assessed with daughter present who confirms pt has dementia and feels she is at her baseline. Pt lives with her oldest daughter who assists with finances and medication management. Pt's comprehension and expressive language were appropriate. She was unable to  recall 3 words independently but 1/3 correct with semantic cue and 1/3 accurate with choice. Pt stated use of call bell to receive assist. Pt was talkative and spoke of situation re: her previous stroke. Do not recommend continued ST as pt has necessary assist at home from daughter. Did suggest pt attempt to assist in med management if able, problem solving with daughter (fully supervising) as pill box is filled out or determining if all meds have been taken. Encourged pt to use calendar. Daughter present verbalized understanding.    SLP Assessment  SLP Recommendation/Assessment: Patient does not need any further Speech Language Pathology Services SLP Visit Diagnosis: Cognitive communication deficit (R41.841)     Assistance Recommended at Discharge     Functional Status Assessment Patient has not had a recent decline in their functional status  Frequency and Duration           SLP Evaluation Cognition  Overall Cognitive Status: History of cognitive impairments - at baseline Arousal/Alertness: Awake/alert Orientation Level: Oriented to person (oriented to year and month but doesn't keep up with day of week or date) Year: 2025 Month: August Memory: Impaired Memory Impairment: Retrieval deficit (0/3 independently, 2/2 semantic cue, 1/2 with choice cue) Awareness:  (not formally assessed however suspect impairments) Problem Solving: Appears intact (basic re: hospital- calling RN with call bell) Safety/Judgment: Impaired       Comprehension  Auditory Comprehension Overall Auditory Comprehension: Appears within functional limits for tasks assessed Commands: Within Functional Limits (2 step) Visual Recognition/Discrimination Discrimination: Not tested Reading Comprehension Reading Status: Not tested    Expression Expression Primary  Mode of Expression: Verbal Verbal Expression Overall Verbal Expression: Appears within functional limits for tasks assessed Initiation: No  impairment Level of Generative/Spontaneous Verbalization: Conversation Repetition:  (NT) Naming: Impairment Divergent:  (named 11 animals in one minute with 15 being target) Pragmatics: No impairment Written Expression Written Expression: Not tested   Oral / Motor  Oral Motor/Sensory Function Overall Oral Motor/Sensory Function: Mild impairment Facial ROM: Reduced left;Suspected CN VII (facial) dysfunction (very minimal) Facial Symmetry: Within Functional Limits Lingual ROM: Within Functional Limits Motor Speech Overall Motor Speech: Appears within functional limits for tasks assessed Respiration: Within functional limits Phonation: Normal Resonance: Within functional limits Articulation: Within functional limitis Intelligibility: Intelligible Motor Planning: Within functional limits Motor Speech Errors: Not applicable            Dustin Olam Bull 09/30/2023, 2:28 PM

## 2023-09-30 NOTE — Care Management Obs Status (Signed)
 MEDICARE OBSERVATION STATUS NOTIFICATION   Patient Details  Name: Michelle Hodges MRN: [id number] Date of Birth: 06-Apr-1945   Medicare Observation Status Notification Given:  Yes Obs letter signed and copy given   Claretta Deed 09/30/2023, 3:15 PM

## 2023-09-30 NOTE — TOC Transition Note (Signed)
 Transition of Care [facility]) - Discharge Note   Patient Details  Name: Michelle Hodges MRN: [id number] Date of Birth: 1945/08/01  Transition of Care [facility]) CM/SW Contact:  Andrez JULIANNA George, RN Phone Number: 09/30/2023, 1:52 PM   Clinical Narrative:     Pt is discharging home with home health through [facility]. Information on the AVS. [facility] will contact her for the first home visit.  No new DME needs.  Family is able to provide transportation and manage her medications. Family transporting home today.  Final next level of care: Home w Home Health Services Barriers to Discharge: No Barriers Identified   Patient Goals and CMS Choice   CMS Medicare.gov Compare Post Acute Care list provided to:: Patient Represenative (must comment) Choice offered to / list presented to : Adult Children      Discharge Placement                       Discharge Plan and Services Additional resources added to the After Visit Summary for                            [facility] Arranged: PT, OT [facility] Agency: [facility] Date [facility] Agency Contacted: 09/30/23   Representative spoke with at [facility] Agency: [place]  Social Drivers of Health (SDOH) Interventions SDOH Screenings   Depression (PHQ2-9): Low Risk  (10/27/2022)  Tobacco Use: Medium Risk (09/29/2023)     Readmission Risk Interventions     No data to display

## 2023-09-30 NOTE — Evaluation (Signed)
 Occupational Therapy Evaluation Patient Details Name: Michelle Hodges MRN: [id number] DOB: [birth year] Today's Date: 09/30/2023   History of Present Illness   78 year old female with a history of MS, hypertension, and dementia who presents to the emergency department as a code stroke 09/29/23. RUE weakness and left gaze preference. CT negative for bleed or LVO. MRI negative for acute changes     Clinical Impressions PTA patient living with her daughter, managing ADLs with intermittent assistance but typically able to manage; reports using rollator for mobility and 2 recent falls.  Admitted for above and limited by problem list below.  She requires up to min assist for ADLs, min assist to contact guard assist for transfers, contact guard for mobility in room using rollator.  Discussed safety and fall prevention, recommendations for hands on assist for ADLs, mobility at dc and pt/daughter in agreement.  Based on performance today, pt will best benefit from continued OT services acutely and after dc at [facility] level to optimize independence and safety with ADLs, IADLs and mobility.      If plan is discharge home, recommend the following:   A little help with walking and/or transfers;A little help with bathing/dressing/bathroom;Supervision due to cognitive status;Direct supervision/assist for financial management;Direct supervision/assist for medications management;Assistance with cooking/housework     Functional Status Assessment   Patient has had a recent decline in their functional status and demonstrates the ability to make significant improvements in function in a reasonable and predictable amount of time.     Equipment Recommendations   None recommended by OT     Recommendations for Other Services         Precautions/Restrictions   Precautions Precautions: Fall Recall of Precautions/Restrictions: Impaired Precaution/Restrictions Comments: 2 recent falls Restrictions Weight  Bearing Restrictions Per Provider Order: No     Mobility Bed Mobility Overal bed mobility: Needs Assistance Bed Mobility: Supine to Sit     Supine to sit: Supervision     General bed mobility comments: supervision for safety    Transfers Overall transfer level: Needs assistance Equipment used: Rollator (4 wheels) Transfers: Sit to/from Stand Sit to Stand: Min assist, Contact guard assist           General transfer comment: CGA from edge of bed, minA from lower surface height of toilet      Balance Overall balance assessment: Needs assistance Sitting-balance support: No upper extremity supported, Feet supported Sitting balance-Leahy Scale: Fair Sitting balance - Comments: dynamically at EOB for LB ADLs with min guard for safety but no physical assist required   Standing balance support: Bilateral upper extremity supported, During functional activity, No upper extremity supported Standing balance-Leahy Scale: Poor Standing balance comment: ADLs with 0 hand support and contact guard, relies on BUE support                           ADL either performed or assessed with clinical judgement   ADL Overall ADL's : Needs assistance/impaired     Grooming: Contact guard assist;Wash/dry hands;Standing           Upper Body Dressing : Contact guard assist;Sitting   Lower Body Dressing: Contact guard assist;Sit to/from stand   Toilet Transfer: Minimal assistance;Contact guard assist;Ambulation;Rolling walker (2 wheels)   Toileting- Clothing Manipulation and Hygiene: Minimal assistance;Sit to/from stand Toileting - Clothing Manipulation Details (indicate cue type and reason): clothing mgmt and balance     Functional mobility during ADLs: Contact guard assist;Rollator (4 wheels);Cueing for  safety       Vision   Vision Assessment?: No apparent visual deficits     Perception         Praxis         Pertinent Vitals/Pain Pain Assessment Pain  Assessment: No/denies pain     Extremity/Trunk Assessment Upper Extremity Assessment Upper Extremity Assessment: Generalized weakness;Right hand dominant;RUE deficits/detail RUE Deficits / Details: grossly weaker than L UE, 3/5 MMT. able to use functionally (pt reports hx of cva?) RUE Coordination: WNL   Lower Extremity Assessment Lower Extremity Assessment: Defer to PT evaluation   Cervical / Trunk Assessment Cervical / Trunk Assessment: Other exceptions Cervical / Trunk Exceptions: scoliosis   Communication Communication Communication: No apparent difficulties   Cognition Arousal: Alert Behavior During Therapy: WFL for tasks assessed/performed Cognition: History of cognitive impairments             OT - Cognition Comments: hx of dementia, follows commands and engages appropriately                 Following commands: Impaired Following commands impaired: Follows one step commands with increased time     Cueing  General Comments   Cueing Techniques: Verbal cues;Gestural cues;Tactile cues  daughter at side and suppportive,  agreeable to hands on assist for ADLs and functional mobility at this time   Exercises     Shoulder Instructions      Home Living Family/patient expects to be discharged to:: Private residence Living Arrangements: Children (daughter) Available Help at Discharge: Family;Available 24 hours/day Type of Home: House Home Access: Level entry     Home Layout: One level     Bathroom Shower/Tub: Chief Strategy Officer: Handicapped height     Home Equipment: Rollator (4 wheels);Shower seat;Grab bars - tub/shower;Other (comment)          Prior Functioning/Environment Prior Level of Function : Needs assist;History of Falls (last six months) (2 recent falls)             Mobility Comments: daughter reports pt rarely needs assistance and typically does not want help. She walks with rollator; can stand from normal height  recliner, but also has an elevated chair she can use ADLs Comments: daughter reports pt typically gets down into tub and gets herself out; she rarely needs assistance    OT Problem List: Decreased strength;Decreased activity tolerance;Impaired balance (sitting and/or standing);Decreased knowledge of use of DME or AE   OT Treatment/Interventions: Self-care/ADL training;DME and/or AE instruction;Therapeutic activities;Patient/family education;Balance training      OT Goals(Current goals can be found in the care plan section)   Acute Rehab OT Goals Patient Stated Goal: home OT Goal Formulation: With patient Time For Goal Achievement: 10/14/23 Potential to Achieve Goals: Good   OT Frequency:  Min 2X/week    Co-evaluation              AM-PAC OT 6 Clicks Daily Activity     Outcome Measure Help from another person eating meals?: A Little Help from another person taking care of personal grooming?: A Little Help from another person toileting, which includes using toliet, bedpan, or urinal?: A Little Help from another person bathing (including washing, rinsing, drying)?: A Little Help from another person to put on and taking off regular upper body clothing?: A Little Help from another person to put on and taking off regular lower body clothing?: A Little 6 Click Score: 18   End of Session Equipment Utilized During Treatment: Gait belt;Rollator (  4 wheels) Nurse Communication: Mobility status;Other (comment) (rec toileting schedule)  Activity Tolerance: Patient tolerated treatment well Patient left: with call bell/phone within reach;in chair;with chair alarm set;Other (comment);with family/visitor present (with PT)  OT Visit Diagnosis: Other abnormalities of gait and mobility (R26.89);Muscle weakness (generalized) (M62.81);History of falling (Z91.81)                Time: 9253-9194 OT Time Calculation (min): 19 min Charges:  OT General Charges $OT Visit: 1 Visit OT  Evaluation $OT Eval Moderate Complexity: 1 Mod  Etta NOVAK, OT Acute Rehabilitation Services Office [phone] Secure Chat Preferred    Etta GORMAN Hope 09/30/2023, 8:47 AM

## 2023-09-30 NOTE — Progress Notes (Signed)
 DISCHARGE NOTE HOME Brynja Marker Styron to be discharged Home per MD order. Discussed prescriptions and follow up appointments with the patient. Prescriptions given to patient; medication list explained in detail. Patient verbalized understanding.  Skin clean, dry and intact without evidence of skin break down, no evidence of skin tears noted. IV catheter discontinued intact. Site without signs and symptoms of complications. Dressing and pressure applied. Pt denies pain at the site currently. No complaints noted.  Patient free of lines, drains, and wounds.   An After Visit Summary (AVS) was printed and given to the patient. Patient escorted via wheelchair, and discharged home via private auto.  Peyton SHAUNNA Pepper, RN

## 2023-09-30 NOTE — Progress Notes (Signed)
 Physical Therapy Treatment Patient Details Name: Michelle Hodges MRN: [id number] DOB: 1945/09/04 Today's Date: 09/30/2023   History of Present Illness 78 year old female with a history of MS, hypertension, and dementia who presents to the emergency department as a code stroke 09/29/23. RUE weakness and left gaze preference. CT negative for bleed or LVO. MRI negative for acute changes    PT Comments  Pt progressing towards her physical therapy goals, with improved performance this date in comparison to evaluation. Pt daughter present and supportive. Pt requiring CGA-minA for functional mobility; not quite yet at baseline. Pt ambulating limited household distances with a Rollator with assist with Rollator features I.e. locking and unlocking. Provided with gait belt and reviewed use with daughter. Also recommended following up with PCP regarding potential orthotist consult for R AFO due to baseline foot drop. Recommend HHPT and 24/7 supervision at d/c.    If plan is discharge home, recommend the following: A little help with walking and/or transfers;A little help with bathing/dressing/bathroom;Assistance with cooking/housework;Direct supervision/assist for medications management;Direct supervision/assist for financial management;Assist for transportation;Supervision due to cognitive status   Can travel by private vehicle        Equipment Recommendations  None recommended by PT    Recommendations for Other Services       Precautions / Restrictions Precautions Precautions: Fall Recall of Precautions/Restrictions: Impaired Precaution/Restrictions Comments: 2 recent falls Restrictions Weight Bearing Restrictions Per Provider Order: No     Mobility  Bed Mobility               General bed mobility comments: Sitting EOB with OT upon arrival    Transfers Overall transfer level: Needs assistance Equipment used: Rollator (4 wheels) Transfers: Sit to/from Stand Sit to Stand: Min  assist, Contact guard assist           General transfer comment: CGA from edge of bed, minA from lower surface height of toilet    Ambulation/Gait Ambulation/Gait assistance: Contact guard assist, +2 safety/equipment Gait Distance (Feet): 80 Feet Assistive device: Rollator (4 wheels) Gait Pattern/deviations: Step-through pattern, Decreased stride length, Decreased dorsiflexion - right, Decreased step length - right Gait velocity: decreased Gait velocity interpretation: <1.8 ft/sec, indicate of risk for recurrent falls   General Gait Details: CGA for safety, increased difficulty navigating smaller spaces, but pace improves out in hallway. Verbal cues for proximity to [employer]             Wheelchair Mobility     Tilt Bed    Modified Rankin (Stroke Patients Only) Modified Rankin (Stroke Patients Only) Pre-Morbid Rankin Score: Moderate disability Modified Rankin: Moderately severe disability     Balance Overall balance assessment: Needs assistance Sitting-balance support: No upper extremity supported, Feet supported Sitting balance-Leahy Scale: Good     Standing balance support: Bilateral upper extremity supported, During functional activity, Reliant on assistive device for balance Standing balance-Leahy Scale: Poor                              Communication Communication Communication: No apparent difficulties  Cognition Arousal: Alert Behavior During Therapy: WFL for tasks assessed/performed   PT - Cognitive impairments: History of cognitive impairments                       PT - Cognition Comments: Pleasant and follows one step commands Following commands: Impaired Following commands impaired: Follows one step commands with increased time  Cueing Cueing Techniques: Verbal cues, Gestural cues, Tactile cues  Exercises      General Comments        Pertinent Vitals/Pain Pain Assessment Pain Assessment: No/denies pain     Home Living Family/patient expects to be discharged to:: (P) Private residence Living Arrangements: (P) Children (daughter) Available Help at Discharge: (P) Family;Available 24 hours/day Type of Home: (P) House Home Access: (P) Level entry       Home Layout: (P) One level Home Equipment: (P) Rollator (4 wheels);Shower seat;Grab bars - tub/shower;Other (comment)      Prior Function            PT Goals (current goals can now be found in the care plan section) Acute Rehab PT Goals Patient Stated Goal: return home PT Goal Formulation: With patient/family Time For Goal Achievement: 10/13/23 Potential to Achieve Goals: Good Progress towards PT goals: Progressing toward goals    Frequency    Min 3X/week      PT Plan      Co-evaluation              AM-PAC PT 6 Clicks Mobility   Outcome Measure  Help needed turning from your back to your side while in a flat bed without using bedrails?: A Little Help needed moving from lying on your back to sitting on the side of a flat bed without using bedrails?: A Little Help needed moving to and from a bed to a chair (including a wheelchair)?: A Little Help needed standing up from a chair using your arms (e.g., wheelchair or bedside chair)?: A Little Help needed to walk in hospital room?: A Little Help needed climbing 3-5 steps with a railing? : A Lot 6 Click Score: 17    End of Session Equipment Utilized During Treatment: Gait belt Activity Tolerance: Patient tolerated treatment well Patient left: with call bell/phone within reach;in chair;with chair alarm set Nurse Communication: Mobility status PT Visit Diagnosis: Other abnormalities of gait and mobility (R26.89);Repeated falls (R29.6);Muscle weakness (generalized) (M62.81)     Time: 9194-9179 PT Time Calculation (min) (ACUTE ONLY): 15 min  Charges:    $Therapeutic Activity: 8-22 mins PT General Charges $$ ACUTE PT VISIT: 1 Visit                      Aleck Hodges, PT, DPT Acute Rehabilitation Services Office [phone]    Michelle Hodges 09/30/2023, 8:30 AM

## 2023-09-30 NOTE — Progress Notes (Addendum)
 STROKE TEAM PROGRESS NOTE   INTERIM HISTORY/SUBJECTIVE Initially shaking, tremors, right sided weakness and left gaze deviation which were transient and appeared to have resolved. States right side is weak typically from a previous stroke.  MRI scan is negative for acute stroke and CT angiogram showed no large vessel stenosis or occlusion.  MRI scan shows extensive periventricular and subcortical white matter changes compatible with chronic small vessel disease versus chronic demyelinating disease DAPT ordered for 3 weeks and then plavix  alone. Follows with Dr. Margaret at [facility].   OBJECTIVE  CBC    Component Value Date/Time   WBC 12.1 (H) 09/29/2023 0732   RBC 4.01 09/29/2023 0732   HGB 13.3 09/29/2023 0734   HCT 39.0 09/29/2023 0734   PLT 202 09/29/2023 0732   MCV 92.0 09/29/2023 0732   MCH 30.2 09/29/2023 0732   MCHC 32.8 09/29/2023 0732   RDW 14.6 09/29/2023 0732   LYMPHSABS 0.6 (L) 09/29/2023 0732   MONOABS 0.2 09/29/2023 0732   EOSABS 0.1 09/29/2023 0732   BASOSABS 0.0 09/29/2023 0732    BMET    Component Value Date/Time   NA 139 09/29/2023 0734   K 3.6 09/29/2023 0734   CL 106 09/29/2023 0734   CO2 22 09/29/2023 0732   GLUCOSE 146 (H) 09/29/2023 0734   GLUCOSE 97 11/18/2005 1113   BUN 21 09/29/2023 0734   CREATININE 1.00 09/29/2023 0734   CREATININE 0.86 12/31/2022 0857   CALCIUM  9.7 09/29/2023 0732   EGFR 70 12/31/2022 0857   GFRNONAA 59 (L) 09/29/2023 0732    IMAGING past 24 hours DG CHEST PORT 1 VIEW Result Date: 09/29/2023 CLINICAL DATA:  Transient ischemic attack. Dementia and multiple sclerosis. EXAM: PORTABLE CHEST 1 VIEW COMPARISON:  09/25/2006 FINDINGS: Atherosclerotic calcification of the aortic arch. The patient is rotated to the left on today's radiograph, reducing diagnostic sensitivity and specificity. Heart size within normal limits. The lungs appear grossly clear. No blunting of the costophrenic angles. IMPRESSION: 1. No acute findings. 2. Aortic  Atherosclerosis (ICD10-I70.0). Electronically Signed   By: Ryan Salvage M.D.   On: 09/29/2023 18:36   ECHOCARDIOGRAM COMPLETE Result Date: 09/29/2023    ECHOCARDIOGRAM REPORT   Patient Name:   Michelle Hodges Date of Exam: 09/29/2023 Medical Rec #:  [id number]        Height:       69.0 in Accession #:    [id number]       Weight:       126.5 lb Date of Birth:  [birth year]         BSA:          1.701 m Patient Age:    78 years         BP:           107/74 mmHg Patient Gender: F                HR:           75 bpm. Exam Location:  Inpatient Procedure: 2D Echo, Cardiac Doppler, Color Doppler and Saline Contrast Bubble            Study (Both Spectral and Color Flow Doppler were utilized during            procedure). Indications:    TIA G45.9 , Stroke I63.9  History:        Patient has no prior history of Echocardiogram examinations.  Sonographer:    Tinnie Gosling RDCS Referring Phys: [id number] ROCKY JAYSON LIKES IMPRESSIONS  1. Left ventricular ejection fraction, by estimation, is 65 to 70%. The left ventricle has normal function. The left ventricle has no regional wall motion abnormalities. There is mild concentric left ventricular hypertrophy. Left ventricular diastolic parameters are consistent with Grade I diastolic dysfunction (impaired relaxation).  2. Right ventricular systolic function is normal. The right ventricular size is normal. Tricuspid regurgitation signal is inadequate for assessing PA pressure.  3. Bubble study negative, no evidence for PFO/ASD.  4. The mitral valve is normal in structure. No evidence of mitral valve regurgitation. No evidence of mitral stenosis.  5. The aortic valve is normal in structure. Aortic valve regurgitation is not visualized. No aortic stenosis is present.  6. The inferior vena cava is normal in size with greater than 50% respiratory variability, suggesting right atrial pressure of 3 mmHg. FINDINGS  Left Ventricle: Left ventricular ejection fraction, by estimation, is 65 to  70%. The left ventricle has normal function. The left ventricle has no regional wall motion abnormalities. The left ventricular internal cavity size was normal in size. There is  mild concentric left ventricular hypertrophy. Left ventricular diastolic parameters are consistent with Grade I diastolic dysfunction (impaired relaxation). Right Ventricle: The right ventricular size is normal. No increase in right ventricular wall thickness. Right ventricular systolic function is normal. Tricuspid regurgitation signal is inadequate for assessing PA pressure. Left Atrium: Left atrial size was normal in size. Right Atrium: Right atrial size was normal in size. Pericardium: Trivial pericardial effusion is present. Mitral Valve: The mitral valve is normal in structure. No evidence of mitral valve regurgitation. No evidence of mitral valve stenosis. Tricuspid Valve: The tricuspid valve is normal in structure. Tricuspid valve regurgitation is not demonstrated. Aortic Valve: The aortic valve is normal in structure. Aortic valve regurgitation is not visualized. No aortic stenosis is present. Pulmonic Valve: The pulmonic valve was normal in structure. Pulmonic valve regurgitation is not visualized. Aorta: The aortic root is normal in size and structure. Venous: The inferior vena cava is normal in size with greater than 50% respiratory variability, suggesting right atrial pressure of 3 mmHg. IAS/Shunts: Bubble study negative, no evidence for PFO/ASD. Agitated saline contrast was given intravenously to evaluate for intracardiac shunting.  LEFT VENTRICLE PLAX 2D LVIDd:         4.00 cm   Diastology LVIDs:         2.00 cm   LV e' medial:    7.18 cm/s LV PW:         1.00 cm   LV E/e' medial:  8.6 LV IVS:        1.00 cm   LV e' lateral:   9.14 cm/s LVOT diam:     1.90 cm   LV E/e' lateral: 6.8 LV SV:         69 LV SV Index:   41 LVOT Area:     2.84 cm  RIGHT VENTRICLE             IVC RV S prime:     10.00 cm/s  IVC diam: 1.60 cm TAPSE  (M-mode): 2.8 cm LEFT ATRIUM           Index        RIGHT ATRIUM          Index LA diam:      2.30 cm 1.35 cm/m   RA Area:     9.44 cm LA Vol (A4C): 46.9 ml 27.58 ml/m  RA Volume:   17.40 ml 10.23 ml/m  AORTIC VALVE LVOT Vmax:   130.00 cm/s LVOT Vmean:  87.300 cm/s LVOT VTI:    0.243 m  AORTA Ao Root diam: 3.60 cm MITRAL VALVE MV Area (PHT): 2.62 cm    SHUNTS MV Decel Time: 289 msec    Systemic VTI:  0.24 m MV E velocity: 62.10 cm/s  Systemic Diam: 1.90 cm MV A velocity: 80.60 cm/s MV E/A ratio:  0.77 Dalton McleanMD Electronically signed by Ezra Kanner Signature Date/Time: 09/29/2023/5:49:36 PM    Final    EEG adult Result Date: 09/29/2023 Shelton Arlin KIDD, MD     09/29/2023 11:01 AM Patient Name: Michelle Hodges MRN: [id number] Epilepsy Attending: Arlin KIDD Shelton Referring Physician/Provider: Judithe Rocky BROCKS, NP Date: 09/29/2023 Duration: 22.42 mins Patient history: 78yo F with right sided weakness, and left gaze. EEG to evaluate for seizure Level of alertness: Awake, asleep AEDs during EEG study: Ativan  Technical aspects: This EEG study was done with scalp electrodes positioned according to the 10-20 International system of electrode placement. Electrical activity was reviewed with band pass filter of 1-70Hz , sensitivity of 7 uV/mm, display speed of 56mm/sec with a 60Hz  notched filter applied as appropriate. EEG data were recorded continuously and digitally stored.  Video monitoring was available and reviewed as appropriate. Description: The posterior dominant rhythm consists of 8-9 Hz activity of moderate voltage (25-35 uV) seen predominantly in posterior head regions, symmetric and reactive to eye opening and eye closing. Sleep was characterized by vertex waves, sleep spindles (12 to 14 Hz), maximal frontocentral region.  EEG showed intermittent generalized 3-5 hz theta- delta slowing. Hyperventilation and photic stimulation were not performed.   ABNORMALITY - Intermittent slow, generalized  IMPRESSION: This study is suggestive of mild diffuse encephalopathy. No seizures or epileptiform discharges were seen throughout the recording. Arlin KIDD Shelton   MR BRAIN W WO CONTRAST Result Date: 09/29/2023 CLINICAL DATA:  78 year old female with unexplained altered mental status. Right side weakness. Speech difficulty. Code stroke presentation this morning. History of multiple sclerosis diagnosed in 2014. EXAM: MRI HEAD WITHOUT AND WITH CONTRAST TECHNIQUE: Multiplanar, multiecho pulse sequences of the brain and surrounding structures were obtained without and with intravenous contrast. CONTRAST:  6mL GADAVIST  GADOBUTROL  1 MMOL/ML IV SOLN COMPARISON:  CT head, CTA head and neck this morning. Brain MRI 03/31/2015. FINDINGS: Brain: No restricted diffusion or evidence of acute infarction. Areas of chronic T2 and FLAIR signal abnormality in the brain compatible with chronic demyelinating disease. Chronic white matter and corpus callosum volume loss. Confluent periventricular and scattered patchy additional cerebral white matter involvement. Chronic right superior frontal gyrus motor strip cortical involvement (series 6, image 28). Chronic dorsal left brainstem and patchy left cerebellar involvement (series 6, images 11 and 5). Some areas of T2 shine through. No abnormal enhancement identified. Compared to 2017, in extent of disease not significantly changed on sagittal FLAIR imaging. No superimposed midline shift, mass effect, evidence of mass lesion, ventriculomegaly, extra-axial collection or acute intracranial hemorrhage. Cervicomedullary junction and pituitary are within normal limits. No chronic cerebral blood products on SWI. Vascular: Major intracranial vascular flow voids are preserved. Following contrast major dural venous sinuses are enhancing and appear to be patent. Skull and upper cervical spine: Visualized bone marrow signal is within normal limits. Grossly negative visible cervical spine, spinal  cord. Sinuses/Orbits: Disconjugate gaze. Chronic paranasal sinus disease is stable. Other: Mastoids are clear. Visible internal auditory structures appear normal. Negative visible scalp and face. IMPRESSION: 1. Negative for acute infarct. 2. Advanced chronic demyelinating disease. No substantial  progression since 2017, and no active demyelination identified. Electronically Signed   By: VEAR Hurst M.D.   On: 09/29/2023 09:44    Vitals:   09/29/23 1700 09/29/23 1739 09/29/23 2022 09/30/23 0810  BP: 124/64 105/65 113/77 113/85  Pulse: 72 76 82 89  Resp: 19 16 18 18   Temp:  98.5 F (36.9 C) (!) 97.5 F (36.4 C) 98 F (36.7 C)  TempSrc:  Oral Oral Oral  SpO2: 98% 98% 99% 98%  Weight:         PHYSICAL EXAM General:  Alert, well-nourished, well-developed patient in no acute distress Psych:  Mood and affect appropriate for situation CV: Regular rate and rhythm on monitor Respiratory:  Regular, unlabored respirations on room air GI: Abdomen soft and nontender   NEURO:  Mental Status: AA&Ox3, patient is able to give clear and coherent history Speech/Language: speech is without dysarthria or aphasia.  Naming, repetition, fluency, and comprehension intact.  Cranial Nerves:  II: PERRL. Visual fields full.  III, IV, VI: EOMI. Eyelids elevate symmetrically.  V: Sensation is intact to light touch and symmetrical to face.  VII: Face facial droop  VIII: hearing intact to voice. IX, X: Palate elevates symmetrically. Phonation is normal.  KP:Dynloizm shrug 5/5. XII: tongue is midline without fasciculations. Motor: RUE hand grasp weak Tone: is increased and bulk is normal Sensation- Diminished sensation on the right  Coordination: Fine finger movement on the right is weak Gait- deferred   ASSESSMENT/PLAN  Michelle Hodges is a 78 y.o. female with history of primary progressive MS, dementia, use of walker at home, HTN who was BIB EMS as an activated CODE STROKE due to acute onset of  right-sided weakness and left gaze.   NIH on Admission 5  Reports previous stroke with residual sensory deficit on the right and spastic right hemiparesis   TIA: Likely small vessel disease Code Stroke CT head No acute abnormality. Small vessel disease. ASPECTS 10.    CTA head & neck No LVO  EEG- This study is suggestive of mild diffuse encephalopathy. No seizures or epileptiform discharges were seen throughout the recording  MRI  Negative for acute infarct. Advanced chronic demyelinating disease. No substantial progression since 2017, and no active demyelination identified. 2D Echo EF 65-70%, bubble study negative  LDL 76 HgbA1c 5.4 EEG negative  VTE prophylaxis -Lovenox  aspirin  81 mg daily prior to admission, now on aspirin  81 mg daily and clopidogrel  75 mg daily for 3 weeks and then plavix  alone. Therapy recommendations:  Home Health PT and Home Health OT Disposition:  Home with home health   Hypertension Home meds:  Norvasc   Stable Blood Pressure Goal: BP less than 220/110   Hyperlipidemia LDL 76, goal < 70 Add crestor  10mg  Continue statin at discharge  Concern for Progressive MS Follows with [facility]  CSF studies (04/19/12): OCB: >5 well defined gamma restriction bands that are not present in the patient's corresponding serum sample MBP < 2 WBC 4, RBC 990, Glucose 88, Protein 24 Cultures negative UDS - positive THC JCV antibody - 0.35 (H) ANA - negative ACE - 51 pANCA, cANCA - normal NMO-IgG - < 1.6 (normal)  Dementia  Follows with [facility]  Hospital day # 0  Patient seen and examined by NP/APP with MD. MD to update note as needed.   Jorene Last, DNP, FNP-BC [facility] Pager: [phone]  I have personally obtained history,examined this patient, reviewed notes, independently viewed imaging studies, participated in medical decision making and plan  of care.ROS completed by me personally and pertinent positives fully documented  I have made any additions  or clarifications directly to the above note. Agree with note above.  Patient presented with sudden onset of left gaze deviation and right-sided weakness which appears to have resolved.  She does have some residual mild right-sided weakness from her previous stroke.  She has an abnormal MRI showing extensive white matter changes with possible diagnosis of multiple sclerosis though she has not been on any treatment.  Recommend aspirin  and Plavix  for 3 weeks followed by Plavix  alone and aggressive risk factor modification.  Mobilize out of bed.  Therapy consults.  Follow-up as an outpatient with Dr. Margaret at [facility] whom she has seen in the past.  Discussed with Dr. Royal   I personally spent a total of 50 minutes in the care of the patient today including getting/reviewing separately obtained history, performing a medically appropriate exam/evaluation, counseling and educating, placing orders, referring and communicating with other health care professionals, documenting clinical information in the EHR, independently interpreting results, and coordinating care.        Eather Popp, MD Medical Director [facility] Pager: [phone] 09/30/2023 3:30 PM   To contact Stroke Continuity provider, please refer to [email or web address]. After hours, contact General Neurology

## 2023-10-01 ENCOUNTER — Other Ambulatory Visit (HOSPITAL_COMMUNITY): Payer: Self-pay

## 2023-10-06 ENCOUNTER — Telehealth: Payer: Self-pay

## 2023-10-06 NOTE — Telephone Encounter (Signed)
 Copied from CRM [id number]. Topic: Clinical - Home Health Verbal Orders >> Oct 06, 2023 12:16 PM Miquel SAILOR wrote: Caller/Agency: Maria[facility]/ [phone] is returning [place] call . Transferred to  Heather Ip from [place] call back to request for Home Health Verbal Orders for a Social Worker for the patient to be evaluated in the home.  Message sent to Caro Harlene POUR, NP

## 2023-10-06 NOTE — Telephone Encounter (Signed)
 Okay for SW consult.

## 2023-10-06 NOTE — Telephone Encounter (Signed)
 Copied from CRM [id number]. Topic: Clinical - Home Health Verbal Orders >> Oct 06, 2023  9:40 AM Cherylann RAMAN wrote: Caller/Agency: [facility]  Callback Number: [phone] VM confidential may leave detailed messages Service Requested: Physical Therapy Frequency: 1 week 2 2 week 2 1 week 5 Any new concerns about the patient? No   Was able to the leave a detail message for Michelle Hodges from [facility] in regards to given permission to the okay for the Home Health Verbal Orders for Physical Therapy through our [facility] policy.

## 2023-10-06 NOTE — Telephone Encounter (Signed)
 Verbal orders have been given [facility] policy

## 2023-10-08 ENCOUNTER — Other Ambulatory Visit: Payer: Self-pay | Admitting: Nurse Practitioner

## 2023-10-08 DIAGNOSIS — Z1231 Encounter for screening mammogram for malignant neoplasm of breast: Secondary | ICD-10-CM

## 2023-10-16 ENCOUNTER — Ambulatory Visit
Admission: RE | Admit: 2023-10-16 | Discharge: 2023-10-16 | Disposition: A | Discharge status: Home / Self Care | Source: Ambulatory Visit | Attending: Nurse Practitioner | Admitting: Nurse Practitioner

## 2023-10-16 DIAGNOSIS — Z1231 Encounter for screening mammogram for malignant neoplasm of breast: Secondary | ICD-10-CM

## 2023-10-27 ENCOUNTER — Inpatient Hospital Stay (HOSPITAL_COMMUNITY): Admission: EM | Admit: 2023-10-27 | Discharge: 2023-10-29 | DRG: 871 | Disposition: A | Discharge status: Home Health

## 2023-10-27 ENCOUNTER — Encounter (HOSPITAL_COMMUNITY): Payer: Self-pay

## 2023-10-27 ENCOUNTER — Emergency Department (HOSPITAL_COMMUNITY)

## 2023-10-27 ENCOUNTER — Other Ambulatory Visit: Payer: Self-pay

## 2023-10-27 DIAGNOSIS — Z803 Family history of malignant neoplasm of breast: Secondary | ICD-10-CM

## 2023-10-27 DIAGNOSIS — G35 Multiple sclerosis: Secondary | ICD-10-CM | POA: Diagnosis present

## 2023-10-27 DIAGNOSIS — Z79899 Other long term (current) drug therapy: Secondary | ICD-10-CM | POA: Diagnosis not present

## 2023-10-27 DIAGNOSIS — Z87891 Personal history of nicotine dependence: Secondary | ICD-10-CM

## 2023-10-27 DIAGNOSIS — R0682 Tachypnea, not elsewhere classified: Secondary | ICD-10-CM | POA: Diagnosis present

## 2023-10-27 DIAGNOSIS — Z1152 Encounter for screening for COVID-19: Secondary | ICD-10-CM

## 2023-10-27 DIAGNOSIS — A4151 Sepsis due to Escherichia coli [E. coli]: Secondary | ICD-10-CM | POA: Diagnosis present

## 2023-10-27 DIAGNOSIS — R202 Paresthesia of skin: Secondary | ICD-10-CM | POA: Diagnosis present

## 2023-10-27 DIAGNOSIS — R6520 Severe sepsis without septic shock: Secondary | ICD-10-CM | POA: Diagnosis present

## 2023-10-27 DIAGNOSIS — G9341 Metabolic encephalopathy: Secondary | ICD-10-CM | POA: Diagnosis present

## 2023-10-27 DIAGNOSIS — I10 Essential (primary) hypertension: Secondary | ICD-10-CM | POA: Diagnosis present

## 2023-10-27 DIAGNOSIS — N3090 Cystitis, unspecified without hematuria: Secondary | ICD-10-CM

## 2023-10-27 DIAGNOSIS — Z7902 Long term (current) use of antithrombotics/antiplatelets: Secondary | ICD-10-CM

## 2023-10-27 DIAGNOSIS — Z681 Body mass index (BMI) 19 or less, adult: Secondary | ICD-10-CM

## 2023-10-27 DIAGNOSIS — D649 Anemia, unspecified: Secondary | ICD-10-CM | POA: Diagnosis present

## 2023-10-27 DIAGNOSIS — I69351 Hemiplegia and hemiparesis following cerebral infarction affecting right dominant side: Secondary | ICD-10-CM

## 2023-10-27 DIAGNOSIS — R4781 Slurred speech: Secondary | ICD-10-CM | POA: Diagnosis present

## 2023-10-27 DIAGNOSIS — F0390 Unspecified dementia without behavioral disturbance: Secondary | ICD-10-CM | POA: Diagnosis present

## 2023-10-27 DIAGNOSIS — Z820 Family history of epilepsy and other diseases of the nervous system: Secondary | ICD-10-CM

## 2023-10-27 DIAGNOSIS — R252 Cramp and spasm: Secondary | ICD-10-CM | POA: Diagnosis present

## 2023-10-27 DIAGNOSIS — Z993 Dependence on wheelchair: Secondary | ICD-10-CM

## 2023-10-27 DIAGNOSIS — A419 Sepsis, unspecified organism: Secondary | ICD-10-CM | POA: Diagnosis not present

## 2023-10-27 DIAGNOSIS — N3001 Acute cystitis with hematuria: Secondary | ICD-10-CM | POA: Diagnosis present

## 2023-10-27 DIAGNOSIS — Z8249 Family history of ischemic heart disease and other diseases of the circulatory system: Secondary | ICD-10-CM

## 2023-10-27 DIAGNOSIS — Z823 Family history of stroke: Secondary | ICD-10-CM | POA: Diagnosis not present

## 2023-10-27 DIAGNOSIS — R410 Disorientation, unspecified: Secondary | ICD-10-CM | POA: Diagnosis present

## 2023-10-27 DIAGNOSIS — B9620 Unspecified Escherichia coli [E. coli] as the cause of diseases classified elsewhere: Secondary | ICD-10-CM | POA: Diagnosis not present

## 2023-10-27 DIAGNOSIS — Z8673 Personal history of transient ischemic attack (TIA), and cerebral infarction without residual deficits: Secondary | ICD-10-CM

## 2023-10-27 LAB — I-STAT CHEM 8, ED
BUN: 15 mg/dL (ref 8–23)
Calcium, Ion: 1.1 mmol/L — ABNORMAL LOW (ref 1.15–1.40)
Chloride: 108 mmol/L (ref 98–111)
Creatinine, Ser: 1 mg/dL (ref 0.44–1.00)
Glucose, Bld: 135 mg/dL — ABNORMAL HIGH (ref 70–99)
HCT: 33 % — ABNORMAL LOW (ref 36.0–46.0)
Hemoglobin: 11.2 g/dL — ABNORMAL LOW (ref 12.0–15.0)
Potassium: 3.9 mmol/L (ref 3.5–5.1)
Sodium: 141 mmol/L (ref 135–145)
TCO2: 23 mmol/L (ref 22–32)

## 2023-10-27 LAB — COMPREHENSIVE METABOLIC PANEL WITH GFR
ALT: 17 U/L (ref 0–44)
AST: 27 U/L (ref 15–41)
Albumin: 3.2 g/dL — ABNORMAL LOW (ref 3.5–5.0)
Alkaline Phosphatase: 77 U/L (ref 38–126)
Anion gap: 12 (ref 5–15)
BUN: 13 mg/dL (ref 8–23)
CO2: 22 mmol/L (ref 22–32)
Calcium: 9.4 mg/dL (ref 8.9–10.3)
Chloride: 105 mmol/L (ref 98–111)
Creatinine, Ser: 0.95 mg/dL (ref 0.44–1.00)
GFR, Estimated: >60 mL/min (ref >60)
Glucose, Bld: 132 mg/dL — ABNORMAL HIGH (ref 70–99)
Potassium: 3.9 mmol/L (ref 3.5–5.1)
Sodium: 139 mmol/L (ref 135–145)
Total Bilirubin: 0.7 mg/dL (ref 0.0–1.2)
Total Protein: 5.8 g/dL — ABNORMAL LOW (ref 6.5–8.1)

## 2023-10-27 LAB — BASIC METABOLIC PANEL WITH GFR
Anion gap: 9 (ref 5–15)
BUN: 12 mg/dL (ref 8–23)
CO2: 23 mmol/L (ref 22–32)
Calcium: 8.7 mg/dL — ABNORMAL LOW (ref 8.9–10.3)
Chloride: 108 mmol/L (ref 98–111)
Creatinine, Ser: 0.89 mg/dL (ref 0.44–1.00)
GFR, Estimated: >60 mL/min (ref >60)
Glucose, Bld: 121 mg/dL — ABNORMAL HIGH (ref 70–99)
Potassium: 3.6 mmol/L (ref 3.5–5.1)
Sodium: 140 mmol/L (ref 135–145)

## 2023-10-27 LAB — PROTIME-INR
INR: 1 (ref 0.8–1.2)
Prothrombin Time: 13.8 s (ref 11.4–15.2)

## 2023-10-27 LAB — URINALYSIS, W/ REFLEX TO CULTURE (INFECTION SUSPECTED)
Bilirubin Urine: NEGATIVE
Glucose, UA: NEGATIVE mg/dL
Ketones, ur: NEGATIVE mg/dL
Nitrite: POSITIVE — AB
Protein, ur: 30 mg/dL — AB
Specific Gravity, Urine: 1.008 (ref 1.005–1.030)
pH: 7 (ref 5.0–8.0)

## 2023-10-27 LAB — ETHANOL: Alcohol, Ethyl (B): <15 mg/dL (ref <15)

## 2023-10-27 LAB — DIFFERENTIAL
Abs Immature Granulocytes: 0.11 K/uL — ABNORMAL HIGH (ref 0.00–0.07)
Basophils Absolute: 0 K/uL (ref 0.0–0.1)
Basophils Relative: 0 %
Eosinophils Absolute: 0.1 K/uL (ref 0.0–0.5)
Eosinophils Relative: 1 %
Immature Granulocytes: 1 %
Lymphocytes Relative: 6 %
Lymphs Abs: 1 K/uL (ref 0.7–4.0)
Monocytes Absolute: 0.8 K/uL (ref 0.1–1.0)
Monocytes Relative: 5 %
Neutro Abs: 14.1 K/uL — ABNORMAL HIGH (ref 1.7–7.7)
Neutrophils Relative %: 87 %

## 2023-10-27 LAB — CBC WITH DIFFERENTIAL/PLATELET
Abs Immature Granulocytes: 0.09 K/uL — ABNORMAL HIGH (ref 0.00–0.07)
Basophils Absolute: 0 K/uL (ref 0.0–0.1)
Basophils Relative: 0 %
Eosinophils Absolute: 0 K/uL (ref 0.0–0.5)
Eosinophils Relative: 0 %
HCT: 30.5 % — ABNORMAL LOW (ref 36.0–46.0)
Hemoglobin: 9.8 g/dL — ABNORMAL LOW (ref 12.0–15.0)
Immature Granulocytes: 1 %
Lymphocytes Relative: 9 %
Lymphs Abs: 1.4 K/uL (ref 0.7–4.0)
MCH: 29.3 pg (ref 26.0–34.0)
MCHC: 32.1 g/dL (ref 30.0–36.0)
MCV: 91.3 fL (ref 80.0–100.0)
Monocytes Absolute: 0.9 K/uL (ref 0.1–1.0)
Monocytes Relative: 6 %
Neutro Abs: 13.5 K/uL — ABNORMAL HIGH (ref 1.7–7.7)
Neutrophils Relative %: 84 %
Platelets: 275 K/uL (ref 150–400)
RBC: 3.34 MIL/uL — ABNORMAL LOW (ref 3.87–5.11)
RDW: 13.9 % (ref 11.5–15.5)
WBC: 15.9 K/uL — ABNORMAL HIGH (ref 4.0–10.5)
nRBC: 0 % (ref 0.0–0.2)

## 2023-10-27 LAB — CBC
HCT: 34.3 % — ABNORMAL LOW (ref 36.0–46.0)
Hemoglobin: 10.9 g/dL — ABNORMAL LOW (ref 12.0–15.0)
MCH: 29.6 pg (ref 26.0–34.0)
MCHC: 31.8 g/dL (ref 30.0–36.0)
MCV: 93.2 fL (ref 80.0–100.0)
Platelets: 283 K/uL (ref 150–400)
RBC: 3.68 MIL/uL — ABNORMAL LOW (ref 3.87–5.11)
RDW: 13.7 % (ref 11.5–15.5)
WBC: 16.2 K/uL — ABNORMAL HIGH (ref 4.0–10.5)
nRBC: 0 % (ref 0.0–0.2)

## 2023-10-27 LAB — I-STAT CG4 LACTIC ACID, ED
Lactic Acid, Venous: 0.7 mmol/L (ref 0.5–1.9)
Lactic Acid, Venous: 2 mmol/L (ref 0.5–1.9)

## 2023-10-27 LAB — RAPID URINE DRUG SCREEN, HOSP PERFORMED
Amphetamines: NOT DETECTED
Barbiturates: NOT DETECTED
Benzodiazepines: NOT DETECTED
Cocaine: NOT DETECTED
Opiates: NOT DETECTED
Tetrahydrocannabinol: NOT DETECTED

## 2023-10-27 LAB — APTT: aPTT: 28 s (ref 24–36)

## 2023-10-27 LAB — RESP PANEL BY RT-PCR (RSV, FLU A&B, COVID)  RVPGX2
Influenza A by PCR: NEGATIVE
Influenza B by PCR: NEGATIVE
Resp Syncytial Virus by PCR: NEGATIVE
SARS Coronavirus 2 by RT PCR: NEGATIVE

## 2023-10-27 LAB — MAGNESIUM: Magnesium: 2 mg/dL (ref 1.7–2.4)

## 2023-10-27 LAB — CBG MONITORING, ED: Glucose-Capillary: 146 mg/dL — ABNORMAL HIGH (ref 70–99)

## 2023-10-27 MED ORDER — SODIUM CHLORIDE 0.9 % IV SOLN
1.0000 g | INTRAVENOUS | Status: DC
  Administered 2023-10-28 – 2023-10-29 (×2): 1 g via INTRAVENOUS
  Filled 2023-10-27 (×2): qty 10

## 2023-10-27 MED ORDER — CLOPIDOGREL BISULFATE 75 MG PO TABS
75.0000 mg | ORAL_TABLET | Freq: Every day | ORAL | Status: DC
  Administered 2023-10-27 – 2023-10-29 (×3): 75 mg via ORAL
  Filled 2023-10-27 (×3): qty 1

## 2023-10-27 MED ORDER — HYDRALAZINE HCL 20 MG/ML IJ SOLN: 5.0000 mg | Freq: Four times a day (QID) | INTRAMUSCULAR | Status: DC | PRN

## 2023-10-27 MED ORDER — MELATONIN 3 MG PO TABS: 3.0000 mg | ORAL_TABLET | Freq: Every evening | ORAL | Status: DC | PRN

## 2023-10-27 MED ORDER — ONDANSETRON HCL 4 MG/2ML IJ SOLN: 4.0000 mg | Freq: Four times a day (QID) | INTRAMUSCULAR | Status: DC | PRN

## 2023-10-27 MED ORDER — ENOXAPARIN SODIUM 40 MG/0.4ML IJ SOSY: 40.0000 mg | PREFILLED_SYRINGE | INTRAMUSCULAR | Status: DC

## 2023-10-27 MED ORDER — ACETAMINOPHEN 650 MG RE SUPP: 650.0000 mg | Freq: Four times a day (QID) | RECTAL | Status: DC | PRN

## 2023-10-27 MED ORDER — POLYETHYLENE GLYCOL 3350 17 G PO PACK: 17.0000 g | PACK | Freq: Every day | ORAL | Status: DC | PRN

## 2023-10-27 MED ORDER — IOHEXOL 350 MG/ML SOLN
65.0000 mL | Freq: Once | INTRAVENOUS | Status: AC | PRN
  Administered 2023-10-27: 65 mL via INTRAVENOUS

## 2023-10-27 MED ORDER — SODIUM CHLORIDE 0.9 % IV SOLN
1.0000 g | Freq: Once | INTRAVENOUS | Status: AC
  Administered 2023-10-27: 1 g via INTRAVENOUS
  Filled 2023-10-27: qty 10

## 2023-10-27 MED ORDER — ROSUVASTATIN CALCIUM 5 MG PO TABS
10.0000 mg | ORAL_TABLET | Freq: Every day | ORAL | Status: DC
  Administered 2023-10-27 – 2023-10-29 (×3): 10 mg via ORAL
  Filled 2023-10-27 (×3): qty 2

## 2023-10-27 MED ORDER — MELATONIN 5 MG PO TABS
5.0000 mg | ORAL_TABLET | Freq: Every evening | ORAL | Status: DC | PRN
  Administered 2023-10-28: 5 mg via ORAL
  Filled 2023-10-27: qty 1

## 2023-10-27 MED ORDER — SODIUM CHLORIDE 0.9 % IV BOLUS
1000.0000 mL | Freq: Once | INTRAVENOUS | Status: AC
  Administered 2023-10-27: 1000 mL via INTRAVENOUS

## 2023-10-27 MED ORDER — ACETAMINOPHEN 325 MG PO TABS
650.0000 mg | ORAL_TABLET | Freq: Four times a day (QID) | ORAL | Status: DC | PRN
  Administered 2023-10-27: 650 mg via ORAL
  Filled 2023-10-27: qty 2

## 2023-10-27 MED ORDER — LACTATED RINGERS IV SOLN: INTRAVENOUS | Status: AC

## 2023-10-27 MED ORDER — ACETAMINOPHEN 325 MG PO TABS
650.0000 mg | ORAL_TABLET | Freq: Once | ORAL | Status: AC
  Administered 2023-10-27: 650 mg via ORAL
  Filled 2023-10-27: qty 2

## 2023-10-27 NOTE — ED Notes (Addendum)
 Per rapid response team cancel stroke alert/workup

## 2023-10-27 NOTE — ED Provider Notes (Signed)
 [place] EMERGENCY DEPARTMENT AT [facility] Provider Note   CSN: [id number] Arrival date & time: 10/27/23  0010  An emergency department physician performed an initial assessment on this suspected stroke patient at 0016.  Patient presents with: Code Stroke   Michelle Hodges is a 78 y.o. female.   78 year old female brought in by EMS from home, had complained of feeling dizzy all day today. Around 1115pm 10/26/23 daughter helped patient off commode and found right sided weakness with slurred speech and change in mental status- couldn't remember year. Febrile on arrival at 101.5. Report of urinary frequency today.  History of MS, not on meds. Recent TIA, on Plavix .        Prior to Admission medications   Medication Sig Start Date End Date Taking? Authorizing Provider  acetaminophen  (TYLENOL ) 500 MG tablet Take 500 mg by mouth every 6 (six) hours as needed for mild pain (pain score 1-3) or headache.   Yes [provider]  amLODipine  (NORVASC ) 5 MG tablet TAKE 1 TABLET (5 MG TOTAL) BY MOUTH DAILY. 06/16/23  Yes Caro Harlene POUR, NP  clopidogrel  (PLAVIX ) 75 MG tablet Take 1 tablet (75 mg total) by mouth daily. 10/01/23 12/30/23 Yes Pahwani, Fredia, MD  HOMEOPATHIC PRODUCTS EX Apply 1 Application topically as needed (joint and arthritic pain). Mixture of clove oil and jojoba oil, and emollient .   Yes [provider]  MAGNESIUM  BISGLYCINATE PO Take 200 mg by mouth at bedtime.   Yes [provider]  rosuvastatin  (CRESTOR ) 10 MG tablet Take 1 tablet (10 mg total) by mouth daily. 09/30/23 10/30/23 Yes Vernon Fredia, MD  Vitamin D-Vitamin K (VITAMIN K2-VITAMIN D3 PO) Take 1 tablet by mouth daily.   Yes [provider]    Allergies: Patient has no known allergies.    Review of Systems Level 5 caveat for change in mental status.  Updated Vital Signs BP 117/74   Pulse 89   Temp 98.7 F (37.1 C) (Oral)   Resp 17   Ht 5' 9 (1.753 m)   Wt  59 kg   SpO2 98%   BMI 19.20 kg/m   Physical Exam Vitals and nursing note reviewed.  Constitutional:      General: She is not in acute distress.    Appearance: She is well-developed. She is not diaphoretic.  HENT:     Head: Normocephalic and atraumatic.     Mouth/Throat:     Mouth: Mucous membranes are moist.  Eyes:     Extraocular Movements: Extraocular movements intact.     Pupils: Pupils are equal, round, and reactive to light.  Cardiovascular:     Rate and Rhythm: Normal rate and regular rhythm.     Heart sounds: Normal heart sounds.  Pulmonary:     Effort: Pulmonary effort is normal.     Breath sounds: Normal breath sounds.  Abdominal:     Palpations: Abdomen is soft.     Tenderness: There is no abdominal tenderness.  Musculoskeletal:     Right lower leg: No edema.     Left lower leg: No edema.  Skin:    General: Skin is warm and dry.  Neurological:     Mental Status: She is alert.     Comments: Slight right plantar flexion weakness compared to left. Slight confusion while providing history   Psychiatric:        Behavior: Behavior normal.     (all labs ordered are listed, but only abnormal  results are displayed) Labs Reviewed  CBC - Abnormal; Notable for the following components:      Result Value   WBC 16.2 (*)    RBC 3.68 (*)    Hemoglobin 10.9 (*)    HCT 34.3 (*)    All other components within normal limits  DIFFERENTIAL - Abnormal; Notable for the following components:   Neutro Abs 14.1 (*)    Abs Immature Granulocytes 0.11 (*)    All other components within normal limits  COMPREHENSIVE METABOLIC PANEL WITH GFR - Abnormal; Notable for the following components:   Glucose, Bld 132 (*)    Total Protein 5.8 (*)    Albumin 3.2 (*)    All other components within normal limits  URINALYSIS, W/ REFLEX TO CULTURE (INFECTION SUSPECTED) - Abnormal; Notable for the following components:   Hgb urine dipstick MODERATE (*)    Protein, ur 30 (*)    Nitrite POSITIVE  (*)    Leukocytes,Ua TRACE (*)    Bacteria, UA MANY (*)    All other components within normal limits  CBC WITH DIFFERENTIAL/PLATELET - Abnormal; Notable for the following components:   WBC 15.9 (*)    RBC 3.34 (*)    Hemoglobin 9.8 (*)    HCT 30.5 (*)    Neutro Abs 13.5 (*)    Abs Immature Granulocytes 0.09 (*)    All other components within normal limits  BASIC METABOLIC PANEL WITH GFR - Abnormal; Notable for the following components:   Glucose, Bld 121 (*)    Calcium  8.7 (*)    All other components within normal limits  I-STAT CHEM 8, ED - Abnormal; Notable for the following components:   Glucose, Bld 135 (*)    Calcium , Ion 1.10 (*)    Hemoglobin 11.2 (*)    HCT 33.0 (*)    All other components within normal limits  CBG MONITORING, ED - Abnormal; Notable for the following components:   Glucose-Capillary 146 (*)    All other components within normal limits  I-STAT CG4 LACTIC ACID, ED - Abnormal; Notable for the following components:   Lactic Acid, Venous 2.0 (*)    All other components within normal limits  RESP PANEL BY RT-PCR (RSV, FLU A&B, COVID)  RVPGX2  CULTURE, BLOOD (ROUTINE X 2)  CULTURE, BLOOD (ROUTINE X 2)  URINE CULTURE  ETHANOL  PROTIME-INR  APTT  RAPID URINE DRUG SCREEN, HOSP PERFORMED  MAGNESIUM   I-STAT CG4 LACTIC ACID, ED    EKG: None  Radiology: CT ABDOMEN PELVIS W CONTRAST Result Date: 10/27/2023 EXAM: CT ABDOMEN AND PELVIS WITH CONTRAST 10/27/2023 03:44:35 AM TECHNIQUE: CT of the abdomen and pelvis was performed with the administration of intravenous contrast. Multiplanar reformatted images are provided for review. Automated exposure control, iterative reconstruction, and/or weight-based adjustment of the mA/kV was utilized to reduce the radiation dose to as low as reasonably achievable. COMPARISON: 04/17/2012 CLINICAL HISTORY: Abdominal pain, acute, nonlocalized. Bladder infection. FINDINGS: LOWER CHEST: Mild bibasilar atelectasis. LIVER: The liver is  unremarkable. GALLBLADDER AND BILE DUCTS: Gallbladder is unremarkable. No biliary ductal dilatation. SPLEEN: No acute abnormality. PANCREAS: No acute abnormality. ADRENAL GLANDS: No acute abnormality. KIDNEYS, URETERS AND BLADDER: No stones in the kidneys or ureters. No hydronephrosis. No perinephric or periureteral stranding. Mildly thick walled bladder, correlate for cystitis. GI AND BOWEL: Stomach demonstrates no acute abnormality. There is no bowel obstruction. Moderate colonic stool burden, suggesting constipation. PERITONEUM AND RETROPERITONEUM: No ascites. No free air. VASCULATURE: Atherosclerotic calcifications of the abdominal aorta and  branch vessels, although patent. LYMPH NODES: No lymphadenopathy. REPRODUCTIVE ORGANS: Uterus is unremarkable. BONES AND SOFT TISSUES: No acute osseous abnormality. No focal soft tissue abnormality. IMPRESSION: 1. Mildly thick-walled bladder, correlate for cystitis. 2. Otherwise negative. Electronically signed by: Pinkie Pebbles MD 10/27/2023 03:53 AM EDT RP Workstation: [id number]   DG Chest Port 1 View Result Date: 10/27/2023 EXAM: 1 VIEW XRAY OF THE CHEST 10/27/2023 12:36:00 AM COMPARISON: 09/29/2023 CLINICAL HISTORY: Questionable sepsis - evaluate for abnormality. Table formatting from the original note was not included. Images from the original note were not included. Arrives [facility] from home as an activated Code Stroke. 2300 went to restroom as she's had urinary frequency and ~2315 was found with increased weakness of right arm and leg. Slow slurred speech and alert/ oriented x 3. Recently started Plavix  after a TIA. FINDINGS: LUNGS AND PLEURA: Mild increased interstitial markings, chronic, without frank interstitial edema. No focal pulmonary opacity. No pleural effusion. No pneumothorax. HEART AND MEDIASTINUM: No acute abnormality of the cardiac and mediastinal silhouettes. Thoracic aortic atherosclerosis. BONES AND SOFT TISSUES: No acute osseous abnormality.  IMPRESSION: 1. No acute cardiopulmonary process. Electronically signed by: Pinkie Pebbles MD 10/27/2023 12:42 AM EDT RP Workstation: [id number]   CT HEAD CODE STROKE WO CONTRAST Result Date: 10/27/2023 CLINICAL DATA:  Code stroke.  Neuro deficit, acute, stroke suspected EXAM: CT HEAD WITHOUT CONTRAST TECHNIQUE: Contiguous axial images were obtained from the base of the skull through the vertex without intravenous contrast. RADIATION DOSE REDUCTION: This exam was performed according to the departmental dose-optimization program which includes automated exposure control, adjustment of the mA and/or kV according to patient size and/or use of iterative reconstruction technique. COMPARISON:  CT head 09/29/2023. FINDINGS: Brain: No evidence of acute infarction, hemorrhage, hydrocephalus, extra-axial collection or mass lesion/mass effect. Moderate patchy white matter hypodensities are nonspecific but compatible with chronic microvascular disease that appears similar to prior. Vascular: No hyperdense vessel. Skull: No acute fracture. 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 ASPECTS [facility] Stroke Program Early CT Score) Total score (0-10 with 10 being normal): 10. IMPRESSION: 1. No evidence of acute intracranial abnormality.  ASPECTS is 10. 2. 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 Recommend correlation with direct inspection. Code stroke imaging results were communicated on 10/27/2023 at 12:31 am to provider Dr. Vanessa via secure text paging. Electronically Signed   By: Gilmore GORMAN Molt M.D.   On: 10/27/2023 00:32     .Critical Care  Performed by: Beverley Leita LABOR, PA-C Authorized by: Beverley Leita LABOR, PA-C   Critical care provider statement:    Critical care time (minutes):  30   Critical care was time spent personally by me on the following activities:  Development of treatment plan with patient or surrogate, discussions with consultants, evaluation of patient's response to treatment,  examination of patient, ordering and review of laboratory studies, ordering and review of radiographic studies, ordering and performing treatments and interventions, pulse oximetry, re-evaluation of patient's condition and review of old charts    Medications Ordered in the ED  acetaminophen  (TYLENOL ) tablet 650 mg (has no administration in time range)    Or  acetaminophen  (TYLENOL ) suppository 650 mg (has no administration in time range)  melatonin tablet 3 mg (has no administration in time range)  ondansetron  (ZOFRAN ) injection 4 mg (has no administration in time range)  lactated ringers  infusion ( Intravenous New Bag/Given 10/27/23 0454)  cefTRIAXone  (ROCEPHIN ) 1 g in sodium chloride  0.9 % 100 mL IVPB (has no administration in time range)  sodium chloride  0.9 % bolus 1,000 mL (1,000 mLs Intravenous New Bag/Given 10/27/23 0137)  cefTRIAXone  (ROCEPHIN ) 1 g in sodium chloride  0.9 % 100 mL IVPB (1 g Intravenous New Bag/Given 10/27/23 0147)  acetaminophen  (TYLENOL ) tablet 650 mg (650 mg Oral Given 10/27/23 0327)  iohexol  (OMNIPAQUE ) 350 MG/ML injection 65 mL (65 mLs Intravenous Contrast Given 10/27/23 0345)                                    Medical Decision Making Amount and/or Complexity of Data Reviewed Labs: ordered. Radiology: ordered.  Risk OTC drugs. Prescription drug management. Decision regarding hospitalization.   This patient presents to the ED for concern of code stroke for right-sided weakness, change in mental status, this involves an extensive number of treatment options, and is a complaint that carries with it a high risk of complications and morbidity.  The differential diagnosis includes sepsis, urinary tract infection, stroke, metabolic or electrolyte abnormality, intra-abdominal pathology including ureterolithiasis, acute cholecystitis, viral illness.   Co morbidities / Chronic conditions that complicate the patient evaluation  MS, TIA, dementia,  hypertension   Additional history obtained:  Additional history obtained from EMR External records from outside source obtained and reviewed including prior echo with EF 65-70% 09/29/23   Lab Tests:  I Ordered, and personally interpreted labs.  The pertinent results include: Initial lactic is elevated at 2.0.  Repeat improved to 0.7.  Urinalysis concerning for infection including moderate hemoglobin with protein, nitrates, leukocytes, many bacteria, will send for culture.  Respiratory panel negative for COVID, flu, RSV.  UDS negative, EtOH negative.  Blood cultures pending.  Magnesium  normal.  Metabolic panel without significant findings.  CBC with leukocytosis white count of 16.2.  Hemoglobin 10.9, down from 13 3 weeks ago.   Imaging Studies ordered:  I ordered imaging studies including CT head, CT on pelvis, chest x-ray I independently visualized and interpreted imaging which showed CT abdomen pelvis likely cystitis I agree with the radiologist interpretation   Cardiac Monitoring: / EKG:  The patient was maintained on a cardiac monitor.  I personally viewed and interpreted the cardiac monitored which showed an underlying rhythm of: Sinus rhythm, rate 96   Problem List / ED Course / Critical interventions / Medication management  78 year old female arrives via EMS code stroke with concern for right sided deficits and generalized weakness.  On arrival, found afebrile with a temperature of 101.5.  Evaluated by neurology on arrival, CT head obtained, neuro has signed off on care, no further neurowork-up required.  Sepsis workup concerning for leukocytosis with white count of 16.2, elevated lactic acid and fever with stable BP.  Patient provided with IV fluids and Rocephin .  Found to have UTI.  Admitted to hospital service. I ordered medication including rocephin , IVF   Reevaluation of the patient after these medicines showed that the patient stable I have reviewed the patients home  medicines and have made adjustments as needed   Consultations Obtained:  I requested consultation with the neurohospitalist, Dr. Vanessa,  and discussed lab and imaging findings as well as pertinent plan - they recommend: non contrast CT head, no further work up needed from neuro stand point. Discussed with Dr. Marcene with [facility] service who will consult for admission    Social Determinants of Health:  Lives at home with daughter   Test / Admission - Considered:  admit      Final diagnoses:  Sepsis without acute organ dysfunction, due to unspecified organism [facility])  Acute cystitis with hematuria    ED Discharge Orders     None          Beverley Leita LABOR, PA-C 10/27/23 [id number]    Bari Charmaine FALCON, MD 10/28/23 [phone]

## 2023-10-27 NOTE — Progress Notes (Signed)
  Carryover admission to the Day Admitter.  I discussed this case with the EDP, Leita Chancy, PA.  Per these discussions:   This is a 78 year old female who was hospitalized last month for suspected TIA, who is being admitted today for severe sepsis due to urinary tract infection complicated by acute metabolic encephalopathy after presenting with new onset altered mental status, confusion, which the patient's daughter noted around 2315 this evening.  Daughter also felt that the patient was weak on her right side, which is new for her.   Consequently, she presented as a code stroke to [facility] emergency department.  CT head showed no evidence of acute intracranial process, and on-call neurology, Dr. Vanessa felt that presentation was less likely to be representative of an acute CVA and did not recommend additional stroke workup at this time.  Vital signs in the ED were notable for temperature max 101.  CBC notable for white blood cell count 16,000.  Urinalysis was reported to be consistent with urinary tract infection.  Initial lactate 2.0, repeat lactic acid level currently pending.  Blood cultures x 2 and urine culture were collected followed by initiation of Rocephin .  I have placed an order for inpatient admission for further evaluation management of the above.  I have placed some additional preliminary admit orders via the adult multi-morbid admission order set. I have also ordered lactated Ringer's at 75 cc/h x 8 hours, with repeat lactate currently pending.  I continued the Rocephin  that was initiated in the ED this evening and have ordered morning labs in the form of BMP, CBC, and magnesium  level.    Eva Pore, DO Hospitalist

## 2023-10-27 NOTE — H&P (Addendum)
 History and Physical    [name] FMW:[id number] DOB: [birth year] DOA: 10/27/2023  DOS: the patient was seen and examined on 10/27/2023  PCP: Michelle Harlene POUR, NP   Patient coming from: Home  I have personally briefly reviewed patient's old medical records in [facility] and CareEverywhere  HPI:  No notes on file   Michelle Hodges is a 78 y.o. year old female with past medical history of hypertension, MS not on any home medications, dementia, hypercholesterolemia, PVD, and prior CVA with residual right-sided weakness on Plavix .  She presents to [facility] ED from home via EMS as a code stroke.  Around 11 PM last evening daughter assisted patient off of the commode and noted worsened right-sided weakness, slurred speech, and confusion.  Also of note patient endorses dysuria with reports of tingling with urination and urinary frequency despite not drinking a lot of water, they report this has been ongoing for about 1 month.   ED Course: On arrival to [facility] ED patient was noted to be febrile temp 38.6 C, BP 155/91, HR 103, RR 20, SpO2 90% on room air.  Labs notable for WBC 16.2, hemoglobin 10.9, lactic acid 2.0, urinalysis with trace leukocytes, nitrites, and many bacteria, blood and urine cultures drawn and pending.  She was also noted to be COVID, flu, RSV negative.  Neurology saw patient in ED and felt symptoms of worsening prior stroke symptoms were due to infectious/metabolic process.  CT head obtained and showed no acute intracranial abnormality, CXR obtained and shows no acute cardiopulmonary process,CT abdomen pelvis obtained and shows mildly thickened bladder wall consistent with cystitis.  She was given Tylenol , Rocephin , and 1 L NS bolus. TRH contacted for admission.  Review of Systems: As mentioned in the history of present illness. All other systems reviewed and are negative. Review of Systems  Constitutional:  Negative for chills and fever.  HENT:  Negative for  congestion.   Respiratory:  Negative for cough.   Cardiovascular:  Negative for chest pain, palpitations and leg swelling.  Gastrointestinal:  Negative for heartburn, nausea and vomiting.  Genitourinary:  Positive for dysuria and frequency. Negative for flank pain.  Musculoskeletal:  Negative for myalgias.  Neurological:  Positive for dizziness, speech change and weakness.  All other systems reviewed and are negative.   Past Medical History:  Diagnosis Date   History of blood transfusion    Hypertension    MS (multiple sclerosis)    Senile dementia (HCC)     Past Surgical History:  Procedure Laterality Date   COLONOSCOPY N/A 04/20/2012   Procedure: COLONOSCOPY;  Surgeon: Gordy CHRISTELLA Starch, MD;  Location[facility] ENDOSCOPY;  Service: Gastroenterology;  Laterality: N/A;   CYSTECTOMY     in HS   TONSILLECTOMY       reports that she quit smoking about 39 years ago. Her smoking use included cigarettes. She started smoking about 54 years ago. She has a 3.8 pack-year smoking history. She has never used smokeless tobacco. She reports current alcohol use. She reports current drug use. Drug: Marijuana.  No Known Allergies  Family History  Problem Relation Age of Onset   Hypertension Mother    Breast cancer Mother 28   Hypertension Father    Stroke Father    Hypertension Sister    Stroke Sister    Breast cancer Sister    Hypertension Brother    Cancer - Other Maternal Grandmother        stomach  Multiple sclerosis Cousin    Colon cancer Neg Hx    Rectal cancer Neg Hx     Prior to Admission medications   Medication Sig Start Date End Date Taking? Authorizing Provider  acetaminophen  (TYLENOL ) 500 MG tablet Take 500 mg by mouth every 6 (six) hours as needed for mild pain (pain score 1-3) or headache.   Yes [provider]  amLODipine  (NORVASC ) 5 MG tablet TAKE 1 TABLET (5 MG TOTAL) BY MOUTH DAILY. 06/16/23  Yes Michelle Harlene POUR, NP  clopidogrel  (PLAVIX ) 75 MG tablet Take 1  tablet (75 mg total) by mouth daily. 10/01/23 12/30/23 Yes Pahwani, Fredia, MD  HOMEOPATHIC PRODUCTS EX Apply 1 Application topically as needed (joint and arthritic pain). Mixture of clove oil and jojoba oil, and emollient .   Yes [provider]  MAGNESIUM  BISGLYCINATE PO Take 200 mg by mouth at bedtime.   Yes [provider]  rosuvastatin  (CRESTOR ) 10 MG tablet Take 1 tablet (10 mg total) by mouth daily. 09/30/23 10/30/23 Yes Vernon Fredia, MD  Vitamin D-Vitamin K (VITAMIN K2-VITAMIN D3 PO) Take 1 tablet by mouth daily.   Yes [provider]    Physical Exam: Vitals:   10/27/23 0020 10/27/23 0100 10/27/23 0200 10/27/23 0300  BP: (!) 155/91 137/89 130/82 117/74  Pulse: (!) 103 93 91 89  Resp: 20 16 14 17   Temp: (!) 101.5 F (38.6 C)  99.9 F (37.7 C) 98.7 F (37.1 C)  TempSrc:    Oral  SpO2: 99% 100% 98% 98%  Weight: 59 kg     Height: 5' 9 (1.753 m)       Physical Exam Vitals and nursing note reviewed.  Constitutional:      General: She is not in acute distress. HENT:     Head: Normocephalic.  Eyes:     Pupils: Pupils are equal, round, and reactive to light.  Cardiovascular:     Rate and Rhythm: Normal rate and regular rhythm.     Pulses: Normal pulses.     Heart sounds: Normal heart sounds. No murmur heard.    No friction rub. No gallop.  Pulmonary:     Effort: Pulmonary effort is normal.     Breath sounds: Normal breath sounds. No wheezing, rhonchi or rales.  Abdominal:     General: Bowel sounds are normal. There is distension.     Tenderness: There is no abdominal tenderness. There is no guarding.  Skin:    General: Skin is warm and dry.     Capillary Refill: Capillary refill takes less than 2 seconds.  Neurological:     General: No focal deficit present.     Mental Status: She is alert and oriented to person, place, and time.     Motor: Weakness present.      Labs on Admission: I have personally reviewed following labs and imaging  studies  CBC: Recent Labs  Lab 10/27/23 0019 10/27/23 0500  WBC 16.2* 15.9*  NEUTROABS 14.1* 13.5*  HGB 10.9*  11.2* 9.8*  HCT 34.3*  33.0* 30.5*  MCV 93.2 91.3  PLT 283 275   Basic Metabolic Panel: Recent Labs  Lab 10/27/23 0019 10/27/23 0500  NA 139  141 140  K 3.9  3.9 3.6  CL 105  108 108  CO2 22 23  GLUCOSE 132*  135* 121*  BUN 13  15 12   CREATININE 0.95  1.00 0.89  CALCIUM  9.4 8.7*  MG  --  2.0   GFR: Estimated  Creatinine Clearance: 49.3 mL/min (by C-G formula based on SCr of 0.89 mg/dL). Liver Function Tests: Recent Labs  Lab 10/27/23 0019  AST 27  ALT 17  ALKPHOS 77  BILITOT 0.7  PROT 5.8*  ALBUMIN 3.2*   No results for input(s): LIPASE, AMYLASE in the last 168 hours. No results for input(s): AMMONIA in the last 168 hours. Coagulation Profile: Recent Labs  Lab 10/27/23 0019  INR 1.0   Cardiac Enzymes: No results for input(s): CKTOTAL, CKMB, CKMBINDEX, TROPONINI, TROPONINIHS in the last 168 hours. BNP (last 3 results) No results for input(s): BNP in the last 8760 hours. HbA1C: No results for input(s): HGBA1C in the last 72 hours. CBG: Recent Labs  Lab 10/27/23 0014  GLUCAP 146*   Lipid Profile: No results for input(s): CHOL, HDL, LDLCALC, TRIG, CHOLHDL, LDLDIRECT in the last 72 hours. Thyroid Function Tests: No results for input(s): TSH, T4TOTAL, FREET4, T3FREE, THYROIDAB in the last 72 hours. Anemia Panel: No results for input(s): VITAMINB12, FOLATE, FERRITIN, TIBC, IRON, RETICCTPCT in the last 72 hours. Urine analysis:    Component Value Date/Time   COLORURINE YELLOW 10/27/2023 0051   APPEARANCEUR CLEAR 10/27/2023 0051   LABSPEC 1.008 10/27/2023 0051   PHURINE 7.0 10/27/2023 0051   GLUCOSEU NEGATIVE 10/27/2023 0051   HGBUR MODERATE (A) 10/27/2023 0051   BILIRUBINUR NEGATIVE 10/27/2023 0051   KETONESUR NEGATIVE 10/27/2023 0051   PROTEINUR 30 (A) 10/27/2023 0051    UROBILINOGEN 0.2 04/15/2012 1428   NITRITE POSITIVE (A) 10/27/2023 0051   LEUKOCYTESUR TRACE (A) 10/27/2023 0051    Radiological Exams on Admission: I have personally reviewed images CT ABDOMEN PELVIS W CONTRAST Result Date: 10/27/2023 EXAM: CT ABDOMEN AND PELVIS WITH CONTRAST 10/27/2023 03:44:35 AM TECHNIQUE: CT of the abdomen and pelvis was performed with the administration of intravenous contrast. Multiplanar reformatted images are provided for review. Automated exposure control, iterative reconstruction, and/or weight-based adjustment of the mA/kV was utilized to reduce the radiation dose to as low as reasonably achievable. COMPARISON: 04/17/2012 CLINICAL HISTORY: Abdominal pain, acute, nonlocalized. Bladder infection. FINDINGS: LOWER CHEST: Mild bibasilar atelectasis. LIVER: The liver is unremarkable. GALLBLADDER AND BILE DUCTS: Gallbladder is unremarkable. No biliary ductal dilatation. SPLEEN: No acute abnormality. PANCREAS: No acute abnormality. ADRENAL GLANDS: No acute abnormality. KIDNEYS, URETERS AND BLADDER: No stones in the kidneys or ureters. No hydronephrosis. No perinephric or periureteral stranding. Mildly thick walled bladder, correlate for cystitis. GI AND BOWEL: Stomach demonstrates no acute abnormality. There is no bowel obstruction. Moderate colonic stool burden, suggesting constipation. PERITONEUM AND RETROPERITONEUM: No ascites. No free air. VASCULATURE: Atherosclerotic calcifications of the abdominal aorta and branch vessels, although patent. LYMPH NODES: No lymphadenopathy. REPRODUCTIVE ORGANS: Uterus is unremarkable. BONES AND SOFT TISSUES: No acute osseous abnormality. No focal soft tissue abnormality. IMPRESSION: 1. Mildly thick-walled bladder, correlate for cystitis. 2. Otherwise negative. Electronically signed by: Pinkie Pebbles MD 10/27/2023 03:53 AM EDT RP Workstation: [id number]   DG Chest Port 1 View Result Date: 10/27/2023 EXAM: 1 VIEW XRAY OF THE CHEST 10/27/2023  12:36:00 AM COMPARISON: 09/29/2023 CLINICAL HISTORY: Questionable sepsis - evaluate for abnormality. Table formatting from the original note was not included. Images from the original note were not included. Arrives [facility] from home as an activated Code Stroke. 2300 went to restroom as she's had urinary frequency and ~2315 was found with increased weakness of right arm and leg. Slow slurred speech and alert/ oriented x 3. Recently started Plavix  after a TIA. FINDINGS: LUNGS AND PLEURA: Mild increased interstitial markings, chronic, without frank interstitial  edema. No focal pulmonary opacity. No pleural effusion. No pneumothorax. HEART AND MEDIASTINUM: No acute abnormality of the cardiac and mediastinal silhouettes. Thoracic aortic atherosclerosis. BONES AND SOFT TISSUES: No acute osseous abnormality. IMPRESSION: 1. No acute cardiopulmonary process. Electronically signed by: Pinkie Pebbles MD 10/27/2023 12:42 AM EDT RP Workstation: [id number]   CT HEAD CODE STROKE WO CONTRAST Result Date: 10/27/2023 CLINICAL DATA:  Code stroke.  Neuro deficit, acute, stroke suspected EXAM: CT HEAD WITHOUT CONTRAST TECHNIQUE: Contiguous axial images were obtained from the base of the skull through the vertex without intravenous contrast. RADIATION DOSE REDUCTION: This exam was performed according to the departmental dose-optimization program which includes automated exposure control, adjustment of the mA and/or kV according to patient size and/or use of iterative reconstruction technique. COMPARISON:  CT head 09/29/2023. FINDINGS: Brain: No evidence of acute infarction, hemorrhage, hydrocephalus, extra-axial collection or mass lesion/mass effect. Moderate patchy white matter hypodensities are nonspecific but compatible with chronic microvascular disease that appears similar to prior. Vascular: No hyperdense vessel. Skull: No acute fracture. 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 ASPECTS [facility] Stroke Program  Early CT Score) Total score (0-10 with 10 being normal): 10. IMPRESSION: 1. No evidence of acute intracranial abnormality.  ASPECTS is 10. 2. 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 Recommend correlation with direct inspection. Code stroke imaging results were communicated on 10/27/2023 at 12:31 am to provider Dr. Vanessa via secure text paging. Electronically Signed   By: Gilmore GORMAN Molt M.D.   On: 10/27/2023 00:32    EKG: My personal interpretation of EKG shows: Normal sinus rhythm heart rate 96 with minimal ST depression inferior leads   Assessment/Plan Principal Problem:   Severe sepsis (HCC)   ##Severe Sepsis secondary to Acute Cystitis - Continue Rocephin  - s/p 1L NS in ED and now getting LR @ 75 mL/hr  - Follow Urine and Blood cultures - Follow Fever curve - Check PCT  ##Acute Metabolic Encephalopathy - Treat as above - Neurology initially consulted for confusion and stroke like symptoms, Neuro felt symptoms were likely due to metabolic/infectious process - Delirium precautions  #Prior CVA with residual right-sided weakness  #Recent TIA Hospitalized 8/26-8/27/25 - Continue home Plavix  and Lipitor - Fall precautions  #Hypertension - Hold home amlodipine  in setting of sepsis - PRN IV Hydralazine   #Normocytic Anemia Patient and daughter deny signs of bleeding, bruising, or melena - Check Anemia panel  VTE prophylaxis:  SCDs  GI prophylaxis: Not indicated  Diet: Heart healthy Access: PIV Lines: None Code Status:  Full Code Telemetry: No  Admission status: Inpatient, Telemetry bed Patient is from: Home Anticipated d/c is to: Home  Anticipated d/c date is: 2-3 days Patient currently: Receiving IV antibiotics, IV fluids, pending symptomatic improvement  Family Communication: Daughter [name] at bedside  Consults called: Neurology consulted by EDP   Severity of Illness: The appropriate patient status for this patient is INPATIENT. Inpatient status is judged  to be reasonable and necessary in order to provide the required intensity of service to ensure the patient's safety. The patient's presenting symptoms, physical exam findings, and initial radiographic and laboratory data in the context of their chronic comorbidities is felt to place them at high risk for further clinical deterioration. Furthermore, it is not anticipated that the patient will be medically stable for discharge from the hospital within 2 midnights of admission.   * I certify that at the point of admission it is my clinical judgment that the patient will require inpatient hospital care spanning beyond  2 midnights from the point of admission due to high intensity of service, high risk for further deterioration and high frequency of surveillance required.*  To reach the provider On-Call:   7AM- 7PM see care teams to locate the attending and reach out to them via [email or web address] Password: [email or web address] 7PM-7AM contact night-coverage If you still have difficulty reaching the appropriate provider, please page the [facility] (Director on Call) for [facility] on amion for assistance  This document was prepared using Conservation officer, historic buildings and may include unintentional dictation errors.  Rockie Rams FNP-BC, PMHNP-BC Nurse Practitioner [facility]

## 2023-10-27 NOTE — ED Notes (Signed)
 Unable to collect urine sample, placed pt on a purewick

## 2023-10-27 NOTE — ED Triage Notes (Signed)
 Arrives [facility] from home as an activated Code Stroke.   2300 went to restroom as she's had urinary frequency and ~2315 was found with increased weakness of right arm/ and leg. Slow slurred speech and alert/ oriented x 3. (Incorrect year).   Recently started Plavix  after a TIA.   Febrile at 101.5 on arrival.

## 2023-10-27 NOTE — ED Notes (Signed)
 Patient bib EMS coming from home for increased weakness to right side arm/leg,urinary frequency. On arrival pt [id number] disoriented to time, febrile, neuro md assessed pt. Med hx. TIA, on thinners

## 2023-10-27 NOTE — Code Documentation (Signed)
 Stroke Response Nurse Documentation Code Documentation  Michelle Hodges is a 78 y.o. female arriving to [facility]  via [facility] on 9/23 with past medical hx of  HTN, MS, dementia, TIA, prior stroke with residual R sided weakness . On clopidogrel  75 mg daily. Code stroke was activated by EMS.   Patient from home where she was LKW at 2300 and now complaining of worsening right sided weakness. Pt was in the bathroom and too weak to get off the toilet per EMS/family. Febrile and frequent urination.  Stroke team at the bedside on patient arrival. Labs drawn and patient cleared for CT by L. Beverley PA. Patient to CT with team. NIHSS 1, see documentation for details and code stroke times. Patient with disoriented on exam. The following imaging was completed:  CT Head. Patient is not a candidate for IV Thrombolytic due to resolving symptoms. Patient is not a candidate for IR due to low suspicion for acute stroke.   Code stroke cancelled at 0029.    Bedside handoff with ED RN.    Griselda Alm ORN  Rapid Response RN

## 2023-10-27 NOTE — ED Notes (Signed)
 Attempt X 2 to call 2W and notify of pt pending arrival. No answer, pt transported to unit via transport tech

## 2023-10-27 NOTE — Consult Note (Addendum)
 NEUROLOGY CONSULT NOTE   Date of service: October 27, 2023 Patient Name: Michelle Hodges MRN:  [id number] DOB:  [birth year] Chief Complaint: R sided weakness Requesting Provider: Bari Charmaine FALCON, MD  History of Present Illness  Michelle Hodges is a 78 y.o. female with hx of Htn, MS, dementia, TIA, prior stroke with residual R sided weakness who is brought in as a code stroke by EMS for worsening baseline R sided weakness, disoriented to year, slight slurred speech, febrile, slightly distended abdomen and dizziness all day along with urinating multiple times throughout the day.  Patient walked to bathroom sat on the toilet and had difficulty getting up. EMS called. She was brought in as a code stroke for noted R sided weakness.  On arrival, she feels the weakness is similar to her prior residual weakness.  Pt reports that she is mostly wheelchair dependent but does walk short distances with cane.  She also endorses urinating a lot despite not drinking much water and that it tingles when she pees. Tingling has been happening for a month.  LKW: 2300 Modified rankin score: 3-Moderate disability-requires help but walks WITHOUT assistance IV Thrombolysis: not offered, too mild to treat, low suspicion for stroke. EVT: not offered, too mild to treat, low suspicion for stroke.  NIHSS components Score: Comment  1a Level of Conscious 0[x]  1[]  2[]  3[]      1b LOC Questions 0[]  1[x]  2[]     Did not know the month  1c LOC Commands 0[x]  1[]  2[]       2 Best Gaze 0[x]  1[]  2[]       3 Visual 0[x]  1[]  2[]  3[]      4 Facial Palsy 0[x]  1[]  2[]  3[]      5a Motor Arm - left 0[x]  1[]  2[]  3[]  4[]  UN[]    5b Motor Arm - Right 0[x]  1[]  2[]  3[]  4[]  UN[]    6a Motor Leg - Left 0[x]  1[]  2[]  3[]  4[]  UN[]    6b Motor Leg - Right 0[x]  1[]  2[]  3[]  4[]  UN[]    7 Limb Ataxia 0[x]  1[]  2[]  UN[]      8 Sensory 0[x]  1[]  2[]  UN[]      9 Best Language 0[x]  1[]  2[]  3[]      10 Dysarthria 0[x]  1[]  2[]  UN[]      11 Extinct. and  Inattention 0[x]  1[]  2[]       TOTAL: 1      ROS  Comprehensive ROS performed and pertinent positives documented in HPI   Past History   Past Medical History:  Diagnosis Date   History of blood transfusion    Hypertension    MS (multiple sclerosis)    Senile dementia (HCC)     Past Surgical History:  Procedure Laterality Date   COLONOSCOPY N/A 04/20/2012   Procedure: COLONOSCOPY;  Surgeon: Gordy CHRISTELLA Starch, MD;  Location: [facility] ENDOSCOPY;  Service: Gastroenterology;  Laterality: N/A;   CYSTECTOMY     in HS   TONSILLECTOMY      Family History: Family History  Problem Relation Age of Onset   Hypertension Mother    Breast cancer Mother 62   Hypertension Father    Stroke Father    Hypertension Sister    Stroke Sister    Breast cancer Sister    Hypertension Brother    Cancer - Other Maternal Grandmother        stomach   Multiple sclerosis Cousin    Colon cancer Neg Hx    Rectal cancer Neg Hx     Social History  reports that she quit smoking about 39 years ago. Her smoking use included cigarettes. She started smoking about 54 years ago. She has a 3.8 pack-year smoking history. She has never used smokeless tobacco. She reports current alcohol use. She reports current drug use. Drug: Marijuana.  No Known Allergies  Medications  No current facility-administered medications for this encounter.  Current Outpatient Medications:    acetaminophen  (TYLENOL ) 325 MG tablet, Take 650 mg by mouth every 6 (six) hours as needed., Disp: , Rfl:    amLODipine  (NORVASC ) 5 MG tablet, TAKE 1 TABLET (5 MG TOTAL) BY MOUTH DAILY., Disp: 90 tablet, Rfl: 1   clopidogrel  (PLAVIX ) 75 MG tablet, Take 1 tablet (75 mg total) by mouth daily., Disp: 90 tablet, Rfl: 0   MAGNESIUM  BISGLYCINATE PO, Take 200 mg by mouth at bedtime., Disp: , Rfl:    melatonin 5 MG TABS, Take 1-2 tablets by mouth at bedtime., Disp: , Rfl:    rosuvastatin  (CRESTOR ) 10 MG tablet, Take 1 tablet (10 mg total) by mouth daily., Disp:  30 tablet, Rfl: 0   Vitamin D-Vitamin K (VITAMIN K2-VITAMIN D3 PO), Take 1 tablet by mouth daily., Disp: , Rfl:   Vitals   Vitals:   10/27/23 0020  BP: (!) 155/91  Pulse: (!) 103  Resp: 20  Temp: (!) 101.5 F (38.6 C)  SpO2: 99%  Weight: 59 kg  Height: 5' 9 (1.753 m)    Body mass index is 19.2 kg/m.   Physical Exam   General: Laying comfortably in bed; in no acute distress.  HENT: Normal oropharynx and mucosa. Normal external appearance of ears and nose.  Neck: Supple, no pain or tenderness  CV: No JVD. No peripheral edema.  Pulmonary: Symmetric Chest rise. Normal respiratory effort.  Abdomen: Soft to touch, non-tender.  Ext: No cyanosis, edema, or deformity  Skin: No rash. Normal palpation of skin.   Musculoskeletal: Normal digits and nails by inspection. No clubbing.   Neurologic Examination  Mental status/Cognition: Alert, oriented to self, place,and year but not to month, good attention.  Speech/language: Fluent, comprehension intact, object naming intact, repetition intact.  Cranial nerves:   CN II Pupils equal and reactive to light, no VF deficits    CN III,IV,VI EOM intact, no gaze preference or deviation, no nystagmus    CN V normal sensation in V1, V2, and V3 segments bilaterally    CN VII no asymmetry, no nasolabial fold flattening    CN VIII normal hearing to speech    CN IX & X normal palatal elevation, no uvular deviation    CN XI 5/5 head turn and 5/5 shoulder shrug bilaterally    CN XII midline tongue protrusion   Motor:  Muscle bulk: poor, tone normal, pronator drift none tremor none Mvmt Root Nerve  Muscle Right Left Comments  SA C5/6 Ax Deltoid 4+ 5   EF C5/6 Mc Biceps 4+ 5   EE C6/7/8 Rad Triceps 4+ 5   WF C6/7 Med FCR     WE C7/8 PIN ECU     F Ab C8/T1 U ADM/FDI 4 5   HF L1/2/3 Fem Illopsoas 5 5   KE L2/3/4 Fem Quad     DF L4/5 D Peron Tib Ant 4+ 5   PF S1/2 Tibial Grc/Sol 4+ 5    Sensation:  Light touch Intact throughout   Pin prick     Temperature    Vibration   Proprioception    Coordination/Complex Motor:  - Finger to Nose  intact BL - Heel to shin intact BL - Rapid alternating movement are slowed on the right - Gait: deferred.  Labs/Imaging/Neurodiagnostic studies   CBC:  Recent Labs  Lab 11/10/2023 0019  WBC 16.2*  NEUTROABS 14.1*  HGB 10.9*  11.2*  HCT 34.3*  33.0*  MCV 93.2  PLT 283   Basic Metabolic Panel:  Lab Results  Component Value Date   NA 141 10-Nov-2023   K 3.9 2023/11/10   CO2 20 (L) 09/30/2023   GLUCOSE 135 (H) 2023-11-10   BUN 15 2023-11-10   CREATININE 1.00 11/10/2023   CALCIUM  9.3 09/30/2023   GFRNONAA 53 (L) 09/30/2023   GFRAA 76 (L) 04/22/2012   Lipid Panel:  Lab Results  Component Value Date   LDLCALC 76 09/30/2023   HgbA1c:  Lab Results  Component Value Date   HGBA1C 5.4 09/29/2023   Urine Drug Screen:     Component Value Date/Time   LABOPIA NONE DETECTED 04/16/2012 0540   COCAINSCRNUR NONE DETECTED 04/16/2012 0540   LABBENZ NONE DETECTED 04/16/2012 0540   AMPHETMU NONE DETECTED 04/16/2012 0540   THCU POSITIVE (A) 04/16/2012 0540   LABBARB NONE DETECTED 04/16/2012 0540    Alcohol Level     Component Value Date/Time   ETH <15 09/29/2023 0732   INR  Lab Results  Component Value Date   INR 1.0 09/29/2023   APTT  Lab Results  Component Value Date   APTT <22 (L) 09/29/2023   AED levels: No results found for: PHENYTOIN, ZONISAMIDE, LAMOTRIGINE, LEVETIRACETA  CT Head without contrast(Personally reviewed): CTH was negative for a large hypodensity concerning for a large territory infarct or hyperdensity concerning for an ICH  ASSESSMENT   Aloria Looper Cumber is a 78 y.o. female with hx of Htn, MS, dementia, TIA, prior stroke with residual R sided weakness who is brought in as a code stroke by EMS for worsening baseline R sided weakness, disoriented to year, slight slurred speech, febrile, slightly distended abdomen and dizziness all day along with  urinating multiple times throughout the day.  Exam today very similar to prior neuro exam from August of 2025. Patient feels weakness is similar to prior stroke and does not feel she is worse today. Unlikely for stroke to cause fever. Overall, low suspicion for stroke and suspect this is likely slight worsening of prior stroke symptoms in the setting of Ecoli cystitis.  RECOMMENDATIONS  - No further neuro workup. Low clinical suspicion for stroke. ______________________________________________________________________  Plan discussed with patient and with Leita Chancy with the ED team.  Signed, Trayonna Bachmeier, MD [facility]

## 2023-10-27 NOTE — ED Notes (Signed)
Report given to Nicholas RN

## 2023-10-28 DIAGNOSIS — R6520 Severe sepsis without septic shock: Secondary | ICD-10-CM | POA: Diagnosis not present

## 2023-10-28 DIAGNOSIS — A419 Sepsis, unspecified organism: Secondary | ICD-10-CM | POA: Diagnosis not present

## 2023-10-28 LAB — BASIC METABOLIC PANEL WITH GFR
Anion gap: 8 (ref 5–15)
BUN: 14 mg/dL (ref 8–23)
CO2: 25 mmol/L (ref 22–32)
Calcium: 8.9 mg/dL (ref 8.9–10.3)
Chloride: 109 mmol/L (ref 98–111)
Creatinine, Ser: 0.99 mg/dL (ref 0.44–1.00)
GFR, Estimated: 59 mL/min — ABNORMAL LOW (ref >60)
Glucose, Bld: 105 mg/dL — ABNORMAL HIGH (ref 70–99)
Potassium: 4 mmol/L (ref 3.5–5.1)
Sodium: 142 mmol/L (ref 135–145)

## 2023-10-28 LAB — CBC
HCT: 31.8 % — ABNORMAL LOW (ref 36.0–46.0)
Hemoglobin: 10.4 g/dL — ABNORMAL LOW (ref 12.0–15.0)
MCH: 29.7 pg (ref 26.0–34.0)
MCHC: 32.7 g/dL (ref 30.0–36.0)
MCV: 90.9 fL (ref 80.0–100.0)
Platelets: 265 K/uL (ref 150–400)
RBC: 3.5 MIL/uL — ABNORMAL LOW (ref 3.87–5.11)
RDW: 14.2 % (ref 11.5–15.5)
WBC: 12.6 K/uL — ABNORMAL HIGH (ref 4.0–10.5)
nRBC: 0 % (ref 0.0–0.2)

## 2023-10-28 LAB — CORTISOL-AM, BLOOD: Cortisol - AM: 11 ug/dL (ref 6.7–22.6)

## 2023-10-28 LAB — IRON AND TIBC
Iron: 13 ug/dL — ABNORMAL LOW (ref 28–170)
Saturation Ratios: 5 % — ABNORMAL LOW (ref 10.4–31.8)
TIBC: 277 ug/dL (ref 250–450)
UIBC: 264 ug/dL

## 2023-10-28 LAB — PROTIME-INR
INR: 1 (ref 0.8–1.2)
Prothrombin Time: 14 s (ref 11.4–15.2)

## 2023-10-28 LAB — FERRITIN: Ferritin: 143 ng/mL (ref 11–307)

## 2023-10-28 LAB — FOLATE: Folate: 8.3 ng/mL (ref >5.9)

## 2023-10-28 LAB — VITAMIN B12: Vitamin B-12: 369 pg/mL (ref 180–914)

## 2023-10-28 MED ORDER — MAGNESIUM BISGLYCINATE 100 MG PO TABS
200.0000 mg | ORAL_TABLET | Freq: Every day | ORAL | Status: DC
Start: 2023-10-28 — End: 2023-10-28

## 2023-10-28 MED ORDER — BACLOFEN 10 MG PO TABS
5.0000 mg | ORAL_TABLET | Freq: Three times a day (TID) | ORAL | Status: DC | PRN
  Administered 2023-10-28: 5 mg via ORAL
  Filled 2023-10-28: qty 1

## 2023-10-28 MED ORDER — AMLODIPINE BESYLATE 5 MG PO TABS
5.0000 mg | ORAL_TABLET | Freq: Every day | ORAL | Status: DC
Start: 2023-10-28 — End: 2023-10-29
  Administered 2023-10-28 – 2023-10-29 (×2): 5 mg via ORAL
  Filled 2023-10-28 (×2): qty 1

## 2023-10-28 MED ORDER — MELATONIN 5 MG PO TABS
5.0000 mg | ORAL_TABLET | Freq: Every day | ORAL | Status: DC
  Administered 2023-10-28: 5 mg via ORAL
  Filled 2023-10-28: qty 1

## 2023-10-28 MED ORDER — MAGNESIUM OXIDE -MG SUPPLEMENT 400 (240 MG) MG PO TABS
200.0000 mg | ORAL_TABLET | Freq: Every day | ORAL | Status: DC
  Administered 2023-10-28: 200 mg via ORAL
  Filled 2023-10-28: qty 1

## 2023-10-28 NOTE — Evaluation (Signed)
 Occupational Therapy Evaluation Patient Details Name: Michelle Hodges MRN: [id number] DOB: 05-03-1945 Today's Date: 10/28/2023   History of Present Illness   78 y.o. female presents to [facility]/27/23 with worsening R sided weakness, slurred speech and confusion. CT head negative, neurological symptoms thought to be 2/2 recrudescence of old stroke. Workup suggested severe sepsis 2/2 UTI. PMHx: hypertension, MS, dementia, hypercholesterolemia, PVD, and prior CVA with residual right-sided weakness     Clinical Impressions Prior to this admission, patient living with her daughter, and receiving home health PT. Patient with a few falls in the last few months, often when she forgets her rollator. She requires min A for ADL management. Currently, patient oriented, and requiring min-mod A for ADLs and min A for functional mobility. Patient would benefit from a RW at discharge due to increased speed of rollator leading to falls as well as HHOT at discharge due to increased need for assistance for lower body dressing and bathing. OT will follow acutely.      If plan is discharge home, recommend the following:   A little help with walking and/or transfers;A little help with bathing/dressing/bathroom;Assistance with cooking/housework;Direct supervision/assist for medications management;Direct supervision/assist for financial management;Assist for transportation;Help with stairs or ramp for entrance;Supervision due to cognitive status (initially)     Functional Status Assessment   Patient has had a recent decline in their functional status and demonstrates the ability to make significant improvements in function in a reasonable and predictable amount of time.     Equipment Recommendations   Other (comment) (RW)     Recommendations for Other Services         Precautions/Restrictions   Precautions Precautions: Fall Recall of Precautions/Restrictions: Impaired Restrictions Weight Bearing  Restrictions Per Provider Order: No     Mobility Bed Mobility Overal bed mobility: Needs Assistance Bed Mobility: Supine to Sit     Supine to sit: Min assist     General bed mobility comments: MinA to complete moving LE's off EOB. Cues to scoot hips forward    Transfers Overall transfer level: Needs assistance Equipment used: Rolling walker (2 wheels) Transfers: Sit to/from Stand Sit to Stand: Min assist           General transfer comment: MinA for slight boost-up, slight posterior lean with rise.      Balance Overall balance assessment: Mild deficits observed, not formally tested, Needs assistance Sitting-balance support: Bilateral upper extremity supported, Feet supported Sitting balance-Leahy Scale: Fair Sitting balance - Comments: posterior lean with dynamic activities with MinA needed to maintain balance Postural control: Posterior lean Standing balance support: Bilateral upper extremity supported, During functional activity, Reliant on assistive device for balance Standing balance-Leahy Scale: Poor Standing balance comment: slight posterior lean upon standing, MinA initially with progression to CGA                           ADL either performed or assessed with clinical judgement   ADL Overall ADL's : Needs assistance/impaired Eating/Feeding: Set up;Sitting   Grooming: Set up;Sitting   Upper Body Bathing: Minimal assistance;Sitting   Lower Body Bathing: Minimal assistance;Moderate assistance;Sit to/from stand;Sitting/lateral leans   Upper Body Dressing : Set up;Sitting   Lower Body Dressing: Minimal assistance;Moderate assistance;Sitting/lateral leans;Sit to/from stand   Toilet Transfer: Minimal assistance;Ambulation;Regular Toilet;Rolling walker (2 wheels) Toilet Transfer Details (indicate cue type and reason): simulated Toileting- Clothing Manipulation and Hygiene: Contact guard assist;Sitting/lateral lean;Sit to/from stand Toileting -  Clothing Manipulation Details (indicate cue  type and reason): for thoroughness     Functional mobility during ADLs: Minimal assistance;Cueing for safety;Cueing for sequencing;Rolling walker (2 wheels) General ADL Comments: Prior to this admission, patient living with her daughter, and receiving home health PT. Patient with a few falls in the last few months, often when she forgets her rollator. She requires min A for ADL management. Currently, patient oriented, and requiring min-mod A for ADLs and min A for functional mobility. Patient would benefit from a RW at discharge due to increased speed of rollator leading to falls as well as HHOT at discharge due to increased need for assistance for lower body dressing and bathing. OT will follow acutely.     Vision Baseline Vision/History: 1 Wears glasses Ability to See in Adequate Light: 0 Adequate Patient Visual Report: No change from baseline Vision Assessment?: No apparent visual deficits     Perception Perception: Not tested       Praxis Praxis: Not tested       Pertinent Vitals/Pain       Extremity/Trunk Assessment Upper Extremity Assessment Upper Extremity Assessment: Generalized weakness;Right hand dominant;RUE deficits/detail RUE Deficits / Details: R sided weakness with compensatory movements from RUE leading to increased scapular winging RUE Sensation: WNL RUE Coordination: decreased gross motor   Lower Extremity Assessment Lower Extremity Assessment: Defer to PT evaluation   Cervical / Trunk Assessment Cervical / Trunk Assessment: Other exceptions (R scapular winging)   Communication Communication Communication: No apparent difficulties   Cognition Arousal: Alert Behavior During Therapy: WFL for tasks assessed/performed Cognition: History of cognitive impairments             OT - Cognition Comments: STM deficits, alert and oriented                 Following commands: Impaired Following commands  impaired: Only follows one step commands consistently, Follows multi-step commands inconsistently     Cueing  General Comments   Cueing Techniques: Verbal cues;Tactile cues  Daughter present   Exercises     Shoulder Instructions      Home Living Family/patient expects to be discharged to:: Private residence Living Arrangements: Children (daughter) Available Help at Discharge: Family;Available 24 hours/day Type of Home: House Home Access: Level entry     Home Layout: One level     Bathroom Shower/Tub: Chief Strategy Officer: Handicapped height Bathroom Accessibility: Yes   Home Equipment: Rollator (4 wheels);Shower seat;Grab bars - tub/shower;Other (comment);Cane - single point          Prior Functioning/Environment Prior Level of Function : Needs assist             Mobility Comments: Ind with rollator or cane. Was receiving HHPT ADLs Comments: pt reports being independent with ADLs and to get in the tub. Daughter assists as needed for dressing    OT Problem List: Decreased strength;Decreased activity tolerance;Impaired balance (sitting and/or standing);Decreased coordination;Decreased safety awareness;Decreased knowledge of use of DME or AE   OT Treatment/Interventions: Self-care/ADL training;Therapeutic exercise;Energy conservation;DME and/or AE instruction;Manual therapy;Therapeutic activities;Patient/family education;Balance training      OT Goals(Current goals can be found in the care plan section)   Acute Rehab OT Goals Patient Stated Goal: to go home OT Goal Formulation: With patient/family Time For Goal Achievement: 11/11/23 Potential to Achieve Goals: Good ADL Goals Pt Will Perform Lower Body Bathing: with contact guard assist;sit to/from stand;sitting/lateral leans Pt Will Perform Lower Body Dressing: with contact guard assist;sit to/from stand;sitting/lateral leans Pt Will Transfer to Toilet: with  contact guard assist;regular height  toilet;ambulating;grab bars Pt Will Perform Toileting - Clothing Manipulation and hygiene: with contact guard assist;sitting/lateral leans;sit to/from stand Additional ADL Goal #1: Patient will be able to verbalize 3 strategies for fall prevention to ensure safe discharge home.   OT Frequency:  Min 2X/week    Co-evaluation   Reason for Co-Treatment: For patient/therapist safety;To address functional/ADL transfers PT goals addressed during session: Mobility/safety with mobility;Balance;Proper use of DME OT goals addressed during session: ADL's and self-care      AM-PAC OT 6 Clicks Daily Activity     Outcome Measure Help from another person eating meals?: A Little Help from another person taking care of personal grooming?: A Little Help from another person toileting, which includes using toliet, bedpan, or urinal?: A Little Help from another person bathing (including washing, rinsing, drying)?: A Lot Help from another person to put on and taking off regular upper body clothing?: A Little Help from another person to put on and taking off regular lower body clothing?: A Lot 6 Click Score: 16   End of Session Equipment Utilized During Treatment: Rolling walker (2 wheels);Gait belt Nurse Communication: Mobility status  Activity Tolerance: Patient tolerated treatment well Patient left: in chair;with call bell/phone within reach;with chair alarm set;with family/visitor present  OT Visit Diagnosis: Unsteadiness on feet (R26.81);Other abnormalities of gait and mobility (R26.89);Muscle weakness (generalized) (M62.81);History of falling (Z91.81)                Time: 8897-8873 OT Time Calculation (min): 24 min Charges:  OT General Charges $OT Visit: 1 Visit OT Evaluation $OT Eval Moderate Complexity: 1 Mod  Ronal Gift E. Dreshaun Stene, OTR/L Acute Rehabilitation Services [phone]   Ronal Gift Salt 10/28/2023, 2:52 PM

## 2023-10-28 NOTE — Plan of Care (Signed)

## 2023-10-28 NOTE — Progress Notes (Signed)
 PROGRESS NOTE  Michelle Hodges FMW:[id number] DOB: 01-12-1946 DOA: 10/27/2023 PCP: Caro Harlene POUR, NP   LOS: 1 day   Brief narrative:  Michelle Hodges is a 78 y.o. year old female with past medical history of hypertension, multiple sclerosis not on medications, dementia, hypercholesterolemia, PVD, and prior CVA with residual right-sided weakness on Plavix  presented to the hospital with code stroke.. Around 89 PM patient's daughter assisted patient off of the commode and noted worsened right-sided weakness, slurred speech, and confusion.  Reported dysuria, tingling with urination and urinary frequency for a month.  In the ED patient was febrile with a temperature of 101.5 F.,  Tachycardic, tachypneic and was saturating 90% on room air.  Labs were notable for WBC elevated at 16.2 with lactic acid elevation at 2.0.  Urinalysis showed trace leukocytes.  COVID influenza and RSV was negative.  Neurology was consulted and felt that symptoms of previous stroke where worsened due to infectious process.  CT head scan without any acute abnormality.  Chest x-ray did not show any infiltrate.  CT scan of the abdomen pelvis showed thickened bladder consistent with cystitis.  Patient was then admitted hospital for further evaluation and treatment.  Assessment/Plan: Principal Problem:   Severe sepsis (HCC) Active Problems:   Essential hypertension   History of CVA (cerebrovascular accident)   MS (multiple sclerosis)   Dementia without behavioral disturbance (HCC)   Acute metabolic encephalopathy   Cystitis   Normocytic anemia   Severe Sepsis secondary to Acute Cystitis Continue IV Rocephin .  Received IV fluids.  Latest temperature of 97.7 F, from temperature max of 101.5 F.  Lactate was initially elevated at 2.0 which has normalized at this time..  Leukocytosis slightly trended down.  Follow urine cultures pending, negative blood culture in 1 day.    Acute Metabolic Encephalopathy secondary to  cystitis. New stroke has been ruled out.  Continue delirium precautions.  As per the patient's daughter at bedside, she is still slightly confused.   Prior CVA with residual right-sided weakness  Recent TIA Hospitalized 8/26-8/27/25 for the same.  Continue Plavix  and Lipitor.  Continue fall precautions.  Essential hypertension On as needed hydralazine .  Will restart amlodipine    Normocytic Anemia No signs of bleeding.  Anemia panel with ferritin of 143 and iron low at 13.   Multiple sclerosis history/leg cramps.  Has history of multiple sclerosis.  Will add baclofen  to see if that helps.  Potassium within normal range.  Will get PT OT evaluation.  DVT prophylaxis: Place and maintain sequential compression device Start: 10/27/23 1056 SCDs Start: 10/27/23 0426   Disposition: Likely home with home health 10/29/2023  Status is: Inpatient Remains inpatient appropriate because: Pending clinical improvement, pending cultures, PT evaluation    Code Status:     Code Status: Full Code  Family Communication: Spoke with the patient's daughter at bedside  Consultants: None  Procedures: None  Anti-infectives:  Rocephin  IV  Anti-infectives (From admission, onward)    Start     Dose/Rate Route Frequency Ordered Stop   10/28/23 0130  cefTRIAXone  (ROCEPHIN ) 1 g in sodium chloride  0.9 % 100 mL IVPB        1 g 200 mL/hr over 30 Minutes Intravenous Every 24 hours 10/27/23 0427     10/27/23 0130  cefTRIAXone  (ROCEPHIN ) 1 g in sodium chloride  0.9 % 100 mL IVPB        1 g 200 mL/hr over 30 Minutes Intravenous  Once 10/27/23 0123 10/27/23 0320  Subjective: Today, patient was seen and examined at bedside.  Patient stated that she could not sleep well at nighttime.  Had some cramps on the right  lower extremity.  Patient's daughter at bedside.  Denies any nausea vomiting fever chills or rigor.  Daughter states that she is still not back to her baseline and is slow to  respond.  Objective: Vitals:   10/28/23 0754 10/28/23 0800  BP: (!) 142/97 (!) 142/97  Pulse: 74 75  Resp:  18  Temp: 97.7 F (36.5 C) 97.7 F (36.5 C)  SpO2: 97% 97%    Intake/Output Summary (Last 24 hours) at 10/28/2023 1119 Last data filed at 10/28/2023 0810 Gross per 24 hour  Intake 100 ml  Output 1700 ml  Net -1600 ml   Filed Weights   10/27/23 0020  Weight: 59 kg   Body mass index is 19.2 kg/m.   Physical Exam:  GENERAL: Patient is alert awake and oriented. Not in obvious distress.  Thinly built HENT: No scleral pallor or icterus. Pupils equally reactive to light. Oral mucosa is moist NECK: is supple, no gross swelling noted. CHEST: Clear to auscultation.  CVS: S1 and S2 heard, no murmur. Regular rate and rhythm.  ABDOMEN: Soft, non-tender, bowel sounds are present. EXTREMITIES: No edema.  Moves all the extremities, thin extremities CNS: Cranial nerves are intact.  Moves all extremities. SKIN: warm and dry without rashes.  Data Review: I have personally reviewed the following laboratory data and studies,  CBC: Recent Labs  Lab 10/27/23 0019 10/27/23 0500 10/28/23 0526  WBC 16.2* 15.9* 12.6*  NEUTROABS 14.1* 13.5*  --   HGB 10.9*  11.2* 9.8* 10.4*  HCT 34.3*  33.0* 30.5* 31.8*  MCV 93.2 91.3 90.9  PLT 283 275 265   Basic Metabolic Panel: Recent Labs  Lab 10/27/23 0019 10/27/23 0500 10/28/23 0526  NA 139  141 140 142  K 3.9  3.9 3.6 4.0  CL 105  108 108 109  CO2 22 23 25   GLUCOSE 132*  135* 121* 105*  BUN 13  15 12 14   CREATININE 0.95  1.00 0.89 0.99  CALCIUM  9.4 8.7* 8.9  MG  --  2.0  --    Liver Function Tests: Recent Labs  Lab 10/27/23 0019  AST 27  ALT 17  ALKPHOS 77  BILITOT 0.7  PROT 5.8*  ALBUMIN 3.2*   No results for input(s): LIPASE, AMYLASE in the last 168 hours. No results for input(s): AMMONIA in the last 168 hours. Cardiac Enzymes: No results for input(s): CKTOTAL, CKMB, CKMBINDEX, TROPONINI in  the last 168 hours. BNP (last 3 results) No results for input(s): BNP in the last 8760 hours.  ProBNP (last 3 results) No results for input(s): PROBNP in the last 8760 hours.  CBG: Recent Labs  Lab 10/27/23 0014  GLUCAP 146*   Recent Results (from the past 240 hours)  Urine Culture     Status: Abnormal (Preliminary result)   Collection Time: 10/27/23 12:51 AM   Specimen: Urine, Clean Catch  Result Value Ref Range Status   Specimen Description URINE, CLEAN CATCH  Final   Special Requests   Final    NONE Performed at [facility], [address] [place], [place] [address]    Culture >=100,000 COLONIES/mL GRAM NEGATIVE RODS (A)  Final   Report Status PENDING  Incomplete  Blood Culture (routine x 2)     Status: None (Preliminary result)   Collection Time: 10/27/23 12:54 AM  Specimen: BLOOD RIGHT ARM  Result Value Ref Range Status   Specimen Description BLOOD RIGHT ARM  Final   Special Requests   Final    BOTTLES DRAWN AEROBIC AND ANAEROBIC Blood Culture results may not be optimal due to an inadequate volume of blood received in culture bottles   Culture   Final    NO GROWTH 1 DAY Performed at [facility], [address], [place], [place] [address]    Report Status PENDING  Incomplete  Blood Culture (routine x 2)     Status: None (Preliminary result)   Collection Time: 10/27/23 12:54 AM   Specimen: BLOOD RIGHT ARM  Result Value Ref Range Status   Specimen Description BLOOD RIGHT ARM  Final   Special Requests   Final    BOTTLES DRAWN AEROBIC AND ANAEROBIC Blood Culture results may not be optimal due to an inadequate volume of blood received in culture bottles   Culture   Final    NO GROWTH 1 DAY Performed at [facility], [address], [place], [place] [address]    Report Status PENDING  Incomplete  Resp panel by RT-PCR (RSV, Flu A&B, Covid) Anterior Nasal Swab     Status: None   Collection Time: 10/27/23 12:54 AM   Specimen: Anterior Nasal Swab   Result Value Ref Range Status   SARS Coronavirus 2 by RT PCR NEGATIVE NEGATIVE Final   Influenza A by PCR NEGATIVE NEGATIVE Final   Influenza B by PCR NEGATIVE NEGATIVE Final    Comment: (NOTE) The Xpert Xpress SARS-CoV-2/FLU/RSV plus assay is intended as an aid in the diagnosis of influenza from Nasopharyngeal swab specimens and should not be used as a sole basis for treatment. Nasal washings and aspirates are unacceptable for Xpert Xpress SARS-CoV-2/FLU/RSV testing.  Fact Sheet for Patients: BloggerCourse.com  Fact Sheet for Healthcare Providers: SeriousBroker.it  This test is not yet approved or cleared by the [place]  FDA and has been authorized for detection and/or diagnosis of SARS-CoV-2 by FDA under an Emergency Use Authorization (EUA). This EUA will remain in effect (meaning this test can be used) for the duration of the COVID-19 declaration under Section 564(b)(1) of the Act, 21 U.S.C. section 360bbb-3(b)(1), unless the authorization is terminated or revoked.     Resp Syncytial Virus by PCR NEGATIVE NEGATIVE Final    Comment: (NOTE) Fact Sheet for Patients: BloggerCourse.com  Fact Sheet for Healthcare Providers: SeriousBroker.it  This test is not yet approved or cleared by the [place]  FDA and has been authorized for detection and/or diagnosis of SARS-CoV-2 by FDA under an Emergency Use Authorization (EUA). This EUA will remain in effect (meaning this test can be used) for the duration of the COVID-19 declaration under Section 564(b)(1) of the Act, 21 U.S.C. section 360bbb-3(b)(1), unless the authorization is terminated or revoked.  Performed at [facility], [address], [place], [place] [address]      Studies: CT ABDOMEN PELVIS W CONTRAST Result Date: 10/27/2023 EXAM: CT ABDOMEN AND PELVIS WITH CONTRAST 10/27/2023 03:44:35 AM TECHNIQUE: CT  of the abdomen and pelvis was performed with the administration of intravenous contrast. Multiplanar reformatted images are provided for review. Automated exposure control, iterative reconstruction, and/or weight-based adjustment of the mA/kV was utilized to reduce the radiation dose to as low as reasonably achievable. COMPARISON: 04/17/2012 CLINICAL HISTORY: Abdominal pain, acute, nonlocalized. Bladder infection. FINDINGS: LOWER CHEST: Mild bibasilar atelectasis. LIVER: The liver is unremarkable. GALLBLADDER AND BILE DUCTS: Gallbladder is unremarkable.  No biliary ductal dilatation. SPLEEN: No acute abnormality. PANCREAS: No acute abnormality. ADRENAL GLANDS: No acute abnormality. KIDNEYS, URETERS AND BLADDER: No stones in the kidneys or ureters. No hydronephrosis. No perinephric or periureteral stranding. Mildly thick walled bladder, correlate for cystitis. GI AND BOWEL: Stomach demonstrates no acute abnormality. There is no bowel obstruction. Moderate colonic stool burden, suggesting constipation. PERITONEUM AND RETROPERITONEUM: No ascites. No free air. VASCULATURE: Atherosclerotic calcifications of the abdominal aorta and branch vessels, although patent. LYMPH NODES: No lymphadenopathy. REPRODUCTIVE ORGANS: Uterus is unremarkable. BONES AND SOFT TISSUES: No acute osseous abnormality. No focal soft tissue abnormality. IMPRESSION: 1. Mildly thick-walled bladder, correlate for cystitis. 2. Otherwise negative. Electronically signed by: Pinkie Pebbles MD 10/27/2023 03:53 AM EDT RP Workstation: [id number]   DG Chest Port 1 View Result Date: 10/27/2023 EXAM: 1 VIEW XRAY OF THE CHEST 10/27/2023 12:36:00 AM COMPARISON: 09/29/2023 CLINICAL HISTORY: Questionable sepsis - evaluate for abnormality. Table formatting from the original note was not included. Images from the original note were not included. Arrives [facility] from home as an activated Code Stroke. 2300 went to restroom as she's had urinary frequency and ~2315 was  found with increased weakness of right arm and leg. Slow slurred speech and alert/ oriented x 3. Recently started Plavix  after a TIA. FINDINGS: LUNGS AND PLEURA: Mild increased interstitial markings, chronic, without frank interstitial edema. No focal pulmonary opacity. No pleural effusion. No pneumothorax. HEART AND MEDIASTINUM: No acute abnormality of the cardiac and mediastinal silhouettes. Thoracic aortic atherosclerosis. BONES AND SOFT TISSUES: No acute osseous abnormality. IMPRESSION: 1. No acute cardiopulmonary process. Electronically signed by: Pinkie Pebbles MD 10/27/2023 12:42 AM EDT RP Workstation: [id number]   CT HEAD CODE STROKE WO CONTRAST Result Date: 10/27/2023 CLINICAL DATA:  Code stroke.  Neuro deficit, acute, stroke suspected EXAM: CT HEAD WITHOUT CONTRAST TECHNIQUE: Contiguous axial images were obtained from the base of the skull through the vertex without intravenous contrast. RADIATION DOSE REDUCTION: This exam was performed according to the departmental dose-optimization program which includes automated exposure control, adjustment of the mA and/or kV according to patient size and/or use of iterative reconstruction technique. COMPARISON:  CT head 09/29/2023. FINDINGS: Brain: No evidence of acute infarction, hemorrhage, hydrocephalus, extra-axial collection or mass lesion/mass effect. Moderate patchy white matter hypodensities are nonspecific but compatible with chronic microvascular disease that appears similar to prior. Vascular: No hyperdense vessel. Skull: No acute fracture. 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 ASPECTS [facility] Stroke Program Early CT Score) Total score (0-10 with 10 being normal): 10. IMPRESSION: 1. No evidence of acute intracranial abnormality.  ASPECTS is 10. 2. 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 Recommend correlation with direct inspection. Code stroke imaging results were communicated on 10/27/2023 at 12:31 am to provider Dr. Vanessa via  secure text paging. Electronically Signed   By: Gilmore GORMAN Molt M.D.   On: 10/27/2023 00:32      Vernal Alstrom, MD  [facility] 10/28/2023  If 7PM-7AM, please contact night-coverage

## 2023-10-28 NOTE — Evaluation (Signed)
 Physical Therapy Evaluation Patient Details Name: Michelle Hodges MRN: [id number] DOB: 21-Jan-1946 Today's Date: 10/28/2023  History of Present Illness  78 y.o. female presents to [facility] 10/27/23 with worsening R sided weakness, slurred speech and confusion. CT head negative, neurological symptoms thought to be 2/2 recrudescence of old stroke. Workup suggested severe sepsis 2/2 UTI. PMHx: hypertension, MS, dementia, hypercholesterolemia, PVD, and prior CVA with residual right-sided weakness   Clinical Impression  Pt in bed upon arrival and agreeable to PT eval. PTA, pt was ModI with use of rollator or SP cane. Pt presents close to functional mobility baseline with generalized weakness, impaired balance/gait, and decreased safety awareness. In today's session, pt required MinA for bed mobility and MinA to stand with use of RW. Pt with slight posterior lean in sitting and with initial stand. Pt was then able to ambulate 11ft with CGA and RW with frequent cueing to improve gait pattern. Pt was easily distracted by the environment with ability to follow one cue at a time. Pt has 24/7 level of assist available at home. Recommending post-acute HHPT to work towards independence with mobility. Pt would benefit from acute skilled PT with current functional limitations listed below (see PT Problem List). Acute PT to follow.         If plan is discharge home, recommend the following: A little help with walking and/or transfers;A little help with bathing/dressing/bathroom;Assist for transportation;Help with stairs or ramp for entrance   Can travel by private vehicle    Yes    Equipment Recommendations Rolling walker (2 wheels)     Functional Status Assessment Patient has had a recent decline in their functional status and demonstrates the ability to make significant improvements in function in a reasonable and predictable amount of time.     Precautions / Restrictions Precautions Precautions: Fall Recall  of Precautions/Restrictions: Impaired Restrictions Weight Bearing Restrictions Per Provider Order: No      Mobility  Bed Mobility Overal bed mobility: Needs Assistance Bed Mobility: Supine to Sit    Supine to sit: Min assist    General bed mobility comments: MinA to complete moving LE's off EOB. Cues to scoot hips forward    Transfers Overall transfer level: Needs assistance Equipment used: Rolling walker (2 wheels) Transfers: Sit to/from Stand Sit to Stand: Min assist    General transfer comment: MinA for slight boost-up, slight posterior lean with rise.    Ambulation/Gait Ambulation/Gait assistance: Contact guard assist Gait Distance (Feet): 160 Feet Assistive device: Rolling walker (2 wheels) Gait Pattern/deviations: Step-through pattern, Decreased stride length, Narrow base of support, Trunk flexed, Shuffle Gait velocity: decr     General Gait Details: narrow BOS with cues for increased step length and step width. Cues to stay inside RW and for upright posture. Slow and wide turns    Balance Overall balance assessment: Mild deficits observed, not formally tested, Needs assistance Sitting-balance support: Bilateral upper extremity supported, Feet supported Sitting balance-Leahy Scale: Fair Sitting balance - Comments: posterior lean with dynamic activities with MinA needed to maintain balance Postural control: Posterior lean Standing balance support: Bilateral upper extremity supported, During functional activity, Reliant on assistive device for balance Standing balance-Leahy Scale: Poor Standing balance comment: slight posterior lean upon standing, MinA initially with progression to CGA        Pertinent Vitals/Pain Pain Assessment Pain Assessment: No/denies pain    Home Living Family/patient expects to be discharged to:: Private residence Living Arrangements: Children (daughter) Available Help at Discharge: Family;Available 24 hours/day Type of  Home:  House Home Access: Level entry    Home Layout: One level Home Equipment: Rollator (4 wheels);Shower seat;Grab bars - tub/shower;Other (comment);Cane - single point      Prior Function Prior Level of Function : Needs assist    Mobility Comments: Ind with rollator or cane. Was receiving HHPT ADLs Comments: pt reports being independent with ADLs and to get in the tub. Daughter assists as needed for dressing     Extremity/Trunk Assessment   Upper Extremity Assessment Upper Extremity Assessment: Defer to OT evaluation    Lower Extremity Assessment Lower Extremity Assessment: Generalized weakness    Cervical / Trunk Assessment Cervical / Trunk Assessment: Other exceptions (R scapular winging)  Communication   Communication Communication: No apparent difficulties    Cognition Arousal: Alert Behavior During Therapy: WFL for tasks assessed/performed   PT - Cognitive impairments: History of cognitive impairments    PT - Cognition Comments: Easily distracted by the  enviroment. Able to focus on one cue at a time Following commands: Impaired Following commands impaired: Only follows one step commands consistently, Follows multi-step commands inconsistently     Cueing Cueing Techniques: Verbal cues, Tactile cues     General Comments General comments (skin integrity, edema, etc.): Daughter present during eval and supportive     PT Assessment Patient needs continued PT services  PT Problem List Decreased strength;Decreased activity tolerance;Decreased mobility;Decreased balance       PT Treatment Interventions DME instruction;Gait training;Functional mobility training;Therapeutic activities;Therapeutic exercise;Balance training;Neuromuscular re-education;Patient/family education    PT Goals (Current goals can be found in the Care Plan section)  Acute Rehab PT Goals Patient Stated Goal: to continue working with physical therapy PT Goal Formulation: With patient/family Time  For Goal Achievement: 11/11/23 Potential to Achieve Goals: Good    Frequency Min 1X/week     Co-evaluation   Reason for Co-Treatment: For patient/therapist safety;To address functional/ADL transfers PT goals addressed during session: Mobility/safety with mobility;Balance;Proper use of DME         AM-PAC PT 6 Clicks Mobility  Outcome Measure Help needed turning from your back to your side while in a flat bed without using bedrails?: None Help needed moving from lying on your back to sitting on the side of a flat bed without using bedrails?: A Little Help needed moving to and from a bed to a chair (including a wheelchair)?: A Little Help needed standing up from a chair using your arms (e.g., wheelchair or bedside chair)?: A Little Help needed to walk in hospital room?: A Little Help needed climbing 3-5 steps with a railing? : A Little 6 Click Score: 19    End of Session Equipment Utilized During Treatment: Gait belt Activity Tolerance: Patient tolerated treatment well Patient left: in chair;with call bell/phone within reach;with chair alarm set;with family/visitor present Nurse Communication: Mobility status PT Visit Diagnosis: Unsteadiness on feet (R26.81);Other abnormalities of gait and mobility (R26.89);Muscle weakness (generalized) (M62.81);History of falling (Z91.81)    Time: 8897-8873 PT Time Calculation (min) (ACUTE ONLY): 24 min   Charges:   PT Evaluation $PT Eval Low Complexity: 1 Low   PT General Charges $$ ACUTE PT VISIT: 1 Visit       Kate ORN, PT, DPT Secure Chat Preferred  Rehab Office [phone]   Kate BRAVO Wendolyn 10/28/2023, 12:07 PM

## 2023-10-28 NOTE — Hospital Course (Signed)
 Michelle Hodges is a 78 y.o. year old female with past medical history of hypertension, multiple sclerosis not on medications, dementia, hypercholesterolemia, PVD, and prior CVA with residual right-sided weakness on Plavix  presented to the hospital with code stroke.. Around 78 PM patient's daughter assisted patient off of the commode and noted worsened right-sided weakness, slurred speech, and confusion.  Reported dysuria, tingling with urination and urinary frequency for a month.  In the ED patient was febrile with a temperature of 101.5 F.,  Tachycardic, tachypneic and was saturating 90% on room air.  Labs were notable for WBC elevated at 16.2 with lactic acid elevation at 2.0.  Urinalysis showed trace leukocytes.  COVID influenza and RSV was negative.  Neurology was consulted and felt that symptoms of previous stroke where worsened due to infectious process.  CT head scan without any acute abnormality.  Chest x-ray did not show any infiltrate.  CT scan of the abdomen pelvis showed thickened bladder consistent with cystitis.  Patient was then admitted hospital for further evaluation and treatment.  Severe Sepsis secondary to Acute Cystitis Continue IV Rocephin .  Received IV fluids.  atest temperature of 97.7 from temperature max of 101.5 F.  Lactate was initially elevated at 2.0 which has normalized at this time..  Leukocytosis slightly trended down.  Follow urine cultures pending, negative blood culture in 1 day.    Acute Metabolic Encephalopathy secondary to cystitis. New stroke has been ruled out.  Continue delirium precautions.   Prior CVA with residual right-sided weakness  Recent TIA Hospitalized 8/26-8/27/25 for the same.  Continue Plavix  and Lipitor.  Fall precautions.  Essential hypertension On as needed hydralazine .  Amlodipine  on hold.   #Normocytic Anemia No signs of bleeding.  Anemia panel with ferritin of 143 and iron low at 13.

## 2023-10-29 ENCOUNTER — Encounter: Payer: Medicare Other | Admitting: Nurse Practitioner

## 2023-10-29 ENCOUNTER — Other Ambulatory Visit (HOSPITAL_COMMUNITY): Payer: Self-pay

## 2023-10-29 LAB — CBC
HCT: 34 % — ABNORMAL LOW (ref 36.0–46.0)
Hemoglobin: 10.9 g/dL — ABNORMAL LOW (ref 12.0–15.0)
MCH: 29.5 pg (ref 26.0–34.0)
MCHC: 32.1 g/dL (ref 30.0–36.0)
MCV: 91.9 fL (ref 80.0–100.0)
Platelets: 297 K/uL (ref 150–400)
RBC: 3.7 MIL/uL — ABNORMAL LOW (ref 3.87–5.11)
RDW: 14 % (ref 11.5–15.5)
WBC: 9.1 K/uL (ref 4.0–10.5)
nRBC: 0 % (ref 0.0–0.2)

## 2023-10-29 LAB — BASIC METABOLIC PANEL WITH GFR
Anion gap: 10 (ref 5–15)
BUN: 14 mg/dL (ref 8–23)
CO2: 26 mmol/L (ref 22–32)
Calcium: 9.3 mg/dL (ref 8.9–10.3)
Chloride: 107 mmol/L (ref 98–111)
Creatinine, Ser: 0.89 mg/dL (ref 0.44–1.00)
GFR, Estimated: >60 mL/min (ref >60)
Glucose, Bld: 95 mg/dL (ref 70–99)
Potassium: 3.8 mmol/L (ref 3.5–5.1)
Sodium: 143 mmol/L (ref 135–145)

## 2023-10-29 LAB — URINE CULTURE: Culture: >=100000 — AB

## 2023-10-29 LAB — PHOSPHORUS: Phosphorus: 3.7 mg/dL (ref 2.5–4.6)

## 2023-10-29 LAB — MAGNESIUM: Magnesium: 2.1 mg/dL (ref 1.7–2.4)

## 2023-10-29 MED ORDER — AMOXICILLIN-POT CLAVULANATE 875-125 MG PO TABS
1.0000 | ORAL_TABLET | Freq: Two times a day (BID) | ORAL | Status: DC
  Administered 2023-10-29: 1 via ORAL
  Filled 2023-10-29: qty 1

## 2023-10-29 MED ORDER — MELATONIN 5 MG PO TABS
5.0000 mg | ORAL_TABLET | Freq: Every day | ORAL | 0 refills | Status: DC
  Filled 2023-10-29: qty 7, 7d supply, fill #0

## 2023-10-29 MED ORDER — AMOXICILLIN-POT CLAVULANATE 875-125 MG PO TABS: 1.0000 | ORAL_TABLET | Freq: Two times a day (BID) | ORAL | Status: DC

## 2023-10-29 MED ORDER — BACLOFEN 5 MG PO TABS
5.0000 mg | ORAL_TABLET | Freq: Three times a day (TID) | ORAL | 0 refills | Status: AC | PRN
  Filled 2023-10-29: qty 21, 7d supply, fill #0

## 2023-10-29 MED ORDER — AMOXICILLIN-POT CLAVULANATE 875-125 MG PO TABS
1.0000 | ORAL_TABLET | Freq: Two times a day (BID) | ORAL | 0 refills | Status: DC
  Filled 2023-10-29: qty 7, 4d supply, fill #0

## 2023-10-29 NOTE — Plan of Care (Signed)
  Problem: Fluid Volume: Goal: Hemodynamic stability will improve Outcome: Adequate for Discharge   Problem: Clinical Measurements: Goal: Diagnostic test results will improve Outcome: Adequate for Discharge Goal: Signs and symptoms of infection will decrease Outcome: Adequate for Discharge   Problem: Respiratory: Goal: Ability to maintain adequate ventilation will improve Outcome: Adequate for Discharge   Problem: Education: Goal: Knowledge of General Education information will improve Description Including pain rating scale, medication(s)/side effects and non-pharmacologic comfort measures Outcome: Adequate for Discharge   Problem: Health Behavior/Discharge Planning: Goal: Ability to manage health-related needs will improve Outcome: Adequate for Discharge   Problem: Clinical Measurements: Goal: Ability to maintain clinical measurements within normal limits will improve Outcome: Adequate for Discharge Goal: Will remain free from infection Outcome: Adequate for Discharge Goal: Diagnostic test results will improve Outcome: Adequate for Discharge Goal: Respiratory complications will improve Outcome: Adequate for Discharge Goal: Cardiovascular complication will be avoided Outcome: Adequate for Discharge   Problem: Activity: Goal: Risk for activity intolerance will decrease Outcome: Adequate for Discharge

## 2023-10-29 NOTE — Progress Notes (Signed)
 This encounter was created in error - please disregard.

## 2023-10-29 NOTE — Progress Notes (Signed)
 This service is provided via telemedicine  No vital signs collected/recorded due to the encounter was a telemedicine visit.   Location of patient (ex: home, work):  Home  Patient consents to a telephone visit:  Yes  Location of the provider (ex: office, home):  Office [place].   Name of any referring provider:  na  Names of all persons participating in the telemedicine service and their role in the encounter:  bentli, llorente Michelle Hodges, CMA, [name], NP  Time spent on call:  7:10

## 2023-10-29 NOTE — Discharge Summary (Signed)
 Physician Discharge Summary   Patient: Michelle Hodges MRN: [id number] DOB: [birth year]  Admit date:     10/27/2023  Discharge date: 10/29/23  Discharge Physician: Michelle Hodges   PCP: Michelle Harlene POUR, NP   Recommendations at discharge:    Follow up with PCP in 7 days   Discharge Diagnoses: Principal Problem:   Severe sepsis (HCC) Active Problems:   Essential hypertension   History of CVA (cerebrovascular accident)   MS (multiple sclerosis)   Dementia without behavioral disturbance (HCC)   Acute metabolic encephalopathy   Cystitis   Normocytic anemia  Resolved Problems:   * No resolved hospital problems. *  Hospital Course: Michelle Hodges is a 78 y.o. year old female with past medical history of hypertension, multiple sclerosis not on medications, dementia, hypercholesterolemia, PVD, and prior CVA with residual right-sided weakness on Plavix  presented to the hospital with code stroke. Around 16 PM patient's daughter assisted patient off of the commode and noted worsened right-sided weakness, slurred speech, and confusion.  Reported dysuria, tingling with urination and urinary frequency for a month.  In the ED patient was febrile with a temperature of 101.5 F.,  Tachycardic, tachypneic and was saturating 90% on room air.  Labs were notable for WBC elevated at 16.2 with lactic acid elevation at 2.0.  Urinalysis showed trace leukocytes.  COVID influenza and RSV was negative.  Neurology was consulted and felt that symptoms of previous stroke where worsened due to infectious process.  CT head scan without any acute abnormality.  Chest x-ray did not show any infiltrate.  CT scan of the abdomen pelvis showed thickened bladder consistent with cystitis.  Patient was then admitted hospital for further evaluation and treatment.  Severe Sepsis secondary to E. coli Cystitis Patient was treated with IV fluid resuscitation and 3 days of IV Rocephin .  Vital signs improved with resolution of  fever.  Lactate which was initially elevated to 2 point normalized.  Leukocytosis resolved.  Urine cultures were positive for pansensitive E. coli.  Blood cultures were negative at 2 days.  Patient clinical status was overall improved.  Antibiotics were revised to p.o. Augmentin  to complete a 7-day course.  Acute Metabolic Encephalopathy secondary to cystitis. New stroke was ruled out.  Patient mental status improved but daughter describes some residual cognitive deficits that she would address with patient's PCP.   Prior CVA with residual right-sided weakness  Recent TIA Hospitalized 8/26-8/27/25 for the same.  Patient was placed on fall precautions and home Plavix  and Lipitor were continued. Home health PT services and a rolling walker were arranged for the patient at the time of discharge due to her worsening right-sided weakness and overall deconditioning.   Essential hypertension Home amlodipine  was resumed on discharge.   #Normocytic Anemia No signs of bleeding.  Anemia panel with ferritin of 143 and iron low at 13.  The patient and the patient's daughter, Michelle Hodges, were present at the time of discussion of the plan for discharge. They expressed understanding and were in agreement with the plan as stated.       Consultants:None Procedures performed: None  Disposition: Home health Diet recommendation:  Regular diet DISCHARGE MEDICATION: Allergies as of 10/29/2023   No Known Allergies      Medication List     TAKE these medications    acetaminophen  500 MG tablet Commonly known as: TYLENOL  Take 500 mg by mouth every 6 (six) hours as needed for mild pain (pain score 1-3) or headache.   amLODipine  5  MG tablet Commonly known as: NORVASC  TAKE 1 TABLET (5 MG TOTAL) BY MOUTH DAILY.   amoxicillin -clavulanate 875-125 MG tablet Commonly known as: AUGMENTIN  Take 1 tablet by mouth every 12 (twelve) hours.   Baclofen  5 MG Tabs Take 1 tablet (5 mg total) by mouth 3 (three) times  daily as needed for up to 7 days for muscle spasms.   clopidogrel  75 MG tablet Commonly known as: PLAVIX  Take 1 tablet (75 mg total) by mouth daily.   HOMEOPATHIC PRODUCTS EX Apply 1 Application topically as needed (joint and arthritic pain). Mixture of clove oil and jojoba oil, and emollient .   MAGNESIUM  BISGLYCINATE PO Take 200 mg by mouth at bedtime.   melatonin 5 MG Tabs Take 1 tablet (5 mg total) by mouth at bedtime for 7 days.   rosuvastatin  10 MG tablet Commonly known as: Crestor  Take 1 tablet (10 mg total) by mouth daily.   VITAMIN K2-VITAMIN D3 PO Take 1 tablet by mouth daily.               Durable Medical Equipment  (From admission, onward)           Start     Ordered   10/29/23 0952  For home use only DME Walker rolling  Once       Question Answer Comment  Walker: With 5 Inch Wheels   Patient needs a walker to treat with the following condition Generalized weakness      10/29/23 0955            Follow-up Information     [facility] Follow up.   Specialty: Home Health Services Why: resume [facility] services for PT/OT- they will contact you to schedule Contact information: [address] [place] [address] [phone]         [employer] Follow up.   Why: rolling walker arranged- they will deliver prior to discharge Contact information: [address] [place] [address] [phone]                Discharge Exam: Michelle Hodges   10/27/23 0020  Weight: 59 kg    Condition at discharge: good  The results of significant diagnostics from this hospitalization (including imaging, microbiology, ancillary and laboratory) are listed below for reference.   Imaging Studies: CT ABDOMEN PELVIS W CONTRAST Result Date: 10/27/2023 EXAM: CT ABDOMEN AND PELVIS WITH CONTRAST 10/27/2023 03:44:35 AM TECHNIQUE: CT of the abdomen and pelvis was performed with the administration of intravenous contrast.  Multiplanar reformatted images are provided for review. Automated exposure control, iterative reconstruction, and/or weight-based adjustment of the mA/kV was utilized to reduce the radiation dose to as low as reasonably achievable. COMPARISON: 04/17/2012 CLINICAL HISTORY: Abdominal pain, acute, nonlocalized. Bladder infection. FINDINGS: LOWER CHEST: Mild bibasilar atelectasis. LIVER: The liver is unremarkable. GALLBLADDER AND BILE DUCTS: Gallbladder is unremarkable. No biliary ductal dilatation. SPLEEN: No acute abnormality. PANCREAS: No acute abnormality. ADRENAL GLANDS: No acute abnormality. KIDNEYS, URETERS AND BLADDER: No stones in the kidneys or ureters. No hydronephrosis. No perinephric or periureteral stranding. Mildly thick walled bladder, correlate for cystitis. GI AND BOWEL: Stomach demonstrates no acute abnormality. There is no bowel obstruction. Moderate colonic stool burden, suggesting constipation. PERITONEUM AND RETROPERITONEUM: No ascites. No free air. VASCULATURE: Atherosclerotic calcifications of the abdominal aorta and branch vessels, although patent. LYMPH NODES: No lymphadenopathy. REPRODUCTIVE ORGANS: Uterus is unremarkable. BONES AND SOFT TISSUES: No acute osseous abnormality. No focal soft tissue abnormality. IMPRESSION: 1. Mildly thick-walled  bladder, correlate for cystitis. 2. Otherwise negative. Electronically signed by: Pinkie Pebbles MD 10/27/2023 03:53 AM EDT RP Workstation: [id number]   DG Chest Port 1 View Result Date: 10/27/2023 EXAM: 1 VIEW XRAY OF THE CHEST 10/27/2023 12:36:00 AM COMPARISON: 09/29/2023 CLINICAL HISTORY: Questionable sepsis - evaluate for abnormality. Table formatting from the original note was not included. Images from the original note were not included. Arrives [facility] from home as an activated Code Stroke. 2300 went to restroom as she's had urinary frequency and ~2315 was found with increased weakness of right arm and leg. Slow slurred speech and alert/  oriented x 3. Recently started Plavix  after a TIA. FINDINGS: LUNGS AND PLEURA: Mild increased interstitial markings, chronic, without frank interstitial edema. No focal pulmonary opacity. No pleural effusion. No pneumothorax. HEART AND MEDIASTINUM: No acute abnormality of the cardiac and mediastinal silhouettes. Thoracic aortic atherosclerosis. BONES AND SOFT TISSUES: No acute osseous abnormality. IMPRESSION: 1. No acute cardiopulmonary process. Electronically signed by: Pinkie Pebbles MD 10/27/2023 12:42 AM EDT RP Workstation: [id number]   CT HEAD CODE STROKE WO CONTRAST Result Date: 10/27/2023 CLINICAL DATA:  Code stroke.  Neuro deficit, acute, stroke suspected EXAM: CT HEAD WITHOUT CONTRAST TECHNIQUE: Contiguous axial images were obtained from the base of the skull through the vertex without intravenous contrast. RADIATION DOSE REDUCTION: This exam was performed according to the departmental dose-optimization program which includes automated exposure control, adjustment of the mA and/or kV according to patient size and/or use of iterative reconstruction technique. COMPARISON:  CT head 09/29/2023. FINDINGS: Brain: No evidence of acute infarction, hemorrhage, hydrocephalus, extra-axial collection or mass lesion/mass effect. Moderate patchy white matter hypodensities are nonspecific but compatible with chronic microvascular disease that appears similar to prior. Vascular: No hyperdense vessel. Skull: No acute fracture. 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 ASPECTS [facility] Stroke Program Early CT Score) Total score (0-10 with 10 being normal): 10. IMPRESSION: 1. No evidence of acute intracranial abnormality.  ASPECTS is 10. 2. 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 Recommend correlation with direct inspection. Code stroke imaging results were communicated on 10/27/2023 at 12:31 am to provider Dr. Vanessa via secure text paging. Electronically Signed   By: Gilmore GORMAN Molt M.D.   On:  10/27/2023 00:32   MM 3D SCREENING MAMMOGRAM BILATERAL BREAST Result Date: 10/20/2023 CLINICAL DATA:  Screening. EXAM: DIGITAL SCREENING BILATERAL MAMMOGRAM WITH TOMOSYNTHESIS AND CAD TECHNIQUE: Bilateral screening digital craniocaudal and mediolateral oblique mammograms were obtained. Bilateral screening digital breast tomosynthesis was performed. The images were evaluated with computer-aided detection. COMPARISON:  Previous exam(s). ACR Breast Density Category c: The breasts are heterogeneously dense, which may obscure small masses. FINDINGS: There are no findings suspicious for malignancy. IMPRESSION: No mammographic evidence of malignancy. A result letter of this screening mammogram will be mailed directly to the patient. RECOMMENDATION: Screening mammogram in one year. (Code:[id number]-B-01Y) BI-RADS CATEGORY  1: Negative. Electronically Signed   By: Debby Satterfield M.D.   On: 10/20/2023 13:02   DG CHEST PORT 1 VIEW Result Date: 09/29/2023 CLINICAL DATA:  Transient ischemic attack. Dementia and multiple sclerosis. EXAM: PORTABLE CHEST 1 VIEW COMPARISON:  09/25/2006 FINDINGS: Atherosclerotic calcification of the aortic arch. The patient is rotated to the left on today's radiograph, reducing diagnostic sensitivity and specificity. Heart size within normal limits. The lungs appear grossly clear. No blunting of the costophrenic angles. IMPRESSION: 1. No acute findings. 2. Aortic Atherosclerosis (ICD10-I70.0). Electronically Signed   By: Ryan Salvage M.D.   On: 09/29/2023 18:36    Microbiology: Results for  orders placed or performed during the hospital encounter of 10/27/23  Urine Culture     Status: Abnormal   Collection Time: 10/27/23 12:51 AM   Specimen: Urine, Clean Catch  Result Value Ref Range Status   Specimen Description URINE, CLEAN CATCH  Final   Special Requests   Final    NONE Performed at [facility], [address] [place], [place] [address]    Culture >=100,000 COLONIES/mL  ESCHERICHIA COLI (A)  Final   Report Status 10/29/2023 FINAL  Final   Organism ID, Bacteria ESCHERICHIA COLI (A)  Final      Susceptibility   Escherichia coli - MIC*    AMPICILLIN <=2 SENSITIVE Sensitive     CEFAZOLIN (URINE) Value in next row Sensitive      <=1 SENSITIVEThis is a modified FDA-approved test that has been validated and its performance characteristics determined by the reporting laboratory.  This laboratory is certified under the Clinical Laboratory Improvement Amendments CLIA as qualified to perform high complexity clinical laboratory testing.    CEFEPIME Value in next row Sensitive      <=1 SENSITIVEThis is a modified FDA-approved test that has been validated and its performance characteristics determined by the reporting laboratory.  This laboratory is certified under the Clinical Laboratory Improvement Amendments CLIA as qualified to perform high complexity clinical laboratory testing.    ERTAPENEM Value in next row Sensitive      <=1 SENSITIVEThis is a modified FDA-approved test that has been validated and its performance characteristics determined by the reporting laboratory.  This laboratory is certified under the Clinical Laboratory Improvement Amendments CLIA as qualified to perform high complexity clinical laboratory testing.    CEFTRIAXONE  Value in next row Sensitive      <=1 SENSITIVEThis is a modified FDA-approved test that has been validated and its performance characteristics determined by the reporting laboratory.  This laboratory is certified under the Clinical Laboratory Improvement Amendments CLIA as qualified to perform high complexity clinical laboratory testing.    CIPROFLOXACIN Value in next row Sensitive      <=1 SENSITIVEThis is a modified FDA-approved test that has been validated and its performance characteristics determined by the reporting laboratory.  This laboratory is certified under the Clinical Laboratory Improvement Amendments CLIA as qualified to  perform high complexity clinical laboratory testing.    GENTAMICIN Value in next row Sensitive      <=1 SENSITIVEThis is a modified FDA-approved test that has been validated and its performance characteristics determined by the reporting laboratory.  This laboratory is certified under the Clinical Laboratory Improvement Amendments CLIA as qualified to perform high complexity clinical laboratory testing.    NITROFURANTOIN Value in next row Sensitive      <=1 SENSITIVEThis is a modified FDA-approved test that has been validated and its performance characteristics determined by the reporting laboratory.  This laboratory is certified under the Clinical Laboratory Improvement Amendments CLIA as qualified to perform high complexity clinical laboratory testing.    TRIMETH/SULFA Value in next row Sensitive      <=1 SENSITIVEThis is a modified FDA-approved test that has been validated and its performance characteristics determined by the reporting laboratory.  This laboratory is certified under the Clinical Laboratory Improvement Amendments CLIA as qualified to perform high complexity clinical laboratory testing.    AMPICILLIN/SULBACTAM Value in next row Sensitive      <=1 SENSITIVEThis is a modified FDA-approved test that has been validated and its performance characteristics determined by the reporting laboratory.  This laboratory  is certified under the Clinical Laboratory Improvement Amendments CLIA as qualified to perform high complexity clinical laboratory testing.    PIP/TAZO Value in next row Sensitive      <=4 SENSITIVEThis is a modified FDA-approved test that has been validated and its performance characteristics determined by the reporting laboratory.  This laboratory is certified under the Clinical Laboratory Improvement Amendments CLIA as qualified to perform high complexity clinical laboratory testing.    MEROPENEM Value in next row Sensitive      <=4 SENSITIVEThis is a modified FDA-approved test  that has been validated and its performance characteristics determined by the reporting laboratory.  This laboratory is certified under the Clinical Laboratory Improvement Amendments CLIA as qualified to perform high complexity clinical laboratory testing.    * >=100,000 COLONIES/mL ESCHERICHIA COLI  Blood Culture (routine x 2)     Status: None (Preliminary result)   Collection Time: 10/27/23 12:54 AM   Specimen: BLOOD RIGHT ARM  Result Value Ref Range Status   Specimen Description BLOOD RIGHT ARM  Final   Special Requests   Final    BOTTLES DRAWN AEROBIC AND ANAEROBIC Blood Culture results may not be optimal due to an inadequate volume of blood received in culture bottles   Culture   Final    NO GROWTH 2 DAYS Performed at [facility], [address], [place], [place] [address]    Report Status PENDING  Incomplete  Blood Culture (routine x 2)     Status: None (Preliminary result)   Collection Time: 10/27/23 12:54 AM   Specimen: BLOOD RIGHT ARM  Result Value Ref Range Status   Specimen Description BLOOD RIGHT ARM  Final   Special Requests   Final    BOTTLES DRAWN AEROBIC AND ANAEROBIC Blood Culture results may not be optimal due to an inadequate volume of blood received in culture bottles   Culture   Final    NO GROWTH 2 DAYS Performed at [facility], [address] [place], [place] [address]    Report Status PENDING  Incomplete  Resp panel by RT-PCR (RSV, Flu A&B, Covid) Anterior Nasal Swab     Status: None   Collection Time: 10/27/23 12:54 AM   Specimen: Anterior Nasal Swab  Result Value Ref Range Status   SARS Coronavirus 2 by RT PCR NEGATIVE NEGATIVE Final   Influenza A by PCR NEGATIVE NEGATIVE Final   Influenza B by PCR NEGATIVE NEGATIVE Final    Comment: (NOTE) The Xpert Xpress SARS-CoV-2/FLU/RSV plus assay is intended as an aid in the diagnosis of influenza from Nasopharyngeal swab specimens and should not be used as a sole basis for treatment. Nasal washings  and aspirates are unacceptable for Xpert Xpress SARS-CoV-2/FLU/RSV testing.  Fact Sheet for Patients: BloggerCourse.com  Fact Sheet for Healthcare Providers: SeriousBroker.it  This test is not yet approved or cleared by the [place]  FDA and has been authorized for detection and/or diagnosis of SARS-CoV-2 by FDA under an Emergency Use Authorization (EUA). This EUA will remain in effect (meaning this test can be used) for the duration of the COVID-19 declaration under Section 564(b)(1) of the Act, 21 U.S.C. section 360bbb-3(b)(1), unless the authorization is terminated or revoked.     Resp Syncytial Virus by PCR NEGATIVE NEGATIVE Final    Comment: (NOTE) Fact Sheet for Patients: BloggerCourse.com  Fact Sheet for Healthcare Providers: SeriousBroker.it  This test is not yet approved or cleared by the [place]  FDA and has been authorized for detection and/or  diagnosis of SARS-CoV-2 by FDA under an Emergency Use Authorization (EUA). This EUA will remain in effect (meaning this test can be used) for the duration of the COVID-19 declaration under Section 564(b)(1) of the Act, 21 U.S.C. section 360bbb-3(b)(1), unless the authorization is terminated or revoked.  Performed at [facility], [address], [place], [place] [address]     Labs: CBC: Recent Labs  Lab 10/27/23 0019 10/27/23 0500 10/28/23 0526 10/29/23 0631  WBC 16.2* 15.9* 12.6* 9.1  NEUTROABS 14.1* 13.5*  --   --   HGB 10.9*  11.2* 9.8* 10.4* 10.9*  HCT 34.3*  33.0* 30.5* 31.8* 34.0*  MCV 93.2 91.3 90.9 91.9  PLT 283 275 265 297   Basic Metabolic Panel: Recent Labs  Lab 10/27/23 0019 10/27/23 0500 10/28/23 0526 10/29/23 0631  NA 139  141 140 142 143  K 3.9  3.9 3.6 4.0 3.8  CL 105  108 108 109 107  CO2 22 23 25 26   GLUCOSE 132*  135* 121* 105* 95  BUN 13  15 12 14 14   CREATININE  0.95  1.00 0.89 0.99 0.89  CALCIUM  9.4 8.7* 8.9 9.3  MG  --  2.0  --  2.1  PHOS  --   --   --  3.7   Liver Function Tests: Recent Labs  Lab 10/27/23 0019  AST 27  ALT 17  ALKPHOS 77  BILITOT 0.7  PROT 5.8*  ALBUMIN 3.2*   CBG: Recent Labs  Lab 10/27/23 0014  GLUCAP 146*    Discharge time spent: 35 minutes  Signed: Duffy Larch, MD [facility] 10/29/2023

## 2023-10-29 NOTE — Plan of Care (Signed)

## 2023-10-29 NOTE — Progress Notes (Signed)
 Mobility Specialist: Progress Note   10/29/23 1400  Mobility  Activity Ambulated with assistance  Level of Assistance Contact guard assist, steadying assist  Assistive Device Front wheel walker  Distance Ambulated (ft) 250 ft  Activity Response Tolerated well  Mobility Referral Yes  Mobility visit 1 Mobility  Mobility Specialist Start Time (ACUTE ONLY) 1200  Mobility Specialist Stop Time (ACUTE ONLY) 1225  Mobility Specialist Time Calculation (min) (ACUTE ONLY) 25 min    Pt received on EOB, pleasant and agreeable to mobility session. CGA throughout no complaints. Able to recall directions from previous therapy sessions she's had, Push off from the bed, look forward not down, heel-toe walking etc. No complaints. Returned to room without fault. Left in bed with all needs met, call bell in reach.   Michelle Hodges Mobility Specialist Please contact via SecureChat or Rehab office at [phone]

## 2023-10-29 NOTE — TOC Transition Note (Signed)
 Transition of Care (TOC) - Discharge Note Rayfield Gobble RN, BSN Inpatient Care Management Unit 4E- RN Case Manager See Treatment Team for direct phone # [phone] cross coverage  Patient Details  Name: Michelle Hodges MRN: [id number] Date of Birth: Sep 24, 1945  Transition of Care [facility]) CM/SW Contact:  Michelle Rayfield Hurst, RN Phone Number: 10/29/2023, 11:39 AM   Clinical Narrative:    Per MD pt likely discharge home today. Orders placed for [facility] and DME needs.  Noted consult that daughter had questions regarding home care.   TC made to daughter- Michelle Hodges- per conversation discussed transition needs. Michelle Hodges confirmed [facility] agency that has been coming is [facility] and they want to continue services for therapy. She also voiced that pt has rollator at home however PT here recommending RW at this time, Michelle Hodges would like to see if insurance will cover- does not have preference on provider for DME, just would like it delivered here to take home.   Michelle Hodges also had questions for assistance at home w/ household needs such as light cleaning, cooking, bathing, etc. Explained what Medicare and Medicaid coverage for home services is, and discussed possible PCS under Medicaid. Michelle Hodges also asked difference between [facility] and [facility] which was explained and both programs covered under Medicaid. This CM will bring an application for PCS to room for [facility] prior to pt leaving.   Call made to [facility] liaison for resumption of [facility] PT/OT- they will get pt scheduled within 48hr of discharge and contact pt/daughter.   Call made to [employer] liaison for DME need- they will check to see if insurance will cover and deliver RW to room prior to discharge.   No further INPT CM needs noted.  Daughter to transport pt home later today.    Final next level of care: Home w Home Health Services Barriers to Discharge: No Barriers Identified   Patient Goals and CMS Choice Patient states their goals for this hospitalization and  ongoing recovery are:: return home   Choice offered to / list presented to : Adult Children      Discharge Placement               Home w/ [facility]        Discharge Plan and Services Additional resources added to the After Visit Summary for     Discharge Planning Services: CM Consult Post Acute Care Choice: Home Health, Durable Medical Equipment, Resumption of Svcs/PTA Provider          DME Arranged: Vannie rolling DME Agency: [facility] Date DME Agency Contacted: 10/29/23 Time DME Agency Contacted: [phone] Representative spoke with at DME Agency: [facility] Arranged: PT, OT [facility] Agency: [facility] Date [facility] Agency Contacted: 10/29/23 Time HH Agency Contacted: [phone] Representative spoke with at [facility] Agency: [place]  Social Drivers of Health (SDOH) Interventions SDOH Screenings   Food Insecurity: No Food Insecurity (10/27/2023)  Housing: Low Risk  (10/27/2023)  Transportation Needs: No Transportation Needs (10/27/2023)  Utilities: Not At Risk (10/27/2023)  Depression (PHQ2-9): Low Risk  (10/27/2022)  Social Connections: Unknown (10/27/2023)  Tobacco Use: Medium Risk (10/27/2023)     Readmission Risk Interventions    10/29/2023   11:39 AM  Readmission Risk Prevention Plan  Post Dischage Appt Complete  Medication Screening Complete  Transportation Screening Complete

## 2023-10-30 ENCOUNTER — Telehealth: Payer: Self-pay

## 2023-10-30 DIAGNOSIS — Z87891 Personal history of nicotine dependence: Secondary | ICD-10-CM

## 2023-10-30 DIAGNOSIS — I10 Essential (primary) hypertension: Secondary | ICD-10-CM | POA: Diagnosis not present

## 2023-10-30 DIAGNOSIS — Z7902 Long term (current) use of antithrombotics/antiplatelets: Secondary | ICD-10-CM

## 2023-10-30 DIAGNOSIS — G35 Multiple sclerosis: Secondary | ICD-10-CM | POA: Diagnosis not present

## 2023-10-30 DIAGNOSIS — F0390 Unspecified dementia without behavioral disturbance: Secondary | ICD-10-CM | POA: Diagnosis not present

## 2023-10-30 DIAGNOSIS — I69351 Hemiplegia and hemiparesis following cerebral infarction affecting right dominant side: Secondary | ICD-10-CM | POA: Diagnosis not present

## 2023-10-30 DIAGNOSIS — Z7982 Long term (current) use of aspirin: Secondary | ICD-10-CM

## 2023-10-30 DIAGNOSIS — R32 Unspecified urinary incontinence: Secondary | ICD-10-CM

## 2023-10-30 DIAGNOSIS — D72829 Elevated white blood cell count, unspecified: Secondary | ICD-10-CM

## 2023-10-30 DIAGNOSIS — Z556 Problems related to health literacy: Secondary | ICD-10-CM

## 2023-10-30 NOTE — Transitions of Care (Post Inpatient/ED Visit) (Signed)
   10/30/2023  Name: Michelle Hodges MRN: [id number] DOB: 1945/04/19  Today's TOC FU Call Status: Today's TOC FU Call Status:: Unsuccessful Call (2nd Attempt) Unsuccessful Call (2nd Attempt) Date: 10/30/23  Attempted to reach the patient regarding the most recent Inpatient/ED visit.  Follow Up Plan: Additional outreach attempts will be made to reach the patient to complete the Transitions of Care (Post Inpatient/ED visit) call.   Shona Prow RN, CCM [place]  VBCI-Population Health RN Care Manager [phone]

## 2023-10-30 NOTE — Transitions of Care (Post Inpatient/ED Visit) (Signed)
   10/30/2023  Name: LESIELI BRESEE MRN: [id number] DOB: 25-Oct-1945  Today's TOC FU Call Status: Today's TOC FU Call Status:: Unsuccessful Call (1st Attempt) Unsuccessful Call (1st Attempt) Date: 10/30/23  Attempted to reach the patient regarding the most recent Inpatient/ED visit.  Follow Up Plan: Additional outreach attempts will be made to reach the patient to complete the Transitions of Care (Post Inpatient/ED visit) call.   Shona Prow RN, CCM [place]  VBCI-Population Health RN Care Manager [phone]

## 2023-11-01 LAB — CULTURE, BLOOD (ROUTINE X 2)
Culture: NO GROWTH
Culture: NO GROWTH

## 2023-11-02 ENCOUNTER — Inpatient Hospital Stay: Admitting: Nurse Practitioner

## 2023-11-02 ENCOUNTER — Telehealth: Payer: Self-pay

## 2023-11-02 NOTE — Patient Instructions (Signed)
 Visit Information  Thank you for taking time to visit with me today. Please don't hesitate to contact me if I can be of assistance to you before our next scheduled telephone appointment.  Our next appointment is by telephone on 10/10/25t in the AM  Following is a copy of your care plan:   Goals Addressed             This Visit's Progress    VBCI Transitions of Care (TOC) Care Plan       Problems:  Recent Hospitalization for treatment of Admit/Discharge Date   9/23 - 9/25 Michelle Hodges    Primary Diagnosis: Severe sepsis secondary to E. coli Cystitis  Patient resides with her daughter, Michelle Hodges who provides assistance with all patient care as needed - daughter voiced concern regarding severe sepsis diagnosis and reports feeling urine should have been checked with patient's prior admission - daughter's concern was elevated  Goal:  Over the next 30 days, the patient will not experience hospital readmission  Interventions:  Transitions of Care: Doctor Visits  - discussed the importance of doctor visits Communication with [name] re: patients 2 admissions since last seeing PCP and appointment scheduled today, patient completing last dose of antibiotic and daughter request for urine test - Harlene replied back that she will discuss with daughter/patient at today's appointment that was scheduled by care guide Arranged PCP follow-up within 7 days (Care Guide Scheduled) Reviewed Signs and symptoms of infection related to UTI - daughter denies any signs/symptoms at this time  Patient Self Care Activities:  Attend all scheduled provider appointments Call pharmacy for medication refills 3-7 days in advance of running out of medications Call provider office for new concerns or questions  Notify RN Care Manager of [facility] call rescheduling needs Participate in Transition of Care Program/Attend [facility] scheduled calls Take medications as prescribed    Plan:  Telephone follow up appointment with  care management team member scheduled for:  11/13/23 in the AM The patient has been provided with contact information for the care management team and has been advised to call with any health related questions or concerns.         Patient verbalizes understanding of instructions and care plan provided today and agrees to view in MyChart. Active MyChart status and patient understanding of how to access instructions and care plan via MyChart confirmed with patient.     Telephone follow up appointment with care management team member scheduled for: 11/13/23 in the AM The patient has been provided with contact information for the care management team and has been advised to call with any health related questions or concerns.   Please call the care guide team at [phone] if you need to cancel or reschedule your appointment.   Please call the Suicide and Crisis Lifeline: 988 call 911 if you are experiencing a Mental Health or Behavioral Health Crisis or need someone to talk to.  Shona Prow RN, CCM [place]  VBCI-Population Health RN Care Manager [phone]

## 2023-11-02 NOTE — Transitions of Care (Post Inpatient/ED Visit) (Signed)
 11/02/2023  Name: Michelle Hodges MRN: [id number] DOB: July 31, 1945  Today's TOC FU Call Status: Today's TOC FU Call Status:: Successful TOC FU Call Completed TOC FU Call Complete Date: 11/02/23 Patient's Name and Date of Birth confirmed.  Transition Care Management Follow-up Telephone Call How have you been since you were released from the hospital?: Better Any questions or concerns?: Yes Patient Questions/Concerns:: Daugther voiced concern with patient's prior admission from 10/01/23 when she was not tested for UTI - reviewed  Items Reviewed: Did you receive and understand the discharge instructions provided?: Yes Medications obtained,verified, and reconciled?: Yes (Medications Reviewed) Any new allergies since your discharge?: No Dietary orders reviewed?: Yes Type of Diet Ordered:: low sodium heart healthy Do you have support at home?: Yes People in Home [RPT]: child(ren), adult Name of Support/Comfort Primary Source: Patient lives with daughter - has a 2nd daughter  Medications Reviewed Today: Medications Reviewed Today     Reviewed by Lauro Shona LABOR, RN (Registered Nurse) on 11/02/23 at [id number]  Med List Status: <None>   Medication Order Taking? Sig Documenting Provider Last Dose Status Informant  acetaminophen  (TYLENOL ) 500 MG tablet [id number] Yes Take 500 mg by mouth every 6 (six) hours as needed for mild pain (pain score 1-3) or headache. [provider]  Active Child, Pharmacy Records  amLODipine  (NORVASC ) 5 MG tablet [id number] Yes TAKE 1 TABLET (5 MG TOTAL) BY MOUTH DAILY. Caro Harlene POUR, NP  Active Child, Pharmacy Records  amoxicillin -clavulanate (AUGMENTIN ) 875-125 MG tablet [id number] Yes Take 1 tablet by mouth every 12 (twelve) hours. Al-Sultani, Anmar, MD  Active   Baclofen  5 MG TABS [id number] Yes Take 1 tablet (5 mg total) by mouth 3 (three) times daily as needed for up to 7 days for muscle spasms. Al-Sultani, Anmar, MD  Active   clopidogrel  (PLAVIX ) 75 MG  tablet [id number] Yes Take 1 tablet (75 mg total) by mouth daily. Vernon Ranks, MD  Active Child, Pharmacy Records  HOMEOPATHIC PRODUCTS [place] [id number] Yes Apply 1 Application topically as needed (joint and arthritic pain). Mixture of clove oil and jojoba oil, and emollient . [provider]  Active Child, Pharmacy Records  MAGNESIUM  BISGLYCINATE PO [id number]  Take 200 mg by mouth at bedtime.  Patient not taking: Reported on 11/02/2023   [provider]  Active Child, Pharmacy Records  melatonin 5 MG TABS [id number] Yes Take 1 tablet (5 mg total) by mouth at bedtime for 7 days. Al-Sultani, Anmar, MD  Active   rosuvastatin  (CRESTOR ) 10 MG tablet [id number] Yes Take 1 tablet (10 mg total) by mouth daily. Vernon Ranks, MD  Active Child, Pharmacy Records  Vitamin D-Vitamin K (VITAMIN K2-VITAMIN D3 PO) [id number]  Take 1 tablet by mouth daily.  Patient not taking: Reported on 11/02/2023   [provider]  Active Child, Pharmacy Records            Home Care and Equipment/Supplies: Were Home Health Services Ordered?: Yes Name of Home Health Agency:: [facility] Has Agency set up a time to come to your home?: Yes First Home Health Visit Date: 10/31/23 Any new equipment or medical supplies ordered?: Yes Name of Medical supply agency?: Daughter states patient came home with walker Were you able to get the equipment/medical supplies?: Yes Do you have any questions related to the use of the equipment/supplies?: No  Functional Questionnaire: Do you need assistance with bathing/showering or dressing?: No (Daughter assists) Do you need assistance with meal preparation?: No (Daughter states patient can warm food but daughter  prepares food) Do you need assistance with eating?: No Do you have difficulty maintaining continence: Yes (infrequent urinary incontinence with UTI) Do you need assistance with getting out of bed/getting out of a chair/moving?: No (does not need personal  assistance but does use walker) Do you have difficulty managing or taking your medications?: Yes (daughter assists)  Follow up appointments reviewed: PCP Follow-up appointment confirmed?: Yes Date of PCP follow-up appointment?: 11/02/23 Follow-up Provider: [name] Specialist [facility] Follow-up appointment confirmed?: NA Do you need transportation to your follow-up appointment?: No Do you understand care options if your condition(s) worsen?: Yes-patient verbalized understanding  SDOH Interventions Today    Flowsheet Row Most Recent Value  SDOH Interventions   Food Insecurity Interventions Intervention Not Indicated  Housing Interventions Intervention Not Indicated  Transportation Interventions Intervention Not Indicated  Utilities Interventions Intervention Not Indicated    Goals      Patient Stated     To jog again      VBCI Transitions of Care (TOC) Care Plan     Problems:  Recent Hospitalization for treatment of Admit/Discharge Date   9/23 - 9/25 Jolynn Pack    Primary Diagnosis: Severe sepsis secondary to E. coli Cystitis  Patient resides with her daughter, Michelle Hodges who provides assistance with all patient care as needed - daughter voiced concern regarding severe sepsis diagnosis and reports feeling urine should have been checked with patient's prior admission - daughter's concern was elevated  Goal:  Over the next 30 days, the patient will not experience hospital readmission  Interventions:  Transitions of Care: Doctor Visits  - discussed the importance of doctor visits Communication with [name] re: patients 2 admissions since last seeing PCP and appointment scheduled today, patient completing last dose of antibiotic and daughter request for urine test - Harlene replied back that she will discuss with daughter/patient at today's appointment that was scheduled by care guide Arranged PCP follow-up within 7 days (Care Guide Scheduled) Reviewed Signs and symptoms  of infection related to UTI - daughter denies any signs/symptoms at this time  Patient Self Care Activities:  Attend all scheduled provider appointments Call pharmacy for medication refills 3-7 days in advance of running out of medications Call provider office for new concerns or questions  Notify RN Care Manager of [facility] call rescheduling needs Participate in Transition of Care Program/Attend [facility] scheduled calls Take medications as prescribed    Plan:  Telephone follow up appointment with care management team member scheduled for:  11/13/23 in the AM The patient has been provided with contact information for the care management team and has been advised to call with any health related questions or concerns.         Shona Prow RN, CCM [place]  VBCI-Population Health RN Care Manager [phone]

## 2023-11-02 NOTE — Telephone Encounter (Signed)
 Call returned to Michelle Hod[facility] Agency and verbal orders authorized per [employer] standing order

## 2023-11-02 NOTE — Telephone Encounter (Signed)
 Copied from CRM [id number]. Topic: Clinical - Home Health Verbal Orders >> Nov 02, 2023 10:36 AM Graeme ORN wrote: Caller/Agency: [facility] PT Callback Number: [phone] - ok to leave msg secure vm.  Service Requested: Physical Therapy Frequency: Resumption of PT: 1 x per week 1 week, 2 x per week for 4 weeks, 1 x per week for 1 week starting 9/25 Any new concerns about the patient? No - admitted to hospital and discharged

## 2023-11-03 ENCOUNTER — Telehealth: Payer: Self-pay

## 2023-11-03 NOTE — Telephone Encounter (Signed)
 Incoming fax received (sender not specified) for form completion of [id number] Personal Care Services. Form requires notation that patient has had an appointment within the last 90 days.   I called [name], patients daughter and informed her that patient needs an appointment in order for us  to complete form. Appointment scheduled for Monday 11/09/23 with Monina Medina-Vargas, NP

## 2023-11-05 ENCOUNTER — Ambulatory Visit: Admitting: Adult Health

## 2023-11-05 ENCOUNTER — Telehealth: Payer: Self-pay

## 2023-11-05 ENCOUNTER — Encounter: Payer: Self-pay | Admitting: Adult Health

## 2023-11-05 VITALS — BP 132/68 | HR 66 | Temp 97.6°F | Resp 18 | Ht 69.0 in | Wt 116.4 lb

## 2023-11-05 DIAGNOSIS — R6520 Severe sepsis without septic shock: Secondary | ICD-10-CM

## 2023-11-05 DIAGNOSIS — Z8673 Personal history of transient ischemic attack (TIA), and cerebral infarction without residual deficits: Secondary | ICD-10-CM

## 2023-11-05 DIAGNOSIS — G4700 Insomnia, unspecified: Secondary | ICD-10-CM

## 2023-11-05 DIAGNOSIS — R531 Weakness: Secondary | ICD-10-CM

## 2023-11-05 DIAGNOSIS — D509 Iron deficiency anemia, unspecified: Secondary | ICD-10-CM

## 2023-11-05 DIAGNOSIS — A419 Sepsis, unspecified organism: Secondary | ICD-10-CM | POA: Diagnosis not present

## 2023-11-05 DIAGNOSIS — F1090 Alcohol use, unspecified, uncomplicated: Secondary | ICD-10-CM

## 2023-11-05 DIAGNOSIS — I10 Essential (primary) hypertension: Secondary | ICD-10-CM

## 2023-11-05 LAB — POCT URINALYSIS DIPSTICK (MANUAL)
Nitrite, UA: NEGATIVE
Poct Bilirubin: NEGATIVE
Poct Blood: 250 — AB
Poct Glucose: NORMAL mg/dL
Poct Ketones: NEGATIVE
Poct Protein: NEGATIVE mg/dL
Poct Urobilinogen: NORMAL mg/dL
Spec Grav, UA: 1.02 (ref 1.010–1.025)
pH, UA: 7.5 (ref 5.0–8.0)

## 2023-11-05 MED ORDER — MELATONIN 5 MG PO TABS: 5.0000 mg | ORAL_TABLET | Freq: Every day | ORAL | 0 refills | Status: DC

## 2023-11-05 MED ORDER — CLOPIDOGREL BISULFATE 75 MG PO TABS: 75.0000 mg | ORAL_TABLET | Freq: Every day | ORAL | 0 refills | Status: AC

## 2023-11-05 MED ORDER — POLYETHYLENE GLYCOL 3350 17 GM/SCOOP PO POWD: 17.0000 g | Freq: Every day | ORAL | 1 refills | Status: AC

## 2023-11-05 MED ORDER — VITAMIN K2-VITAMIN D3 90-125 MCG PO CAPS: 1.0000 | ORAL_CAPSULE | Freq: Every day | ORAL | 1 refills | Status: AC

## 2023-11-05 MED ORDER — MAGNESIUM BISGLYCINATE 100 MG PO TABS: 200.0000 mg | ORAL_TABLET | Freq: Every day | ORAL | 1 refills | Status: AC

## 2023-11-05 MED ORDER — MELATONIN 5 MG PO TABS: 5.0000 mg | ORAL_TABLET | Freq: Every day | ORAL | 1 refills | Status: AC

## 2023-11-05 MED ORDER — IRON (FERROUS SULFATE) 325 (65 FE) MG PO TABS: 325.0000 mg | ORAL_TABLET | Freq: Every day | ORAL | 1 refills | Status: AC

## 2023-11-05 NOTE — Telephone Encounter (Signed)
 Call returned to [facility] Agency and verbal orders authorized per [employer] standing order

## 2023-11-05 NOTE — Progress Notes (Signed)
 [facility] clinic  Provider:  Jereld Serum DNP  Code Status:  Full Code  Goals of Care:     10/27/2023   12:21 AM  Advanced Directives  Does Patient Have a Medical Advance Directive? No  Would patient like information on creating a medical advance directive? No - Patient declined     Chief Complaint  Patient presents with   Urinary Tract Infection     Form completion and UTI testing(full panel)    Discussed the use of AI scribe software for clinical note transcription with the patient, who gave verbal consent to proceed.  HPI: Patient is a 78 y.o. female seen today for hospitalization follow up. She is accompanied by herdaughter.  Patient was recently hospitalized on 10/27/2023 to 10/29/2023.  She was noted by the daughter to have worsening right-sided weakness, slurred speech and confusion and brought her to the hospital.  Lab workup showed WBC elevated at 16.2 with lactic acid elevation at 2.0.  Urinalysis showed trace leukocytes, COVID influenza and RSV was negative.  Neurology was consulted and felt that symptoms of missed stroke were worsened due to infectious process.  CT head scan without any acute abnormality.  Chest x-ray was negative for infiltrate.  CT scan of the abdomen pelvis showed thickened bladder consistent with cystitis.  She was treated with IV fluid and 3 days of IV Rocephin .  Vital signs improved with resolution of fever.  Lactic acid normalized and leukocytosis resolved.  Antibiotics were revised to oral Augmentin  to complete a 7-day course.  Augmentin  was ordered twice a day and daughter was just giving her once a day. She is requesting for a repeat urinalysis.  She experiences generalized weakness following hospitalization.  She has a history of multiple sclerosis diagnosed in 2014, initially thought to be a stroke. She experiences right-sided weakness and slurred speech. She is currently on Plavix  75 mg daily, started after the TIA in August, and  rosuvastatin  10 mg daily for hyperlipidemia.  She experiences sleep disturbances, averaging about four hours of sleep per night, and has been feeling 'a little sad' since her last hospitalization.  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 She does not smoke and has reduced her alcohol intake since her hospitalization.    Past Medical History:  Diagnosis Date   History of blood transfusion    Hypertension    MS (multiple sclerosis)    Senile dementia (HCC)     Past Surgical History:  Procedure Laterality Date   COLONOSCOPY N/A 04/20/2012   Procedure: COLONOSCOPY;  Surgeon: Gordy CHRISTELLA Starch, MD;  Location: [facility] END[facility]COPY;  Service: Gastroenterology;  Laterality: N/A;   CYSTECTOMY     in HS   TONSILLECTOMY      No Known Allergies  Outpatient Encounter Medications as of 11/05/2023  Medication Sig   acetaminophen  (TYLENOL ) 500 MG tablet Take 500 mg by mouth every 6 (six) hours as needed for mild pain (pain score 1-3) or headache.   amLODipine  (NORVASC ) 5 MG tablet TAKE 1 TABLET (5 MG TOTAL) BY MOUTH DAILY.   Baclofen  5 MG TABS Take 1 tablet (5 mg total) by mouth 3 (three) times daily as needed for up to 7 days for muscle spasms.   HOMEOPATHIC PRODUCTS EX Apply 1 Application topically as needed (joint and arthritic pain). Mixture of clove oil and jojoba oil, and emollient .   Iron, Ferrous Sulfate, 325 (65 Fe) MG TABS Take 325  mg by mouth daily.   polyethylene glycol powder (GLYCOLAX /MIRALAX ) 17 GM/SCOOP powder Take 17 g by mouth daily. Dissolve 1 capful (17g) in 4-8 ounces of liquid and take by mouth daily.   rosuvastatin  (CRESTOR ) 10 MG tablet Take 1 tablet (10 mg total) by mouth daily.   [DISCONTINUED] clopidogrel  (PLAVIX ) 75 MG tablet Take 1 tablet (75 mg total) by mouth daily.   [DISCONTINUED] MAGNESIUM  BISGLYCINATE PO Take 200 mg by mouth at bedtime.   [DISCONTINUED] melatonin 5 MG TABS Take 1 tablet (5 mg total) by  mouth at bedtime for 7 days.   [DISCONTINUED] Vitamin D-Vitamin K (VITAMIN K2-VITAMIN D3 PO) Take 1 tablet by mouth daily.   amoxicillin -clavulanate (AUGMENTIN ) 875-125 MG tablet Take 1 tablet by mouth every 12 (twelve) hours. (Patient not taking: Reported on 11/05/2023)   clopidogrel  (PLAVIX ) 75 MG tablet Take 1 tablet (75 mg total) by mouth daily.   Magnesium  Bisglycinate 100 MG TABS Take 200 mg by mouth at bedtime.   melatonin 5 MG TABS Take 1 tablet (5 mg total) by mouth at bedtime for 7 days.   Vitamin D-Vitamin K (VITAMIN K2-VITAMIN D3) 90-125 MCG CAPS Take 1 capsule by mouth daily.   No facility-administered encounter medications on file as of 11/05/2023.    Review of Systems:  Review of Systems  Constitutional:  Negative for appetite change, chills, fatigue and fever.  HENT:  Negative for congestion, hearing loss, rhinorrhea and sore throat.   Eyes: Negative.   Respiratory:  Negative for cough, shortness of breath and wheezing.   Cardiovascular:  Negative for chest pain, palpitations and leg swelling.  Gastrointestinal:  Negative for abdominal pain, constipation, diarrhea, nausea and vomiting.  Genitourinary:  Negative for dysuria.  Musculoskeletal:  Negative for arthralgias, back pain and myalgias.       Uses walker when ambulating.  Skin:  Negative for color change, rash and wound.  Neurological:  Negative for dizziness, weakness and headaches.  Psychiatric/Behavioral:  Negative for behavioral problems. The patient is not nervous/anxious.     Health Maintenance  Topic Date Due   DTaP/Tdap/Td (1 - Tdap) Never done   Zoster Vaccines- Shingrix (1 of 2) Never done   Pneumococcal Vaccine: 50+ Years (2 of 2 - PCV20 or PCV21) 01/11/2015   Influenza Vaccine  09/04/2023   COVID-19 Vaccine (2 - 2025-26 season) 10/05/2023   Medicare Annual Wellness (AWV)  10/27/2023   DEXA SCAN  Completed   Hepatitis C Screening  Completed   HPV VACCINES  Aged Out   Meningococcal B Vaccine  Aged  Out   Mammogram  Discontinued   Colonoscopy  Discontinued    Physical Exam: Vitals:   11/05/23 1403  BP: 132/68  Pulse: 66  Resp: 18  Temp: 97.6 F (36.4 C)  SpO2: 97%  Weight: 116 lb 6.4 oz (52.8 kg)  Height: 5' 9 (1.753 m)   Body mass index is 17.19 kg/m. Physical Exam Constitutional:      Appearance: Normal appearance.  HENT:     Head: Normocephalic and atraumatic.     Nose: Nose normal.     Mouth/Throat:     Mouth: Mucous membranes are moist.  Eyes:     Conjunctiva/sclera: Conjunctivae normal.  Cardiovascular:     Rate and Rhythm: Normal rate and regular rhythm.  Pulmonary:     Effort: Pulmonary effort is normal.     Breath sounds: Normal breath sounds.  Abdominal:     General: Bowel sounds are normal.     Palpations:  Abdomen is soft.  Musculoskeletal:        General: Normal range of motion.     Cervical back: Normal range of motion.  Skin:    General: Skin is warm and dry.  Neurological:     General: No focal deficit present.     Mental Status: She is alert and oriented to person, place, and time.     Motor: Weakness present.     Comments: Right-sided weakness  Psychiatric:        Mood and Affect: Mood normal.        Behavior: Behavior normal.        Thought Content: Thought content normal.        Judgment: Judgment normal.     Labs reviewed: Basic Metabolic Panel: Recent Labs    10/27/23 0500 10/28/23 0526 10/29/23 0631  NA 140 142 143  K 3.6 4.0 3.8  CL 108 109 107  CO2 23 25 26   GLUCOSE 121* 105* 95  BUN 12 14 14   CREATININE 0.89 0.99 0.89  CALCIUM  8.7* 8.9 9.3  MG 2.0  --  2.1  PHOS  --   --  3.7   Liver Function Tests: Recent Labs    12/31/22 0857 09/29/23 0732 10/27/23 0019  AST 23 35 27  ALT 16 19 17   ALKPHOS  --  53 77  BILITOT 0.8 1.3* 0.7  PROT 7.2 6.9 5.8*  ALBUMIN  --  3.5 3.2*   No results for input(s): LIPASE, AMYLASE in the last 8760 hours. No results for input(s): AMMONIA in the last 8760  hours. CBC: Recent Labs    09/30/23 1034 10/27/23 0019 10/27/23 0500 10/28/23 0526 10/29/23 0631  WBC 21.5* 16.2* 15.9* 12.6* 9.1  NEUTROABS 18.2* 14.1* 13.5*  --   --   HGB 13.0 10.9*  11.2* 9.8* 10.4* 10.9*  HCT 39.9 34.3*  33.0* 30.5* 31.8* 34.0*  MCV 93.0 93.2 91.3 90.9 91.9  PLT 193 283 275 265 297   Lipid Panel: Recent Labs    12/31/22 0857 09/30/23 1034  CHOL 149 172  HDL 62 86  LDLCALC 72 76  TRIG 72 48  CHOLHDL 2.4 2.0   Lab Results  Component Value Date   HGBA1C 5.4 09/29/2023    Procedures since last visit: CT ABDOMEN PELVIS W CONTRAST Result Date: 10/27/2023 EXAM: CT ABDOMEN AND PELVIS WITH CONTRAST 10/27/2023 03:44:35 AM TECHNIQUE: CT of the abdomen and pelvis was performed with the administration of intravenous contrast. Multiplanar reformatted images are provided for review. Automated exposure control, iterative reconstruction, and/or weight-based adjustment of the mA/kV was utilized to reduce the radiation dose to as low as reasonably achievable. COMPARISON: 04/17/2012 CLINICAL HISTORY: Abdominal pain, acute, nonlocalized. Bladder infection. FINDINGS: LOWER CHEST: Mild bibasilar atelectasis. LIVER: The liver is unremarkable. GALLBLADDER AND BILE DUCTS: Gallbladder is unremarkable. No biliary ductal dilatation. SPLEEN: No acute abnormality. PANCREAS: No acute abnormality. ADRENAL GLANDS: No acute abnormality. KIDNEYS, URETERS AND BLADDER: No stones in the kidneys or ureters. No hydronephrosis. No perinephric or periureteral stranding. Mildly thick walled bladder, correlate for cystitis. GI AND BOWEL: Stomach demonstrates no acute abnormality. There is no bowel obstruction. Moderate colonic stool burden, suggesting constipation. PERITONEUM AND RETROPERITONEUM: No ascites. No free air. VASCULATURE: Atherosclerotic calcifications of the abdominal aorta and branch vessels, although patent. LYMPH NODES: No lymphadenopathy. REPRODUCTIVE ORGANS: Uterus is unremarkable.  BONES AND SOFT TISSUES: No acute osseous abnormality. No focal soft tissue abnormality. IMPRESSION: 1. Mildly thick-walled bladder, correlate for cystitis. 2. Otherwise  negative. Electronically signed by: Pinkie Pebbles MD 10/27/2023 03:53 AM EDT RP Workstation: [id number]   DG Chest Port 1 View Result Date: 10/27/2023 EXAM: 1 VIEW XRAY OF THE CHEST 10/27/2023 12:36:00 AM COMPARISON: 09/29/2023 CLINICAL HISTORY: Questionable sepsis - evaluate for abnormality. Table formatting from the original note was not included. Images from the original note were not included. Arrives [facility] from home as an activated Code Stroke. 2300 went to restroom as she's had urinary frequency and ~2315 was found with increased weakness of right arm and leg. Slow slurred speech and alert/ oriented x 3. Recently started Plavix  after a TIA. FINDINGS: LUNGS AND PLEURA: Mild increased interstitial markings, chronic, without frank interstitial edema. No focal pulmonary opacity. No pleural effusion. No pneumothorax. HEART AND MEDIASTINUM: No acute abnormality of the cardiac and mediastinal silhouettes. Thoracic aortic atherosclerosis. BONES AND SOFT TISSUES: No acute osseous abnormality. IMPRESSION: 1. No acute cardiopulmonary process. Electronically signed by: Pinkie Pebbles MD 10/27/2023 12:42 AM EDT RP Workstation: [id number]   CT HEAD CODE STROKE WO CONTRAST Result Date: 10/27/2023 CLINICAL DATA:  Code stroke.  Neuro deficit, acute, stroke suspected EXAM: CT HEAD WITHOUT CONTRAST TECHNIQUE: Contiguous axial images were obtained from the base of the skull through the vertex without intravenous contrast. RADIATION DOSE REDUCTION: This exam was performed according to the departmental dose-optimization program which includes automated exposure control, adjustment of the mA and/or kV according to patient size and/or use of iterative reconstruction technique. COMPARISON:  CT head 09/29/2023. FINDINGS: Brain: No evidence of acute  infarction, hemorrhage, hydrocephalus, extra-axial collection or mass lesion/mass effect. Moderate patchy white matter hypodensities are nonspecific but compatible with chronic microvascular disease that appears similar to prior. Vascular: No hyperdense vessel. Skull: No acute fracture. 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 ASPECTS [facility] Stroke Program Early CT Score) Total score (0-10 with 10 being normal): 10. IMPRESSION: 1. No evidence of acute intracranial abnormality.  ASPECTS is 10. 2. 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 Recommend correlation with direct inspection. Code stroke imaging results were communicated on 10/27/2023 at 12:31 am to provider Dr. Vanessa via secure text paging. Electronically Signed   By: Gilmore GORMAN Molt M.D.   On: 10/27/2023 00:32   MM 3D SCREENING MAMMOGRAM BILATERAL BREAST Result Date: 10/20/2023 CLINICAL DATA:  Screening. EXAM: DIGITAL SCREENING BILATERAL MAMMOGRAM WITH TOMOSYNTHESIS AND CAD TECHNIQUE: Bilateral screening digital craniocaudal and mediolateral oblique mammograms were obtained. Bilateral screening digital breast tomosynthesis was performed. The images were evaluated with computer-aided detection. COMPARISON:  Previous exam(s). ACR Breast Density Category c: The breasts are heterogeneously dense, which may obscure small masses. FINDINGS: There are no findings suspicious for malignancy. IMPRESSION: No mammographic evidence of malignancy. A result letter of this screening mammogram will be mailed directly to the patient. RECOMMENDATION: Screening mammogram in one year. (Code:SM-B-01Y) BI-RADS CATEGORY  1: Negative. Electronically Signed   By: Debby Satterfield M.D.   On: 10/20/2023 13:02    Assessment/Plan  1. Severe sepsis (HCC) (Primary) -  Recently treated for sepsis secondary to a urinary tract infection. Completed Augmentin  with dosing error. Concerns about residual infection. - Perform urinalysis and urine culture to ensure  resolution of infection. - POCT Urinalysis Dip Manual -showed moderate leukocyte, moderate blood and negative nitrate - Urine Culture  2. Essential hypertension -  Blood pressure well-controlled at 132/68 mmHg on amlodipine  5 mg daily.  3. History of CVA (cerebrovascular accident) -  On Plavix  and rosuvastatin  post-TIA. - Follow up with neurologist. - Continue Plavix  75 mg daily. -  Continue rosuvastatin  10 mg daily. - clopidogrel  (PLAVIX ) 75 MG tablet; Take 1 tablet (75 mg total) by mouth daily.  Dispense: 90 tablet; Refill: 0  4. Iron deficiency anemia, unspecified iron deficiency anemia type -  with hemoglobin at 10.9 g/dL. Iron supplementation recommended. - Start ferrous sulfate 325 mg once daily. - Increase dietary intake of vegetables to prevent constipation. - Recheck CBC in one month. - Iron, Ferrous Sulfate, 325 (65 Fe) MG TABS; Take 325 mg by mouth daily.  Dispense: 30 tablet; Refill: 1  5. Generalized weakness -  likely related to recent hospitalization and underlying medical conditions. - Continue physical therapy. - Arrange for home health aid to assist with daily activities. - Ambulatory referral to Home Health  6. Insomnia, unspecified type -  with approximately 4 hours of sleep per night. Baclofen  and magnesium  used to aid sleep. - Continue baclofen  at night. - Continue magnesium  supplementation. - melatonin 5 MG TABS; Take 1 tablet (5 mg total) by mouth at bedtime  Dispense: 30 tablet; Refill: 3 - Magnesium  Bisglycinate 100 MG TABS; Take 200 mg by mouth at bedtime.  Dispense: 180 tablet; Refill: 1 - Vitamin D-Vitamin K (VITAMIN K2-VITAMIN D3) 90-125 MCG CAPS; Take 1 capsule by mouth daily.  Dispense: 90 capsule; Refill: 1  7. Alcohol use -  Mild alcohol use, not currently problematic. Discussed potential risks, especially with anticoagulation therapy. - Advise moderation in alcohol consumption.   Labs/tests ordered:   POC urinalysis dipstick, urine  culture   Return in about 4 weeks (around 12/03/2023).  Chanson Teems Medina-Vargas, NP

## 2023-11-05 NOTE — Telephone Encounter (Signed)
 Copied from CRM [id number]. Topic: Clinical - Home Health Verbal Orders >> Nov 05, 2023  2:46 PM Miquel SAILOR wrote: Caller/Agency: [facility]  Callback Number: [phone] Service Requested: Occupational Therapy Frequency: 1 4 weeks  Any new concerns about the patient? No

## 2023-11-06 ENCOUNTER — Other Ambulatory Visit: Payer: Self-pay | Admitting: Nurse Practitioner

## 2023-11-06 ENCOUNTER — Ambulatory Visit: Payer: Self-pay | Admitting: Adult Health

## 2023-11-06 LAB — URINE CULTURE
MICRO NUMBER:: 17049455
Result:: NO GROWTH
SPECIMEN QUALITY:: ADEQUATE

## 2023-11-06 MED ORDER — ROSUVASTATIN CALCIUM 10 MG PO TABS: 10.0000 mg | ORAL_TABLET | Freq: Every day | ORAL | 0 refills | Status: DC

## 2023-11-06 NOTE — Progress Notes (Signed)
 Urine culture negative for UTI.

## 2023-11-06 NOTE — Telephone Encounter (Signed)
 Copied from CRM [id number]. Topic: Clinical - Medication Refill >> Nov 06, 2023  9:00 AM Fredrica W wrote: Medication: rosuvastatin  (CRESTOR ) 10 MG tablet  Has the patient contacted their pharmacy? Yes (Agent: If no, request that the patient contact the pharmacy for the refill. If patient does not wish to contact the pharmacy document the reason why and proceed with request.) (Agent: If yes, when and what did the pharmacy advise?)  This is the patient's preferred pharmacy:  [id number] [place], [place] - [address] [place] [address] Phone: [phone] Fax: [phone]   Is this the correct pharmacy for this prescription? Yes If no, delete pharmacy and type the correct one.   Has the prescription been filled recently? Yes 8/27 in hospital different provider  Is the patient out of the medication? Yes  Has the patient been seen for an appointment in the last year OR does the patient have an upcoming appointment? Yes yesterday - requested wrong medication at appt   Can we respond through MyChart? No  Agent: Please be advised that Rx refills may take up to 3 business days. We ask that you follow-up with your pharmacy.

## 2023-11-09 ENCOUNTER — Other Ambulatory Visit (HOSPITAL_COMMUNITY): Payer: Self-pay

## 2023-11-09 ENCOUNTER — Ambulatory Visit: Admitting: Adult Health

## 2023-11-11 ENCOUNTER — Telehealth: Payer: Self-pay | Admitting: Nurse Practitioner

## 2023-11-11 DIAGNOSIS — R252 Cramp and spasm: Secondary | ICD-10-CM

## 2023-11-11 NOTE — Telephone Encounter (Signed)
 Copied from CRM [id number]. Topic: Clinical - Lab/Test Results >> Nov 11, 2023  3:29 PM Zane F wrote: Reason for CRM:   Caller: [name] Regan (Patient's daughter)   Patient's daughter called in to get the results from the patient's urinalysis. Results were relayed and there are no further questions at this time.   Callback Number: [phone]

## 2023-11-11 NOTE — Telephone Encounter (Signed)
 Patient daughter Michelle Hodges called and notified. Patient daughter also wanted to know what could be done about Baclofen  from previous message below. Patient was seen last by Jereld Delude, NP and this was discussed. They are [facility] [place]. Message routed to both covering provider Roxan Plough, NP and last seen provider Jereld Delude, NP.

## 2023-11-11 NOTE — Telephone Encounter (Signed)
 Magnesium  bisglycinate is moderately absorbable in the body then magnesium  glycinate.Okay to take magnesium  bisglycinate.

## 2023-11-11 NOTE — Telephone Encounter (Signed)
 Message routed to covering provider Roxan Plough, NP

## 2023-11-11 NOTE — Telephone Encounter (Signed)
 Copied from CRM [id number]. Topic: Clinical - Medication Question >> Nov 11, 2023  3:30 PM Brittney F wrote: Reason for CRM:   Prescription In Question: Magnesium  Bisglycinate 100 MG TABS   Preferred Pharmacy: [id number] [place], [place] - [address], [place] [address] Phone: [phone]  Fax: [phone] DEA #: [id number]    Patient's daughter is calling in to inquire if the prescription listed above is the same as the prescription given to the patient at the pharmacy. The patient's daughter realized after picking up the patient's prescription that the prescription she received was Magnesium  Glyncinate 100 MG TABS instead of the prescribed  Magnesium  Bisglycinate 100 MG TABS. She would like to confirm if the prescription is ok for the patient to take and would like a callback to confirm.   Callback Number: [phone]

## 2023-11-11 NOTE — Telephone Encounter (Signed)
 Please know baclofen  listed on current medication list. On last visit Monina hide recommended to continue baclofen  at bedtime.  How often do you take baclofen  5 mg Tablet ?

## 2023-11-11 NOTE — Telephone Encounter (Unsigned)
 Copied from CRM [id number]. Topic: Clinical - Medication Refill >> Nov 11, 2023  3:36 PM Zane F wrote: Caller: [name] Regan  Concern: Patient's daughter is calling in the following prescription previously prescribed by ER and now needed for regular use for muscle spasms. The patient is not out of the prescription but looking to have it ordered as a proactive measure. Patient has 3 pills left. The patient only takes one at night.  Medication: Baclofen  5 MG TABS  Has the patient contacted their pharmacy? Yes  This is the patient's preferred pharmacy:   [facility] [address] [place] [address] Phone: [phone]  Fax: [phone]   Is this the correct pharmacy for this prescription? Yes   Has the prescription been filled recently? No  Is the patient out of the medication? No  Has the patient been seen for an appointment in the last year OR does the patient have an upcoming appointment? Yes  Can we respond through MyChart? Yes  Agent: Please be advised that Rx refills may take up to 3 business days. We ask that you follow-up with your pharmacy.

## 2023-11-12 MED ORDER — BACLOFEN 5 MG PO TABS: 5.0000 mg | ORAL_TABLET | Freq: Every day | ORAL | 5 refills | Status: DC

## 2023-11-12 NOTE — Telephone Encounter (Signed)
 Baclofen refilled.

## 2023-11-12 NOTE — Telephone Encounter (Signed)
 Patient Takes Baclofen  5mg  at bedtime.  Pended Rx for approval.

## 2023-11-12 NOTE — Telephone Encounter (Signed)
 Message routed to clinical intake for today

## 2023-11-13 ENCOUNTER — Telehealth: Payer: Self-pay

## 2023-11-13 NOTE — Transitions of Care (Post Inpatient/ED Visit) (Signed)
 11/13/2023  - Call placed to number [phone] as requested - Left VM  Patient ID: Michelle Hodges, female   DOB: July 12, 1945, 78 y.o.   MRN: [id number] Shona Prow RN, CCM [place]  VBCI-Population Health RN Care Manager [phone]

## 2023-11-17 ENCOUNTER — Ambulatory Visit: Payer: Self-pay

## 2023-11-17 NOTE — Telephone Encounter (Signed)
 Patient's daughter called to report episodes of diarrhea along with concerns that patient is having similar symptoms to her last UTI. Per daughter, patient is saying that her urine is gushing out.  Daughter is wanting to get her seen due to both symptoms. Patient had a recent history of antibiotics.  Patient is scheduled for an acute visit tomorrow at 10:40 AM  FYI Only or Action Required?: FYI only for provider.  Patient was last seen in primary care on 11/05/2023 by Medina-Vargas, Jereld BROCKS, NP.  Called Nurse Triage reporting Diarrhea.  Symptoms began today.  Interventions attempted: Rest, hydration, or home remedies.  Symptoms are: unchanged.  Triage Disposition: See Physician Within 24 Hours  Patient/caregiver understands and will follow disposition?: Yes  Copied from CRM [id number]. Topic: Clinical - Red Word Triage >> Nov 17, 2023  2:07 PM [name] wrote: Red Word that prompted transfer to Nurse Triage: Patients daughter Michelle is calling in with the patient stating that when the patient wiped she had blood, patient was not feeling well all morning as well with diarrhea. Reason for Disposition  [1] Recent antibiotic therapy (i.e., within last 2 months) AND [2] diarrhea present > 3 days since antibiotic was stopped  Answer Assessment - Initial Assessment Questions 1. DIARRHEA SEVERITY: How bad is the diarrhea? How many more stools have you had in the past 24 hours than normal?      1 episode 2. ONSET: When did the diarrhea begin?      today 3. STOOL DESCRIPTION:  How loose or watery is the diarrhea? What is the stool color? Is there any blood or mucous in the stool?     loose 4. VOMITING: Are you also vomiting? If Yes, ask: How many times in the past 24 hours?      no 5. ABDOMEN PAIN: Are you having any abdomen pain? If Yes, ask: What does it feel like? (e.g., crampy, dull, intermittent, constant)      no 6. ABDOMEN PAIN SEVERITY: If present, ask: How bad  is the pain?  (e.g., Scale 1-10; mild, moderate, or severe)     NA 7. ORAL INTAKE: If vomiting, Have you been able to drink liquids? How much liquids have you had in the past 24 hours?     Drinking okay 8. HYDRATION: Any signs of dehydration? (e.g., dry mouth [not just dry lips], too weak to stand, dizziness, new weight loss) When did you last urinate?     No signs of dehydration 9. EXPOSURE: Have you traveled to a foreign country recently? Have you been exposed to anyone with diarrhea? Could you have eaten any food that was spoiled?     no 10. ANTIBIOTIC USE: Are you taking antibiotics now or have you taken antibiotics in the past 2 months?       yes 11. OTHER SYMPTOMS: Do you have any other symptoms? (e.g., fever, blood in stool)       Had blood on toilet paper when she wiped,  Protocols used: Diarrhea-A-AH

## 2023-11-17 NOTE — Telephone Encounter (Signed)
 Please see Triage Notes from Triage Nurse [facility]

## 2023-11-18 ENCOUNTER — Encounter: Payer: Self-pay | Admitting: Sports Medicine

## 2023-11-18 ENCOUNTER — Ambulatory Visit (INDEPENDENT_AMBULATORY_CARE_PROVIDER_SITE_OTHER): Admitting: Sports Medicine

## 2023-11-18 ENCOUNTER — Ambulatory Visit: Admitting: Sports Medicine

## 2023-11-18 VITALS — BP 118/76 | HR 84 | Temp 98.2°F | Ht 69.0 in | Wt 118.0 lb

## 2023-11-18 DIAGNOSIS — R319 Hematuria, unspecified: Secondary | ICD-10-CM | POA: Diagnosis not present

## 2023-11-18 DIAGNOSIS — D509 Iron deficiency anemia, unspecified: Secondary | ICD-10-CM | POA: Diagnosis not present

## 2023-11-18 DIAGNOSIS — K921 Melena: Secondary | ICD-10-CM | POA: Diagnosis not present

## 2023-11-18 DIAGNOSIS — R252 Cramp and spasm: Secondary | ICD-10-CM

## 2023-11-18 LAB — POC HEMOCCULT BLD/STL (OFFICE/1-CARD/DIAGNOSTIC): Fecal Occult Blood, POC: POSITIVE — AB

## 2023-11-18 MED ORDER — BACLOFEN 5 MG PO TABS: 5.0000 mg | ORAL_TABLET | Freq: Every day | ORAL | 5 refills | Status: AC

## 2023-11-18 NOTE — Progress Notes (Signed)
 Careteam: Patient Care Team: Caro Harlene POUR, NP as PCP - General (Geriatric Medicine)  PLACE OF SERVICE:  [facility]  Advanced Directive information    No Known Allergies  Chief Complaint  Patient presents with   Acute Visit    Patient's daughter stated that patient was seen a couple of weeks ago by [facility] and was put on iron pills and MiraLax . Patients daughter stated that patient had wiped and there was blood as if she had started a cycle but not too much blood.      Discussed the use of AI scribe software for clinical note transcription with the patient, who gave verbal consent to proceed.  History of Present Illness  Michelle Busey Hodges Michelle Hodges is a 78 year old female who presents with blood noticed on toilet tissue after urination.  She noticed blood on toilet tissue after urination, first observed the day before the visit.  The blood was not observed in the diaper or commode, but a hint of blood was noted on the diaper during a change. She did not notice any blood mixed with urine.  No abdominal pain, nausea, vomiting, dizziness, or changes in breathing. Her appetite has been limited to bland foods such as chicken broth and rice. She has not had a colonoscopy, although she was due for one last year. Her stools have turned dark since starting iron pills.  Her current medications include iron pills, which she started recently.    Review of Systems:  Review of Systems  Constitutional:  Negative for chills and fever.  HENT:  Negative for congestion and sore throat.   Respiratory:  Negative for cough, sputum production and shortness of breath.   Cardiovascular:  Negative for chest pain, palpitations and leg swelling.  Gastrointestinal:  Negative for abdominal pain, heartburn and nausea.  Neurological:  Negative for dizziness.   Negative unless indicated in HPI.   Past Medical History:  Diagnosis Date   History of blood transfusion    Hypertension    MS (multiple  sclerosis)    Senile dementia (HCC)    Past Surgical History:  Procedure Laterality Date   COLONOSCOPY N/A 04/20/2012   Procedure: COLONOSCOPY;  Surgeon: Gordy CHRISTELLA Starch, MD;  Location: [facility] ENDOSCOPY;  Service: Gastroenterology;  Laterality: N/A;   CYSTECTOMY     in HS   TONSILLECTOMY     Social History:   reports that she quit smoking about 39 years ago. Her smoking use included cigarettes. She started smoking about 54 years ago. She has a 3.8 pack-year smoking history. She has never used smokeless tobacco. She reports current alcohol use. She reports current drug use. Drug: Marijuana.  Family History  Problem Relation Age of Onset   Hypertension Mother    Breast cancer Mother 56   Hypertension Father    Stroke Father    Hypertension Sister    Stroke Sister    Breast cancer Sister    Hypertension Brother    Cancer - Other Maternal Grandmother        stomach   Multiple sclerosis Cousin    Colon cancer Neg Hx    Rectal cancer Neg Hx     Medications: Patient's Medications  New Prescriptions   No medications on file  Previous Medications   ACETAMINOPHEN  (TYLENOL ) 500 MG TABLET    Take 500 mg by mouth every 6 (six) hours as needed for mild pain (pain score 1-3) or headache.   AMLODIPINE  (NORVASC ) 5 MG TABLET  TAKE 1 TABLET (5 MG TOTAL) BY MOUTH DAILY.   AMOXICILLIN -CLAVULANATE (AUGMENTIN ) 875-125 MG TABLET    Take 1 tablet by mouth every 12 (twelve) hours.   CLOPIDOGREL  (PLAVIX ) 75 MG TABLET    Take 1 tablet (75 mg total) by mouth daily.   HOMEOPATHIC PRODUCTS EX    Apply 1 Application topically as needed (joint and arthritic pain). Mixture of clove oil and jojoba oil, and emollient .   IRON, FERROUS SULFATE, 325 (65 FE) MG TABS    Take 325 mg by mouth daily.   MAGNESIUM  BISGLYCINATE 100 MG TABS    Take 200 mg by mouth at bedtime.   MELATONIN 5 MG TABS    Take 1 tablet (5 mg total) by mouth at bedtime.   POLYETHYLENE GLYCOL POWDER (GLYCOLAX /MIRALAX ) 17 GM/SCOOP POWDER    Take 17  g by mouth daily. Dissolve 1 capful (17g) in 4-8 ounces of liquid and take by mouth daily.   ROSUVASTATIN  (CRESTOR ) 10 MG TABLET    Take 1 tablet (10 mg total) by mouth daily.   VITAMIN D-VITAMIN K (VITAMIN K2-VITAMIN D3) 90-125 MCG CAPS    Take 1 capsule by mouth daily.  Modified Medications   Modified Medication Previous Medication   BACLOFEN  5 MG TABS Baclofen  5 MG TABS      Take 1 tablet (5 mg total) by mouth at bedtime.    Take 1 tablet (5 mg total) by mouth at bedtime.  Discontinued Medications   No medications on file    Physical Exam: Vitals:   11/18/23 1423  BP: 118/76  Pulse: 84  Temp: 98.2 F (36.8 C)  SpO2: 96%  Weight: 118 lb (53.5 kg)  Height: 5' 9 (1.753 m)   Body mass index is 17.43 kg/m. BP Readings from Last 3 Encounters:  11/18/23 118/76  11/05/23 132/68  10/29/23 (!) 148/94   Wt Readings from Last 3 Encounters:  11/18/23 118 lb (53.5 kg)  11/05/23 116 lb 6.4 oz (52.8 kg)  10/27/23 130 lb (59 kg)    Physical Exam Constitutional:      Appearance: Normal appearance.  HENT:     Head: Normocephalic and atraumatic.  Cardiovascular:     Rate and Rhythm: Normal rate and regular rhythm.  Pulmonary:     Effort: Pulmonary effort is normal. No respiratory distress.     Breath sounds: Normal breath sounds. No wheezing.  Abdominal:     General: Bowel sounds are normal. There is no distension.     Tenderness: There is no abdominal tenderness. There is no guarding or rebound.     Comments:    Genitourinary:    Comments: Guaiac positive  Musculoskeletal:        General: No swelling.  Neurological:     Mental Status: She is alert. Mental status is at baseline.     Motor: No weakness.     Labs reviewed: Basic Metabolic Panel: Recent Labs    10/27/23 0500 10/28/23 0526 10/29/23 0631  NA 140 142 143  K 3.6 4.0 3.8  CL 108 109 107  CO2 23 25 26   GLUCOSE 121* 105* 95  BUN 12 14 14   CREATININE 0.89 0.99 0.89  CALCIUM  8.7* 8.9 9.3  MG 2.0  --   2.1  PHOS  --   --  3.7   Liver Function Tests: Recent Labs    12/31/22 0857 09/29/23 0732 10/27/23 0019  AST 23 35 27  ALT 16 19 17   ALKPHOS  --  53  77  BILITOT 0.8 1.3* 0.7  PROT 7.2 6.9 5.8*  ALBUMIN  --  3.5 3.2*   No results for input(s): LIPASE, AMYLASE in the last 8760 hours. No results for input(s): AMMONIA in the last 8760 hours. CBC: Recent Labs    09/30/23 1034 10/27/23 0019 10/27/23 0500 10/28/23 0526 10/29/23 0631  WBC 21.5* 16.2* 15.9* 12.6* 9.1  NEUTROABS 18.2* 14.1* 13.5*  --   --   HGB 13.0 10.9*  11.2* 9.8* 10.4* 10.9*  HCT 39.9 34.3*  33.0* 30.5* 31.8* 34.0*  MCV 93.0 93.2 91.3 90.9 91.9  PLT 193 283 275 265 297   Lipid Panel: Recent Labs    12/31/22 0857 09/30/23 1034  CHOL 149 172  HDL 62 86  LDLCALC 72 76  TRIG 72 48  CHOLHDL 2.4 2.0   TSH: No results for input(s): TSH in the last 8760 hours. A1C: Lab Results  Component Value Date   HGBA1C 5.4 09/29/2023    Assessment and Plan Assessment & Plan  Blood in stool (Primary) Daughter reported that she noticed some blood on the diaper ,  Did not notice blood in the urine Will check complete urinalysis  Guaiac positive No active bleeding  - Ambulatory referral to Gastroenterology  Iron deficiency anemia, unspecified iron deficiency anemia type Will refer to GI  Cont with iron pills

## 2023-11-23 ENCOUNTER — Encounter: Payer: Self-pay | Admitting: Sports Medicine

## 2023-11-23 ENCOUNTER — Telehealth: Payer: Self-pay | Admitting: Sports Medicine

## 2023-11-25 NOTE — Telephone Encounter (Signed)
 Michelle Madden, MD to [facility]  (Selected Message)     11/25/23 11:42 AM Just checking to see if you contacted pt regarding urinalysis  Plz see my note from 11/23/23 Michelle Madden, MD to [facility]     11/23/23 10:20 AM Can you call the daughter and inform that we will check urinalysis too to rule out if she is having blood in the urine, urinalysis ordered.   Scheduled appointment for lab on Friday (earliest daughter could bring patient)

## 2023-11-27 ENCOUNTER — Other Ambulatory Visit

## 2023-11-27 NOTE — Telephone Encounter (Signed)
 Left a detail voicemail in regards to Sherlynn Madden, MD from 11/23/2023. Please Sherlynn Madden, MD  previous notes.

## 2023-11-27 NOTE — Telephone Encounter (Signed)
 Rona Rake (daughter), is calling, asking if they can pick up the urine specimen cup instead of having to bring the patient.   Please advise.  Requesting a callback today, as the patient has a lab appointment at 1:30 PM.

## 2023-11-29 ENCOUNTER — Other Ambulatory Visit: Payer: Self-pay | Admitting: Nurse Practitioner

## 2023-12-01 ENCOUNTER — Telehealth: Payer: Self-pay

## 2023-12-01 DIAGNOSIS — Z8673 Personal history of transient ischemic attack (TIA), and cerebral infarction without residual deficits: Secondary | ICD-10-CM

## 2023-12-01 DIAGNOSIS — R531 Weakness: Secondary | ICD-10-CM

## 2023-12-01 DIAGNOSIS — A419 Sepsis, unspecified organism: Secondary | ICD-10-CM

## 2023-12-01 NOTE — Telephone Encounter (Signed)
 Copied from CRM #[id number]. Topic: Clinical - Home Health Verbal Orders >> Dec 01, 2023  3:11 PM DeAngela L wrote: Caller/Agency: [facility]  Callback Number: [phone] secured line Jerel call back num for any questions  Service Requested: Physical Therapy Any new concerns about the patient? No  Can the patients provider send an order over to [facility] cause the patient is being discharged from [facility] this week  and the patient would like to continue outpatient therapy so she can continue to work on her balance and overall strength   [facility] at [facility]  [address] [place],  [place]  [address]  Fax: [phone] Main: [phone]

## 2023-12-03 ENCOUNTER — Ambulatory Visit: Admitting: Adult Health

## 2023-12-03 NOTE — Telephone Encounter (Signed)
 Order placed

## 2023-12-04 NOTE — Telephone Encounter (Signed)
 Left patient daughter a voicemail regarding referral being placed. Informed her if she had any questions or concerns to contact us .

## 2023-12-07 ENCOUNTER — Telehealth: Payer: Self-pay

## 2023-12-07 NOTE — Telephone Encounter (Signed)
 Copied from CRM [id number]. Topic: Clinical - Home Health Verbal Orders >> Dec 07, 2023  2:03 PM Graeme ORN wrote: Caller/Agency: [facility] Callback Number: [phone] Service Requested: Occupational Therapy Frequency: 1 time per week for 4 weeks  Any new concerns about the patient? No

## 2023-12-07 NOTE — Telephone Encounter (Signed)
 Call returned to Michelle Hod[facility] Agency and verbal orders authorized per [employer] standing order

## 2023-12-16 DIAGNOSIS — I10 Essential (primary) hypertension: Secondary | ICD-10-CM

## 2023-12-16 DIAGNOSIS — A4151 Sepsis due to Escherichia coli [E. coli]: Secondary | ICD-10-CM | POA: Diagnosis not present

## 2023-12-16 DIAGNOSIS — Z87891 Personal history of nicotine dependence: Secondary | ICD-10-CM

## 2023-12-16 DIAGNOSIS — Z7982 Long term (current) use of aspirin: Secondary | ICD-10-CM

## 2023-12-16 DIAGNOSIS — I69351 Hemiplegia and hemiparesis following cerebral infarction affecting right dominant side: Secondary | ICD-10-CM | POA: Diagnosis not present

## 2023-12-16 DIAGNOSIS — Z556 Problems related to health literacy: Secondary | ICD-10-CM

## 2023-12-16 DIAGNOSIS — N3000 Acute cystitis without hematuria: Secondary | ICD-10-CM

## 2023-12-16 DIAGNOSIS — Z7902 Long term (current) use of antithrombotics/antiplatelets: Secondary | ICD-10-CM

## 2023-12-16 DIAGNOSIS — F0390 Unspecified dementia without behavioral disturbance: Secondary | ICD-10-CM | POA: Diagnosis not present

## 2023-12-16 DIAGNOSIS — R6520 Severe sepsis without septic shock: Secondary | ICD-10-CM | POA: Diagnosis not present

## 2023-12-17 ENCOUNTER — Encounter: Payer: Self-pay | Admitting: Adult Health

## 2023-12-17 NOTE — Progress Notes (Signed)
 This encounter was created in error - please disregard.

## 2023-12-20 ENCOUNTER — Other Ambulatory Visit: Payer: Self-pay | Admitting: Nurse Practitioner

## 2023-12-20 DIAGNOSIS — I10 Essential (primary) hypertension: Secondary | ICD-10-CM

## 2023-12-22 ENCOUNTER — Ambulatory Visit: Admitting: Adult Health

## 2024-01-06 DIAGNOSIS — Z7902 Long term (current) use of antithrombotics/antiplatelets: Secondary | ICD-10-CM

## 2024-01-06 DIAGNOSIS — Z87891 Personal history of nicotine dependence: Secondary | ICD-10-CM

## 2024-01-06 DIAGNOSIS — I10 Essential (primary) hypertension: Secondary | ICD-10-CM

## 2024-01-06 DIAGNOSIS — N3000 Acute cystitis without hematuria: Secondary | ICD-10-CM | POA: Diagnosis not present

## 2024-01-06 DIAGNOSIS — Z556 Problems related to health literacy: Secondary | ICD-10-CM

## 2024-01-06 DIAGNOSIS — R6520 Severe sepsis without septic shock: Secondary | ICD-10-CM | POA: Diagnosis not present

## 2024-01-06 DIAGNOSIS — I739 Peripheral vascular disease, unspecified: Secondary | ICD-10-CM

## 2024-01-06 DIAGNOSIS — I69351 Hemiplegia and hemiparesis following cerebral infarction affecting right dominant side: Secondary | ICD-10-CM

## 2024-01-06 DIAGNOSIS — F0390 Unspecified dementia without behavioral disturbance: Secondary | ICD-10-CM | POA: Diagnosis not present

## 2024-01-06 DIAGNOSIS — G35D Multiple sclerosis, unspecified: Secondary | ICD-10-CM

## 2024-01-06 DIAGNOSIS — Z7982 Long term (current) use of aspirin: Secondary | ICD-10-CM

## 2024-01-06 DIAGNOSIS — A4151 Sepsis due to Escherichia coli [E. coli]: Secondary | ICD-10-CM | POA: Diagnosis not present

## 2024-02-02 ENCOUNTER — Other Ambulatory Visit

## 2024-02-03 ENCOUNTER — Ambulatory Visit: Payer: Self-pay

## 2024-02-03 NOTE — Telephone Encounter (Signed)
 Daughter requesting sooner appt for routine labs, states pt missed her previous appts.   FYI Only or Action Required?: FYI only for provider: UC.  Patient was last seen in primary care on 11/18/2023 by Sherlynn Madden, MD.  Called Nurse Triage reporting Cough.  Symptoms began several days ago.  Interventions attempted: OTC medications: coricidin.  Symptoms are: gradually improving.  Triage Disposition: See Today in Office (overriding Home Care)  Patient/caregiver understands and will follow disposition?: Yes  Reason for Disposition  Cough  Answer Assessment - Initial Assessment Questions Pts daughter reports pt had a low grade fever one week ago and took coricidin. Fever has resolved, but now the patient is experiencing a productive cough x 5-7 days. Sputum is clear. Pt and daughter were provided with closest UC locations as no in office appts available.  1. ONSET: When did the cough begin?      5-7 days 2. SEVERITY: How bad is the cough today?      States improving slowly 3. SPUTUM: Describe the color of your sputum (e.g., none, dry cough; clear, white, yellow, green)     Clear 5. DIFFICULTY BREATHING: Are you having difficulty breathing? If Yes, ask: How bad is it? (e.g., mild, moderate, severe)      Denies 6. FEVER: Do you have a fever? If Yes, ask: What is your temperature, how was it measured, and when did it start?     Fever initially, has resolved 7. CARDIAC HISTORY: Do you have any history of heart disease? (e.g., heart attack, congestive heart failure)      HTN 8. LUNG HISTORY: Do you have any history of lung disease?  (e.g., pulmonary embolus, asthma, emphysema)     Not listed  Protocols used: Cough - Acute Productive-A-AH Copied from CRM #[id number]. Topic: Clinical - Red Word Triage >> Feb 03, 2024  8:58 AM Graeme ORN wrote: Red Word that prompted transfer to Nurse Triage: Productive cough with phlegm

## 2024-02-03 NOTE — Telephone Encounter (Signed)
 Per E2C2:  Pt and daughter were provided with closest UC locations as no in office appts available.

## 2024-02-25 ENCOUNTER — Telehealth: Payer: Self-pay | Admitting: Nurse Practitioner

## 2024-02-25 NOTE — Telephone Encounter (Signed)
 Copied from CRM #[id number]. Topic: Clinical - Medication Refill >> Feb 25, 2024  9:14 AM Cherylann RAMAN wrote: Medication: rosuvastatin  (CRESTOR ) 10 MG tablet  Has the patient contacted their pharmacy? Yes (Agent: If no, request that the patient contact the pharmacy for the refill. If patient does not wish to contact the pharmacy document the reason why and proceed with request.) (Agent: If yes, when and what did the pharmacy advise?)  This is the patient's preferred pharmacy:  [facility] [address] [place] [address] Phone: [phone] Fax: [phone]  Is this the correct pharmacy for this prescription? Yes If no, delete pharmacy and type the correct one.   Has the prescription been filled recently? No  Is the patient out of the medication? No  Has the patient been seen for an appointment in the last year OR does the patient have an upcoming appointment? Yes  Can we respond through MyChart? Yes  Agent: Please be advised that Rx refills may take up to 3 business days. We ask that you follow-up with your pharmacy.

## 2024-02-25 NOTE — Telephone Encounter (Signed)
 Left a voicemail for the patient daughter to call the office about the refill because patient will need to been seen in office to make an appointment because patient is out of her rosuvastatin  medication. However, patient does not have any update an appointment.  Will call back.  E2C2 it is okay to share the response with the patient. If patient has any questions or concerns please have the patient to call the office at [phone]

## 2024-02-26 ENCOUNTER — Other Ambulatory Visit (HOSPITAL_COMMUNITY): Payer: Self-pay

## 2024-02-28 ENCOUNTER — Other Ambulatory Visit: Payer: Self-pay | Admitting: Nurse Practitioner

## 2024-03-03 ENCOUNTER — Other Ambulatory Visit (HOSPITAL_COMMUNITY): Payer: Self-pay

## 2024-03-04 ENCOUNTER — Other Ambulatory Visit (HOSPITAL_COMMUNITY): Payer: Self-pay

## 2024-04-14 ENCOUNTER — Encounter: Admitting: Family Medicine
